# Patient Record
Sex: Female | Born: 1995 | Race: Black or African American | Hispanic: No | Marital: Single | Smoking: Current every day smoker
Health system: Southern US, Community
[De-identification: ages and names within clinical notes are randomized; demographics above are authoritative.]

## PROBLEM LIST (undated history)

## (undated) ENCOUNTER — Inpatient Hospital Stay (HOSPITAL_COMMUNITY): Payer: Self-pay

## (undated) DIAGNOSIS — D573 Sickle-cell trait: Secondary | ICD-10-CM

## (undated) DIAGNOSIS — N83209 Unspecified ovarian cyst, unspecified side: Secondary | ICD-10-CM

## (undated) DIAGNOSIS — D649 Anemia, unspecified: Secondary | ICD-10-CM

## (undated) DIAGNOSIS — F32A Depression, unspecified: Secondary | ICD-10-CM

## (undated) DIAGNOSIS — A549 Gonococcal infection, unspecified: Secondary | ICD-10-CM

## (undated) DIAGNOSIS — A599 Trichomoniasis, unspecified: Secondary | ICD-10-CM

## (undated) DIAGNOSIS — F329 Major depressive disorder, single episode, unspecified: Secondary | ICD-10-CM

## (undated) DIAGNOSIS — A749 Chlamydial infection, unspecified: Secondary | ICD-10-CM

## (undated) DIAGNOSIS — R519 Headache, unspecified: Secondary | ICD-10-CM

## (undated) DIAGNOSIS — N390 Urinary tract infection, site not specified: Secondary | ICD-10-CM

## (undated) HISTORY — DX: Major depressive disorder, single episode, unspecified: F32.9

## (undated) HISTORY — DX: Sickle-cell trait: D57.3

## (undated) HISTORY — DX: Depression, unspecified: F32.A

## (undated) HISTORY — DX: Chlamydial infection, unspecified: A74.9

## (undated) HISTORY — DX: Gonococcal infection, unspecified: A54.9

## (undated) HISTORY — DX: Urinary tract infection, site not specified: N39.0

## (undated) HISTORY — DX: Unspecified ovarian cyst, unspecified side: N83.209

## (undated) HISTORY — PX: NO PAST SURGERIES: SHX2092

## (undated) HISTORY — DX: Trichomoniasis, unspecified: A59.9

---
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
## 2014-03-15 ENCOUNTER — Inpatient Hospital Stay (HOSPITAL_COMMUNITY)
Admission: AD | Admit: 2014-03-15 | Discharge: 2014-03-15 | Disposition: A | Discharge status: Home / Self Care | Payer: Medicaid Other | Source: Ambulatory Visit | Attending: Family Medicine | Admitting: Family Medicine

## 2014-03-15 ENCOUNTER — Encounter (HOSPITAL_COMMUNITY): Payer: Self-pay | Admitting: *Deleted

## 2014-03-15 DIAGNOSIS — N898 Other specified noninflammatory disorders of vagina: Secondary | ICD-10-CM

## 2014-03-15 DIAGNOSIS — F329 Major depressive disorder, single episode, unspecified: Secondary | ICD-10-CM | POA: Insufficient documentation

## 2014-03-15 DIAGNOSIS — O26899 Other specified pregnancy related conditions, unspecified trimester: Secondary | ICD-10-CM

## 2014-03-15 DIAGNOSIS — O99330 Smoking (tobacco) complicating pregnancy, unspecified trimester: Secondary | ICD-10-CM | POA: Insufficient documentation

## 2014-03-15 DIAGNOSIS — O9989 Other specified diseases and conditions complicating pregnancy, childbirth and the puerperium: Principal | ICD-10-CM

## 2014-03-15 DIAGNOSIS — O99891 Other specified diseases and conditions complicating pregnancy: Secondary | ICD-10-CM | POA: Insufficient documentation

## 2014-03-15 DIAGNOSIS — O99019 Anemia complicating pregnancy, unspecified trimester: Secondary | ICD-10-CM | POA: Insufficient documentation

## 2014-03-15 DIAGNOSIS — D573 Sickle-cell trait: Secondary | ICD-10-CM | POA: Insufficient documentation

## 2014-03-15 DIAGNOSIS — F3289 Other specified depressive episodes: Secondary | ICD-10-CM | POA: Insufficient documentation

## 2014-03-15 DIAGNOSIS — O0930 Supervision of pregnancy with insufficient antenatal care, unspecified trimester: Secondary | ICD-10-CM | POA: Insufficient documentation

## 2014-03-15 LAB — URINALYSIS, ROUTINE W REFLEX MICROSCOPIC
Bilirubin Urine: NEGATIVE
Glucose, UA: NEGATIVE mg/dL
Hgb urine dipstick: NEGATIVE
Ketones, ur: NEGATIVE mg/dL
LEUKOCYTES UA: NEGATIVE
NITRITE: NEGATIVE
Protein, ur: NEGATIVE mg/dL
SPECIFIC GRAVITY, URINE: 1.02 (ref 1.005–1.030)
UROBILINOGEN UA: 0.2 mg/dL (ref 0.0–1.0)
pH: 7 (ref 5.0–8.0)

## 2014-03-15 LAB — WET PREP, GENITAL
Clue Cells Wet Prep HPF POC: NONE SEEN
TRICH WET PREP: NONE SEEN
YEAST WET PREP: NONE SEEN

## 2014-03-15 NOTE — MAU Provider Note (Signed)
  History     CSN: [id number]  Arrival date and time: 03/15/14 [id number]   First Provider Initiated Contact with Patient 03/15/14 1033      Chief Complaint  Patient presents with  . Vaginal Discharge   HPI This is a 18yo G2P1001 at 20.2 weeks by LMP.  She was [facility], but discharged due to not following up with appts.  She is seen today for concerns of the baby.  She denies contractions, bleeding, leaking fluid.  She admits to mild fetal movements.  Admits to thick, white, non-odorous vaginal discharge for several days.  Denies pelvic pain, fever, chills, nausea, vomiting.  Did break up with boyfriend for a few weeks and is now back with him.  OB History   Grav Para Term Preterm Abortions TAB SAB Ect Mult Living   2 1 1       1       Past Medical History  Diagnosis Date  . Ovarian cyst 2013  . Depression AGE 22    NO MEDS CURRENTLY  . Urinary tract infection     OCC  . Chlamydia 2010  . Gonorrhea 2010  . Sickle cell trait     Past Surgical History  Procedure Laterality Date  . No past surgeries      Family History  Problem Relation Age of Onset  . Asthma Mother   . Fibromyalgia Mother   . Asthma Brother   . Cancer Maternal Aunt     MOUTH  . Hypertension Maternal Aunt   . Heart disease Maternal Grandmother   . Hyperlipidemia Maternal Grandmother   . Hypertension Maternal Grandmother   . Arthritis Maternal Grandfather   . Diabetes Maternal Grandfather   . Hypertension Maternal Grandfather   . Kidney disease Maternal Grandfather   . Stroke Maternal Grandfather   . Diabetes Paternal Grandmother   . Birth defects Cousin     CLEFT LIP  . Cancer Cousin   . Hypertension Maternal Aunt     History  Substance Use Topics  . Smoking status: Current Every Day Smoker -- 0.25 packs/day    Types: Cigarettes  . Smokeless tobacco: Never Used  . Alcohol Use: Yes     Comment: OCC.  NONE OSINCE PREGNANT    Allergies: No Known Allergies  Prescriptions prior  to admission  Medication Sig Dispense Refill  . acetaminophen (TYLENOL) 325 MG tablet Take 650 mg by mouth every 6 (six) hours as needed.        ROS Physical Exam   Blood pressure 111/63, pulse 73, temperature 97.9 F (36.6 C), temperature source Oral, resp. rate 18, height 5' 0.25" (1.53 m), weight 49.442 kg (109 lb).  Physical Exam  Constitutional: She appears well-developed and well-nourished.  GI: Soft. She exhibits no distension and no mass. There is no tenderness. There is no rebound and no guarding.  Fundus at umbilicus  Genitourinary: Vagina normal.  Cervix normal appearing.  No CMT.  White discharge.  FHT: 143  Wet Prep: WBCs seen.  No clue cells, trichomonas, or Yeast.  MAU Course  Procedures Wet Prep and Gc/Chlamydia obtained.   MDM: I ruled out PID, Trichomonas, yeast vaginitis, BV. Assessment and Plan  1.  Vaginal discharge 2.  Insufficient prenatal care 3.  G2P1001 with IUP at 20.2 weeks  [facility]  Will need US at that appt.  GC/Chlamydia still pending.   Levie HeritageJacob J Stinson, DO 03/15/2014, 10:33 AM

## 2014-03-15 NOTE — Discharge Instructions (Signed)
Preterm Labor Information Preterm labor is when labor starts at less than 37 weeks of pregnancy. The normal length of a pregnancy is 39 to 41 weeks. CAUSES Often, there is no identifiable underlying cause as to why a woman goes into preterm labor. One of the most common known causes of preterm labor is infection. Infections of the uterus, cervix, vagina, amniotic sac, bladder, kidney, or even the lungs (pneumonia) can cause labor to start. Other suspected causes of preterm labor include:   Urogenital infections, such as yeast infections and bacterial vaginosis.   Uterine abnormalities (uterine shape, uterine septum, fibroids, or bleeding from the placenta).   A cervix that has been operated on (it may fail to stay closed).   Malformations in the fetus.   Multiple gestations (twins, triplets, and so on).   Breakage of the amniotic sac.  RISK FACTORS  Having a previous history of preterm labor.   Having premature rupture of membranes (PROM).   Having a placenta that covers the opening of the cervix (placenta previa).   Having a placenta that separates from the uterus (placental abruption).   Having a cervix that is too weak to hold the fetus in the uterus (incompetent cervix).   Having too much fluid in the amniotic sac (polyhydramnios).   Taking illegal drugs or smoking while pregnant.   Not gaining enough weight while pregnant.   Being younger than 18 and older than 19 years old.   Having a low socioeconomic status.   Being African American. SYMPTOMS Signs and symptoms of preterm labor include:   Menstrual-like cramps, abdominal pain, or back pain.  Uterine contractions that are regular, as frequent as six in an hour, regardless of their intensity (may be mild or painful).  Contractions that start on the top of the uterus and spread down to the lower abdomen and back.   A sense of increased pelvic pressure.   A watery or bloody mucus discharge that  comes from the vagina.  TREATMENT Depending on the length of the pregnancy and other circumstances, your health care provider may suggest bed rest. If necessary, there are medicines that can be given to stop contractions and to mature the fetal lungs. If labor happens before 34 weeks of pregnancy, a prolonged hospital stay may be recommended. Treatment depends on the condition of both you and the fetus.  WHAT SHOULD YOU DO IF YOU THINK YOU ARE IN PRETERM LABOR? Call your health care provider right away. You will need to go to the hospital to get checked immediately. HOW CAN YOU PREVENT PRETERM LABOR IN FUTURE PREGNANCIES? You should:   Stop smoking if you smoke.  Maintain healthy weight gain and avoid chemicals and drugs that are not necessary.  Be watchful for any type of infection.  Inform your health care provider if you have a known history of preterm labor. Document Released: 02/14/2004 Document Revised: 07/27/2013 Document Reviewed: 12/27/2012 ExitCare Patient Information 2014 [employer].    

## 2014-03-15 NOTE — MAU Note (Signed)
Vag d/c and irritation- that is why she came in . No bleeding or leaking, no pain.  Has not seen dr in 2 months- only had one visit... "has been really stressed, wasn't planning to keep preg, but was too far along"

## 2014-03-15 NOTE — MAU Note (Signed)
C/o not being able to have any prenatal care for past 2 months; wondering if she is having a boy or girl; has not had an anatomy scan; denies any pain but having some vaginal discharge; requesting a STD check;

## 2014-03-16 ENCOUNTER — Telehealth: Payer: Self-pay

## 2014-03-16 DIAGNOSIS — A749 Chlamydial infection, unspecified: Secondary | ICD-10-CM

## 2014-03-16 LAB — GC/CHLAMYDIA PROBE AMP
CT PROBE, AMP APTIMA: POSITIVE — AB
GC Probe RNA: NEGATIVE

## 2014-03-16 MED ORDER — AZITHROMYCIN 500 MG PO TABS: 1000.0000 mg | ORAL_TABLET | Freq: Every day | ORAL | Status: DC

## 2014-03-16 NOTE — Telephone Encounter (Signed)
Called pt. And informed her of Zithromax sent to her pharmacy. Advised her not to have sex until both she and her partner are treated. Also advised her to take medication on a full stomach to avoid getting sick as pt. Reports getting sick after taking medication. Pt. Verbalized understanding and has no further questions or concerns.

## 2014-03-16 NOTE — Telephone Encounter (Signed)
Message copied by Louanna RawAMPBELL, Madine Sarr M on Thu Mar 16, 2014 11:46 AM ------      Message from: Pennie BanterSMITH, MARNI W      Created: Thu Mar 16, 2014  9:36 AM       Telephone call to patient regarding positive chlamydia culture, patient notified.  Patient has not been treated and will need Rx called in per protocol to [facility] [address]  Instructed patient to notify her partner for treatment.  Report faxed to health department. ------

## 2014-04-04 ENCOUNTER — Encounter: Payer: Self-pay | Admitting: Obstetrics & Gynecology

## 2014-04-04 ENCOUNTER — Ambulatory Visit (INDEPENDENT_AMBULATORY_CARE_PROVIDER_SITE_OTHER): Payer: Medicaid Other | Admitting: Obstetrics & Gynecology

## 2014-04-04 VITALS — BP 108/70 | HR 80 | Temp 97.0°F | Wt 112.7 lb

## 2014-04-04 DIAGNOSIS — N898 Other specified noninflammatory disorders of vagina: Secondary | ICD-10-CM

## 2014-04-04 DIAGNOSIS — F1290 Cannabis use, unspecified, uncomplicated: Secondary | ICD-10-CM

## 2014-04-04 DIAGNOSIS — A5901 Trichomonal vulvovaginitis: Secondary | ICD-10-CM

## 2014-04-04 DIAGNOSIS — A749 Chlamydial infection, unspecified: Secondary | ICD-10-CM | POA: Insufficient documentation

## 2014-04-04 DIAGNOSIS — F1910 Other psychoactive substance abuse, uncomplicated: Secondary | ICD-10-CM

## 2014-04-04 DIAGNOSIS — O26892 Other specified pregnancy related conditions, second trimester: Secondary | ICD-10-CM

## 2014-04-04 DIAGNOSIS — O99320 Drug use complicating pregnancy, unspecified trimester: Secondary | ICD-10-CM

## 2014-04-04 DIAGNOSIS — O99340 Other mental disorders complicating pregnancy, unspecified trimester: Secondary | ICD-10-CM

## 2014-04-04 DIAGNOSIS — O98819 Other maternal infectious and parasitic diseases complicating pregnancy, unspecified trimester: Secondary | ICD-10-CM

## 2014-04-04 DIAGNOSIS — A599 Trichomoniasis, unspecified: Secondary | ICD-10-CM

## 2014-04-04 DIAGNOSIS — O98812 Other maternal infectious and parasitic diseases complicating pregnancy, second trimester: Secondary | ICD-10-CM

## 2014-04-04 DIAGNOSIS — O0930 Supervision of pregnancy with insufficient antenatal care, unspecified trimester: Secondary | ICD-10-CM

## 2014-04-04 DIAGNOSIS — O99330 Smoking (tobacco) complicating pregnancy, unspecified trimester: Secondary | ICD-10-CM

## 2014-04-04 DIAGNOSIS — F1210 Cannabis abuse, uncomplicated: Secondary | ICD-10-CM

## 2014-04-04 DIAGNOSIS — O9989 Other specified diseases and conditions complicating pregnancy, childbirth and the puerperium: Secondary | ICD-10-CM

## 2014-04-04 LAB — POCT URINALYSIS DIP (DEVICE)
Bilirubin Urine: NEGATIVE
Glucose, UA: NEGATIVE mg/dL
Hgb urine dipstick: NEGATIVE
Ketones, ur: NEGATIVE mg/dL
Nitrite: NEGATIVE
Protein, ur: NEGATIVE mg/dL
Specific Gravity, Urine: 1.02 (ref 1.005–1.030)
Urobilinogen, UA: 0.2 mg/dL (ref 0.0–1.0)
pH: 7 (ref 5.0–8.0)

## 2014-04-04 LAB — OB RESULTS CONSOLE GC/CHLAMYDIA
Chlamydia: NEGATIVE
Gonorrhea: NEGATIVE

## 2014-04-04 NOTE — Patient Instructions (Addendum)
Return to clinic for any obstetric concerns or go to MAU for evaluation Tetanus, Diphtheria, Pertussis (Tdap) Vaccine What You Need to Know WHY GET VACCINATED? Tetanus, diphtheria and pertussis can be very serious diseases, even for adolescents and adults. Tdap vaccine can protect us from these diseases. TETANUS (Lockjaw) causes painful muscle tightening and stiffness, usually all over the body.  It can lead to tightening of muscles in the head and neck so you can't open your mouth, swallow, or sometimes even breathe. Tetanus kills about 1 out of 5 people who are infected. DIPHTHERIA can cause a thick coating to form in the back of the throat.  It can lead to breathing problems, paralysis, heart failure, and death. PERTUSSIS (Whooping Cough) causes severe coughing spells, which can cause difficulty breathing, vomiting and disturbed sleep.  It can also lead to weight loss, incontinence, and rib fractures. Up to 2 in 100 adolescents and 5 in 100 adults with pertussis are hospitalized or have complications, which could include pneumonia and death. These diseases are caused by bacteria. Diphtheria and pertussis are spread from person to person through coughing or sneezing. Tetanus enters the body through cuts, scratches, or wounds. Before vaccines, the [place] saw as many as 200,000 cases a year of diphtheria and pertussis, and hundreds of cases of tetanus. Since vaccination began, tetanus and diphtheria have dropped by about 99% and pertussis by about 80%. TDAP VACCINE Tdap vaccine can protect adolescents and adults from tetanus, diphtheria, and pertussis. One dose of Tdap is routinely given at age 11 or 12. People who did not get Tdap at that age should get it as soon as possible. Tdap is especially important for health care professionals and anyone having close contact with a baby younger than 12 months. Pregnant women should get a dose of Tdap during every pregnancy, to protect the newborn  from pertussis. Infants are most at risk for severe, life-threatening complications from pertussis. A similar vaccine, called Td, protects from tetanus and diphtheria, but not pertussis. A Td booster should be given every 10 years. Tdap may be given as one of these boosters if you have not already gotten a dose. Tdap may also be given after a severe cut or burn to prevent tetanus infection. Your doctor can give you more information. Tdap may safely be given at the same time as other vaccines. SOME PEOPLE SHOULD NOT GET THIS VACCINE  If you ever had a life-threatening allergic reaction after a dose of any tetanus, diphtheria, or pertussis containing vaccine, OR if you have a severe allergy to any part of this vaccine, you should not get Tdap. Tell your doctor if you have any severe allergies.  If you had a coma, or long or multiple seizures within 7 days after a childhood dose of DTP or DTaP, you should not get Tdap, unless a cause other than the vaccine was found. You can still get Td.  Talk to your doctor if you:  have epilepsy or another nervous system problem,  had severe pain or swelling after any vaccine containing diphtheria, tetanus or pertussis,  ever had Guillain-Barr Syndrome (GBS),  aren't feeling well on the day the shot is scheduled. RISKS OF A VACCINE REACTION With any medicine, including vaccines, there is a chance of side effects. These are usually mild and go away on their own, but serious reactions are also possible. Brief fainting spells can follow a vaccination, leading to injuries from falling. Sitting or lying down for about 15 minutes can   help prevent these. Tell your doctor if you feel dizzy or light-headed, or have vision changes or ringing in the ears. Mild problems following Tdap (Did not interfere with activities)  Pain where the shot was given (about 3 in 4 adolescents or 2 in 3 adults)  Redness or swelling where the shot was given (about 1 person in 5)  Mild  fever of at least 100.4F (up to about 1 in 25 adolescents or 1 in 100 adults)  Headache (about 3 or 4 people in 10)  Tiredness (about 1 person in 3 or 4)  Nausea, vomiting, diarrhea, stomach ache (up to 1 in 4 adolescents or 1 in 10 adults)  Chills, body aches, sore joints, rash, swollen glands (uncommon) Moderate problems following Tdap (Interfered with activities, but did not require medical attention)  Pain where the shot was given (about 1 in 5 adolescents or 1 in 100 adults)  Redness or swelling where the shot was given (up to about 1 in 16 adolescents or 1 in 25 adults)  Fever over 102F (about 1 in 100 adolescents or 1 in 250 adults)  Headache (about 3 in 20 adolescents or 1 in 10 adults)  Nausea, vomiting, diarrhea, stomach ache (up to 1 or 3 people in 100)  Swelling of the entire arm where the shot was given (up to about 3 in 100). Severe problems following Tdap (Unable to perform usual activities, required medical attention)  Swelling, severe pain, bleeding and redness in the arm where the shot was given (rare). A severe allergic reaction could occur after any vaccine (estimated less than 1 in a million doses). WHAT IF THERE IS A SERIOUS REACTION? What should I look for?  Look for anything that concerns you, such as signs of a severe allergic reaction, very high fever, or behavior changes. Signs of a severe allergic reaction can include hives, swelling of the face and throat, difficulty breathing, a fast heartbeat, dizziness, and weakness. These would start a few minutes to a few hours after the vaccination. What should I do?  If you think it is a severe allergic reaction or other emergency that can't wait, call 9-1-1 or get the person to the nearest hospital. Otherwise, call your doctor.  Afterward, the reaction should be reported to the "Vaccine Adverse Event Reporting System" (VAERS). Your doctor might file this report, or you can do it yourself through the VAERS web  site at www.vaers.hhs.gov, or by calling 1-800-822-7967. VAERS is only for reporting reactions. They do not give medical advice.  THE NATIONAL VACCINE INJURY COMPENSATION PROGRAM The National Vaccine Injury Compensation Program (VICP) is a federal program that was created to compensate people who may have been injured by certain vaccines. Persons who believe they may have been injured by a vaccine can learn about the program and about filing a claim by calling 1-800-338-2382 or visiting the VICP website at www.hrsa.gov/vaccinecompensation. HOW CAN I LEARN MORE?  Ask your doctor.  Call your local or state health department.  Contact the Centers for Disease Control and Prevention (CDC):  Call 1-800-232-4636 or visit CDC's website at www.cdc.gov/vaccines. CDC Tdap Vaccine VIS (04/15/12) Document Released: 05/25/2012 Document Revised: 03/21/2013 Document Reviewed: 03/16/2013 ExitCare Patient Information 2014 [employer]Care, [employer].  

## 2014-04-04 NOTE — Progress Notes (Signed)
Subjective:    Julia Roman is being seen today for her first obstetrical visit.  This is not a planned pregnancy. She is at 2470w1d gestation by LMP. Her obstetrical history is significant for previous term SVD at age 18. Patient does not intend to breast feed. Pregnancy history fully reviewed. Of note, patient had a positive Chlamydia culture on 03/15/14; was treated with Azithromycin. Still reports yellow vaginal discharge, says she did not have sex with her previous partner as he did not get treated.    OB History  Gravida Para Term Preterm AB SAB TAB Ectopic Multiple Living  2 1 1       1     # Outcome Date GA Lbr Len/2nd Weight Sex Delivery Anes PTL Lv  2 CUR           1 TRM 08/11/13 7012w0d 03:23 / 00:26 4 lb 15.7 oz (2.26 kg) M SVD EPI  Y    History of Chlamydia and Trichomonas  The following portions of the patient's history were reviewed and updated as appropriate: allergies, current medications, past family history, past medical history, past social history, past surgical history and problem list.  Review of Systems Pertinent items are noted in HPI.    Objective:   BP 108/70  Pulse 80  Temp(Src) 97 F (36.1 C)  Wt 112 lb 11.2 oz (51.12 kg) GENERAL: Well-developed, well-nourished female in no acute distress.  HEENT: Normocephalic, atraumatic. Sclerae anicteric.  NECK: Supple. Normal thyroid.  LUNGS: Clear to auscultation bilaterally.  HEART: Regular rate and rhythm. BREASTS: Symmetric in size. No masses, skin changes, nipple drainage, or lymphadenopathy. ABDOMEN: Soft, gravid, nontender, nondistended. No organomegaly. PELVIC: Normal external female genitalia. Vagina is pink and rugated.  Small amount of yellow discharge. Normal multiparous cervix contour.Cultures obtained. No CMT. No adnexal mass or tenderness.  EXTREMITIES: No cyanosis, clubbing, or edema, 2+ distal pulses.   Assessment:   Pregnancy at 7270w1d Patient Active Problem List   Diagnosis Date Noted  .  Chlamydia infection complicating pregnancy in second trimester 04/04/2014  . Current smoker 02/03/2013  . Marijuana use 02/03/2013  . History of depression 02/03/2013  . Sickle cell trait 02/03/2013  . Late prenatal care 01/27/2013     Plan:   Initial labs drawn. Will follow up GC/Chlam, wet prep cultures and manage accordingly. Continue prenatal vitamins. Problem list reviewed and updated. Role of ultrasound in pregnancy discussed; fetal survey: ordered. Follow up in 4 weeks; will get third trimester labs, Tdap then Routine obstetric precautions reviewed.  Jaynie CollinsUGONNA  ANYANWU, MD, FACOG Attending Obstetrician & Gynecologist Faculty Practice, [facility]

## 2014-04-04 NOTE — Progress Notes (Signed)
C/o of yellow discharge and itching. Took Zithromax for Chlamydia treatment but states she does not think it helped.  New OB labs today. Declines flu vaccine.  New OB packet given. Appropriate weight gain discussed (25-35). Pt. Verbalized understanding.

## 2014-04-05 ENCOUNTER — Telehealth: Payer: Self-pay

## 2014-04-05 ENCOUNTER — Encounter: Payer: Self-pay | Admitting: Obstetrics & Gynecology

## 2014-04-05 DIAGNOSIS — O358XX0 Maternal care for other (suspected) fetal abnormality and damage, not applicable or unspecified: Secondary | ICD-10-CM

## 2014-04-05 DIAGNOSIS — O99330 Smoking (tobacco) complicating pregnancy, unspecified trimester: Secondary | ICD-10-CM | POA: Insufficient documentation

## 2014-04-05 DIAGNOSIS — O99320 Drug use complicating pregnancy, unspecified trimester: Secondary | ICD-10-CM | POA: Insufficient documentation

## 2014-04-05 DIAGNOSIS — A5901 Trichomonal vulvovaginitis: Secondary | ICD-10-CM | POA: Insufficient documentation

## 2014-04-05 DIAGNOSIS — O23592 Infection of other part of genital tract in pregnancy, second trimester: Secondary | ICD-10-CM

## 2014-04-05 LAB — WET PREP, GENITAL
Clue Cells Wet Prep HPF POC: NONE SEEN
YEAST WET PREP: NONE SEEN

## 2014-04-05 LAB — OBSTETRIC PANEL
ANTIBODY SCREEN: NEGATIVE
BASOS PCT: 0 % (ref 0–1)
Basophils Absolute: 0 10*3/uL (ref 0.0–0.1)
EOS ABS: 0.2 10*3/uL (ref 0.0–0.7)
EOS PCT: 2 % (ref 0–5)
HEMATOCRIT: 30.6 % — AB (ref 36.0–46.0)
HEMOGLOBIN: 10.7 g/dL — AB (ref 12.0–15.0)
Hepatitis B Surface Ag: NEGATIVE
LYMPHS ABS: 2.1 10*3/uL (ref 0.7–4.0)
Lymphocytes Relative: 25 % (ref 12–46)
MCH: 27.2 pg (ref 26.0–34.0)
MCHC: 35 g/dL (ref 30.0–36.0)
MCV: 77.7 fL — AB (ref 78.0–100.0)
MONO ABS: 0.8 10*3/uL (ref 0.1–1.0)
Monocytes Relative: 9 % (ref 3–12)
Neutro Abs: 5.4 10*3/uL (ref 1.7–7.7)
Neutrophils Relative %: 64 % (ref 43–77)
Platelets: 228 10*3/uL (ref 150–400)
RBC: 3.94 MIL/uL (ref 3.87–5.11)
RDW: 14.8 % (ref 11.5–15.5)
RH TYPE: POSITIVE
Rubella: 1.82 Index — ABNORMAL HIGH (ref <0.90)
WBC: 8.4 10*3/uL (ref 4.0–10.5)

## 2014-04-05 LAB — HIV ANTIBODY (ROUTINE TESTING W REFLEX): HIV: NONREACTIVE

## 2014-04-05 LAB — GC/CHLAMYDIA PROBE AMP
CT Probe RNA: NEGATIVE
GC Probe RNA: NEGATIVE

## 2014-04-05 MED ORDER — METRONIDAZOLE 500 MG PO TABS: ORAL_TABLET | ORAL | Status: DC

## 2014-04-05 NOTE — Telephone Encounter (Signed)
Called pt and informed pt of + trich and that flagyl has been sent to her [facility] pharmacy off of [address].  Pt informed me that medication can be harsh on her stomach.  I advised pt to take two tablets in the am and then two tablets at night on the same day and maybe that will help tolerate it better.   I advised pt to have partner(s) treated as well they can go to Urgent Care or [facility] for treatment.  I would also abstain from intercourse until you know that her partner has been treated.  Pt stated understanding.

## 2014-04-05 NOTE — Progress Notes (Signed)
Lab Addendum (from labs on 04/05/14):    Hepatitis B Surface Ag NEGATIVE    RPR NON REAC    HIV 1&2 Ab, 4th Generation NONREACTIVE    CT Probe RNA NEGATIVE    GC Probe RNA NEGATIVE    Yeast Wet Prep HPF POC NONE SEEN    Trich, Wet Prep MANY (*)   Clue Cells Wet Prep HPF POC NONE SEEN    WBC, Wet Prep HPF POC TNTC (*)  Metronidazole prescribed for Trichomonas. Patient to be contacted and will be told of results, will also need to let partner(s) know so the partner(s) can get testing and treatment. No unprotected intercourse will be advised until she and her partner(s) are treated.

## 2014-04-05 NOTE — Addendum Note (Signed)
Addended by: Jaynie CollinsANYANWU, Jessalyn Hinojosa A on: 04/05/2014 10:41 AM   Modules accepted: Orders

## 2014-04-06 LAB — PRESCRIPTION MONITORING PROFILE (19 PANEL)
Amphetamine/Meth: NEGATIVE ng/mL
Barbiturate Screen, Urine: NEGATIVE ng/mL
Benzodiazepine Screen, Urine: NEGATIVE ng/mL
Buprenorphine, Urine: NEGATIVE ng/mL
CARISOPRODOL, URINE: NEGATIVE ng/mL
COCAINE METABOLITES: NEGATIVE ng/mL
Creatinine, Urine: 189.97 mg/dL (ref >20.0)
ECSTASY: NEGATIVE ng/mL
FENTANYL URINE: NEGATIVE ng/mL
MEPERIDINE UR: NEGATIVE ng/mL
METHADONE SCREEN, URINE: NEGATIVE ng/mL
METHAQUALONE SCREEN (URINE): NEGATIVE ng/mL
NITRITES URINE, INITIAL: NEGATIVE ug/mL
Opiate Screen, Urine: NEGATIVE ng/mL
Oxycodone Screen, Ur: NEGATIVE ng/mL
PH URINE, INITIAL: 7.5 pH (ref 4.5–8.9)
PROPOXYPHENE: NEGATIVE ng/mL
Phencyclidine, Ur: NEGATIVE ng/mL
TRAMADOL UR: NEGATIVE ng/mL
Tapentadol, urine: NEGATIVE ng/mL
ZOLPIDEM, URINE: NEGATIVE ng/mL

## 2014-04-06 LAB — CULTURE, OB URINE

## 2014-04-06 LAB — CANNABANOIDS (GC/LC/MS), URINE: THC-COOH (GC/LC/MS), ur confirm: 610 ng/mL — AB

## 2014-04-07 ENCOUNTER — Ambulatory Visit (HOSPITAL_COMMUNITY)
Admission: RE | Admit: 2014-04-07 | Discharge: 2014-04-07 | Disposition: A | Discharge status: Home / Self Care | Payer: Medicaid Other | Source: Ambulatory Visit | Attending: Obstetrics & Gynecology | Admitting: Obstetrics & Gynecology

## 2014-04-07 ENCOUNTER — Ambulatory Visit (HOSPITAL_COMMUNITY): Payer: Medicaid Other

## 2014-04-07 DIAGNOSIS — Z3689 Encounter for other specified antenatal screening: Secondary | ICD-10-CM | POA: Insufficient documentation

## 2014-04-07 DIAGNOSIS — O0930 Supervision of pregnancy with insufficient antenatal care, unspecified trimester: Secondary | ICD-10-CM | POA: Insufficient documentation

## 2014-04-07 LAB — HEMOGLOBINOPATHY EVALUATION
HGB A: 61.7 % — AB (ref 96.8–97.8)
HGB F QUANT: 0 % (ref 0.0–2.0)
Hgb A2 Quant: 3.2 % (ref 2.2–3.2)
Hgb S Quant: 0 %

## 2014-04-11 ENCOUNTER — Telehealth: Payer: Self-pay

## 2014-04-11 ENCOUNTER — Encounter: Payer: Self-pay | Admitting: Obstetrics & Gynecology

## 2014-04-11 DIAGNOSIS — O358XX0 Maternal care for other (suspected) fetal abnormality and damage, not applicable or unspecified: Secondary | ICD-10-CM | POA: Insufficient documentation

## 2014-04-11 NOTE — Telephone Encounter (Signed)
Follow up US scheduled for 05/05/14 Friday at 1:15pm in [facility]. Called pt. And informed her of appointment date, time and location. No further questions or concerns.

## 2014-04-11 NOTE — Addendum Note (Signed)
Addended by: Jaynie CollinsANYANWU, UGONNA A on: 04/11/2014 11:38 AM   Modules accepted: Orders

## 2014-04-11 NOTE — Telephone Encounter (Signed)
Message copied by Louanna RawAMPBELL, Sriram Febles M on Tue Apr 11, 2014 11:42 AM ------      Message from: Jaynie CollinsANYANWU, UGONNA A      Created: Tue Apr 11, 2014 11:36 AM       04/07/14 scan showed B pyelecatsis R 6.523mm L 7mm. Schedule follow scan at [facility] in 3-4 weeks. ------

## 2014-04-11 NOTE — Telephone Encounter (Signed)
Message copied by Louanna RawAMPBELL, Jadda Hunsucker M on Tue Apr 11, 2014 11:42 AM ------      Message from: Jaynie CollinsANYANWU, UGONNA A      Created: Tue Apr 11, 2014 11:38 AM       [facility] follow up pyelectasis ------

## 2014-04-19 ENCOUNTER — Encounter (HOSPITAL_COMMUNITY): Payer: Self-pay

## 2014-04-19 ENCOUNTER — Inpatient Hospital Stay (HOSPITAL_COMMUNITY)
Admission: AD | Admit: 2014-04-19 | Discharge: 2014-04-21 | DRG: 775 | Disposition: A | Discharge status: Home / Self Care | Payer: Medicaid Other | Source: Ambulatory Visit | Attending: Obstetrics & Gynecology | Admitting: Obstetrics & Gynecology

## 2014-04-19 DIAGNOSIS — O09219 Supervision of pregnancy with history of pre-term labor, unspecified trimester: Secondary | ICD-10-CM

## 2014-04-19 DIAGNOSIS — Z8249 Family history of ischemic heart disease and other diseases of the circulatory system: Secondary | ICD-10-CM

## 2014-04-19 DIAGNOSIS — Z833 Family history of diabetes mellitus: Secondary | ICD-10-CM

## 2014-04-19 DIAGNOSIS — Z823 Family history of stroke: Secondary | ICD-10-CM

## 2014-04-19 DIAGNOSIS — O99334 Smoking (tobacco) complicating childbirth: Secondary | ICD-10-CM | POA: Diagnosis present

## 2014-04-19 DIAGNOSIS — O358XX0 Maternal care for other (suspected) fetal abnormality and damage, not applicable or unspecified: Principal | ICD-10-CM | POA: Diagnosis present

## 2014-04-19 DIAGNOSIS — O0930 Supervision of pregnancy with insufficient antenatal care, unspecified trimester: Secondary | ICD-10-CM

## 2014-04-19 DIAGNOSIS — Z8751 Personal history of pre-term labor: Secondary | ICD-10-CM

## 2014-04-19 LAB — CBC
HEMATOCRIT: 32.2 % — AB (ref 36.0–46.0)
HEMOGLOBIN: 11.6 g/dL — AB (ref 12.0–15.0)
MCH: 27.8 pg (ref 26.0–34.0)
MCHC: 36 g/dL (ref 30.0–36.0)
MCV: 77 fL — ABNORMAL LOW (ref 78.0–100.0)
Platelets: 235 10*3/uL (ref 150–400)
RBC: 4.18 MIL/uL (ref 3.87–5.11)
RDW: 13.6 % (ref 11.5–15.5)
WBC: 10.7 10*3/uL — ABNORMAL HIGH (ref 4.0–10.5)

## 2014-04-19 LAB — RPR

## 2014-04-19 LAB — GROUP B STREP BY PCR: GROUP B STREP BY PCR: POSITIVE — AB

## 2014-04-19 MED ORDER — OXYCODONE-ACETAMINOPHEN 5-325 MG PO TABS
1.0000 | ORAL_TABLET | ORAL | Status: DC | PRN
  Administered 2014-04-19: 2 via ORAL
  Filled 2014-04-19: qty 2

## 2014-04-19 MED ORDER — PRENATAL MULTIVITAMIN CH
1.0000 | ORAL_TABLET | Freq: Every day | ORAL | Status: DC
  Administered 2014-04-19 – 2014-04-21 (×3): 1 via ORAL
  Filled 2014-04-19 (×3): qty 1

## 2014-04-19 MED ORDER — SIMETHICONE 80 MG PO CHEW: 80.0000 mg | CHEWABLE_TABLET | ORAL | Status: DC | PRN

## 2014-04-19 MED ORDER — DIBUCAINE 1 % RE OINT: 1.0000 "application " | TOPICAL_OINTMENT | RECTAL | Status: DC | PRN

## 2014-04-19 MED ORDER — BETAMETHASONE SOD PHOS & ACET 6 (3-3) MG/ML IJ SUSP
12.0000 mg | Freq: Once | INTRAMUSCULAR | Status: AC
  Administered 2014-04-19: 12 mg via INTRAMUSCULAR
  Filled 2014-04-19: qty 2

## 2014-04-19 MED ORDER — WITCH HAZEL-GLYCERIN EX PADS: 1.0000 "application " | MEDICATED_PAD | CUTANEOUS | Status: DC | PRN

## 2014-04-19 MED ORDER — PNEUMOCOCCAL VAC POLYVALENT 25 MCG/0.5ML IJ INJ
0.5000 mL | INJECTION | INTRAMUSCULAR | Status: DC
Start: 2014-04-20 — End: 2014-04-21
  Filled 2014-04-19: qty 0.5

## 2014-04-19 MED ORDER — ONDANSETRON HCL 4 MG PO TABS: 4.0000 mg | ORAL_TABLET | ORAL | Status: DC | PRN

## 2014-04-19 MED ORDER — LANOLIN HYDROUS EX OINT: TOPICAL_OINTMENT | CUTANEOUS | Status: DC | PRN

## 2014-04-19 MED ORDER — IBUPROFEN 600 MG PO TABS
600.0000 mg | ORAL_TABLET | Freq: Four times a day (QID) | ORAL | Status: DC
  Administered 2014-04-19 – 2014-04-21 (×8): 600 mg via ORAL
  Filled 2014-04-19 (×8): qty 1

## 2014-04-19 MED ORDER — FENTANYL CITRATE 0.05 MG/ML IJ SOLN: 50.0000 ug | INTRAMUSCULAR | Status: DC | PRN

## 2014-04-19 MED ORDER — ONDANSETRON HCL 4 MG/2ML IJ SOLN
4.0000 mg | INTRAMUSCULAR | Status: DC | PRN
Start: 2014-04-19 — End: 2014-04-21

## 2014-04-19 MED ORDER — ONDANSETRON HCL 4 MG/2ML IJ SOLN: 4.0000 mg | Freq: Four times a day (QID) | INTRAMUSCULAR | Status: DC | PRN

## 2014-04-19 MED ORDER — TETANUS-DIPHTH-ACELL PERTUSSIS 5-2.5-18.5 LF-MCG/0.5 IM SUSP: 0.5000 mL | Freq: Once | INTRAMUSCULAR | Status: DC

## 2014-04-19 MED ORDER — BENZOCAINE-MENTHOL 20-0.5 % EX AERO: 1.0000 "application " | INHALATION_SPRAY | CUTANEOUS | Status: DC | PRN

## 2014-04-19 MED ORDER — OXYTOCIN BOLUS FROM INFUSION
500.0000 mL | INTRAVENOUS | Status: DC
  Administered 2014-04-19: 500 mL via INTRAVENOUS

## 2014-04-19 MED ORDER — OXYCODONE-ACETAMINOPHEN 5-325 MG PO TABS: 1.0000 | ORAL_TABLET | ORAL | Status: DC | PRN

## 2014-04-19 MED ORDER — MAGNESIUM SULFATE 40 G IN LACTATED RINGERS - SIMPLE
2.0000 g/h | INTRAVENOUS | Status: DC
  Filled 2014-04-19: qty 500

## 2014-04-19 MED ORDER — CITRIC ACID-SODIUM CITRATE 334-500 MG/5ML PO SOLN: 30.0000 mL | ORAL | Status: DC | PRN

## 2014-04-19 MED ORDER — LACTATED RINGERS IV SOLN: 500.0000 mL | INTRAVENOUS | Status: DC | PRN

## 2014-04-19 MED ORDER — SODIUM CHLORIDE 0.9 % IV SOLN
2.0000 g | Freq: Once | INTRAVENOUS | Status: AC
  Administered 2014-04-19: 2 g via INTRAVENOUS
  Filled 2014-04-19: qty 2000

## 2014-04-19 MED ORDER — IBUPROFEN 600 MG PO TABS: 600.0000 mg | ORAL_TABLET | Freq: Four times a day (QID) | ORAL | Status: DC | PRN

## 2014-04-19 MED ORDER — OXYTOCIN 40 UNITS IN LACTATED RINGERS INFUSION - SIMPLE MED
62.5000 mL/h | INTRAVENOUS | Status: DC
  Filled 2014-04-19: qty 1000

## 2014-04-19 MED ORDER — METRONIDAZOLE 500 MG PO TABS
2000.0000 mg | ORAL_TABLET | Freq: Once | ORAL | Status: AC
  Administered 2014-04-19: 2000 mg via ORAL
  Filled 2014-04-19: qty 4

## 2014-04-19 MED ORDER — ZOLPIDEM TARTRATE 5 MG PO TABS: 5.0000 mg | ORAL_TABLET | Freq: Every evening | ORAL | Status: DC | PRN

## 2014-04-19 MED ORDER — ACETAMINOPHEN 325 MG PO TABS
650.0000 mg | ORAL_TABLET | ORAL | Status: DC | PRN
Start: 2014-04-19 — End: 2014-04-19

## 2014-04-19 MED ORDER — LIDOCAINE HCL (PF) 1 % IJ SOLN
30.0000 mL | INTRAMUSCULAR | Status: DC | PRN
  Filled 2014-04-19: qty 30

## 2014-04-19 MED ORDER — LACTATED RINGERS IV SOLN: INTRAVENOUS | Status: DC

## 2014-04-19 MED ORDER — DIPHENHYDRAMINE HCL 25 MG PO CAPS: 25.0000 mg | ORAL_CAPSULE | Freq: Four times a day (QID) | ORAL | Status: DC | PRN

## 2014-04-19 MED ORDER — MEASLES, MUMPS & RUBELLA VAC ~~LOC~~ INJ: 0.5000 mL | INJECTION | Freq: Once | SUBCUTANEOUS | Status: DC

## 2014-04-19 MED ORDER — MAGNESIUM SULFATE BOLUS VIA INFUSION
4.0000 g | Freq: Once | INTRAVENOUS | Status: AC
  Administered 2014-04-19: 4 g via INTRAVENOUS
  Filled 2014-04-19: qty 500

## 2014-04-19 MED ORDER — SENNOSIDES-DOCUSATE SODIUM 8.6-50 MG PO TABS
2.0000 | ORAL_TABLET | ORAL | Status: DC
  Administered 2014-04-19 – 2014-04-20 (×2): 2 via ORAL
  Filled 2014-04-19 (×2): qty 2

## 2014-04-19 NOTE — H&P (Signed)
Chief Complaint:  Contractions   First Provider Initiated Contact with Patient 04/19/2014 at 0420.  HPI: Julia Roman is a 18 y.o. G2P1001 at 8528w2d who presents to maternity admissions reporting contractions since 3 AM and rectal pressure. Denies leakage of fluid or vaginal bleeding. Good fetal movement.   [facility] No other prenatal care.  Patient Active Problem List   Diagnosis Date Noted  . Preterm labor 04/19/2014  . Fetal bilateral pyelectasis; antepartum 04/11/2014  . Marijuana use complicating pregnancy 04/05/2014  . Smoking complicating pregnancy 04/05/2014  . Trichomonal vaginitis in pregnancy in second trimester   . Chlamydia infection complicating pregnancy in second trimester 04/04/2014  . Current smoker 02/03/2013  . Marijuana use 02/03/2013  . History of depression 02/03/2013  . 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 Late prenatal care 01/27/2013   Limited prenatal care Close pregnancy spacing   Past Medical History: Past Medical History  Diagnosis Date  . Ovarian cyst 2013  . Depression AGE 38    NO MEDS CURRENTLY  . Urinary tract infection     OCC  . Chlamydia 2010  . Gonorrhea 2010  . Sickle cell trait   . Trichomonas vaginalis infection 4/282015    Past obstetric history: OB History  Gravida Para Term Preterm AB SAB TAB Ectopic Multiple Living  2 1 1       1     # Outcome Date GA Lbr Len/2nd Weight Sex Delivery Anes PTL Lv  2 CUR           1 TRM 08/11/13 261w0d 03:23 / 00:26 2.26 kg (4 lb 15.7 oz) M SVD EPI  Y      Past Surgical History: Past Surgical History  Procedure Laterality Date  . No past surgeries       Family History: Family History  Problem Relation Age of Onset  . Asthma Mother   . Fibromyalgia Mother   . Asthma Brother   . Cancer Maternal Aunt     MOUTH  . Hypertension Maternal Aunt   . Heart disease Maternal Grandmother   . Hyperlipidemia Maternal Grandmother   .  Hypertension Maternal Grandmother   . Arthritis Maternal Grandfather   . Diabetes Maternal Grandfather   . Hypertension Maternal Grandfather   . Kidney disease Maternal Grandfather   . Stroke Maternal Grandfather   . Diabetes Paternal Grandmother   . Birth defects Cousin     CLEFT LIP  . Cancer Cousin   . Hypertension Maternal Aunt     Social History: History  Substance Use Topics  . Smoking status: Current Every Day Smoker -- 0.25 packs/day    Types: Cigarettes  . Smokeless tobacco: Never Used  . Alcohol Use: Yes     Comment: OCC.  NONE OSINCE PREGNANT    Allergies: No Known Allergies  Meds:  Prescriptions prior to admission  Medication Sig Dispense Refill  . acetaminophen (TYLENOL) 325 MG tablet Take 650 mg by mouth every 6 (six) hours as needed.      . metroNIDAZOLE (FLAGYL) 500 MG tablet Take two tablets by mouth twice a day, for one day.  Or you can take all four tablets at once if you can tolerate it.  4 tablet  0    ROS: Pertinent positives findings in history of present illness. Negative for headache, vision changes, epigastric pain, vaginal bleeding, vaginal discharge, leaking fluid.  Physical Exam  Blood pressure 107/51, pulse 58, temperature 97.9 F (36.6  C), temperature source Oral, resp. rate 20, height 5' 0.24" (1.53 m), weight 50.803 kg (112 lb). GENERAL: Well-developed, well-nourished female in severe distress.  HEENT: normocephalic HEART: normal rate, and rhythm no murmurs rubs or gallops. RESP: normal effort, clear to auscultation bilaterally. ABDOMEN: Soft, non-tender, gravid appropriate for gestational age EXTREMITIES: Nontender, no edema NEURO: alert and oriented SPECULUM EXAM: Deferred Dilation: 106 Effacement (%): 90 Station: -2 Presentation: Vertex (per bedside ultrasound) Exam by:: Ivonne AndrewV Smith CNM  FHT:  Baseline 130 , moderate variability, no accelerations present, no decelerations. Reassuring for gestational age. Contractions: q 3 mins,  strong   Labs: Results for orders placed during the hospital encounter of 04/19/14 (from the past 24 hour(s))  CBC     Status: Abnormal   Collection Time    04/19/14  4:26 AM      Result Value Ref Range   WBC 10.7 (*) 4.0 - 10.5 K/uL   RBC 4.18  3.87 - 5.11 MIL/uL   Hemoglobin 11.6 (*) 12.0 - 15.0 g/dL   HCT 98.132.2 (*) 19.136.0 - 47.846.0 %   MCV 77.0 (*) 78.0 - 100.0 fL   MCH 27.8  26.0 - 34.0 pg   MCHC 36.0  30.0 - 36.0 g/dL   RDW 29.513.6  62.111.5 - 30.815.5 %   Platelets 235  150 - 400 K/uL    Imaging:   MAU Course: Betamethasone given.   Assessment: 1. Preterm labor    Plan: Admit to labor and delivery per consult with Dr. Macon LargeAnyanwu. NICU team notified of preterm admission. Next sulfate Ampicillin for unknown GBS and advanced labor. GBS PCR. UDS.  GliddenVirginia Smith, CNM 04/19/2014 5:42 AM   Attestation of Attending Supervision of Advanced Practitioner (PA/CNM/NP): Evaluation and management procedures were performed by the Advanced Practitioner under my supervision and collaboration.  I have reviewed the Advanced Practitioner's note and chart, and I agree with the management and plan.  Jaynie CollinsUGONNA  Yarelie Hams, MD, FACOG Attending Obstetrician & Gynecologist Faculty Practice, [facility]

## 2014-04-19 NOTE — Progress Notes (Signed)
Julia Roman is feeling overwhelmed and shocked that things have happened so quickly and that her baby is here already.  She was tearful at times and is trying to stay strong for everyone in her family.  She has not yet named her daughter.  She is already feeling emotional about having to leave on Friday and I let her know that we can help her think through the logistics for that day and how we can help meet her emotional needs on that day.  We will continue to follow up as we are able, but please also page as needs arise.  Centex CorporationChaplain Katy Johndaniel Catlin Pager, [phone] 3:55 PM   04/19/14 1500  Clinical Encounter Type  Visited With Patient  Visit Type Spiritual support  Referral From Nurse  Spiritual Encounters  Spiritual Needs Emotional  Stress Factors  Patient Stress Factors Loss of control

## 2014-04-19 NOTE — Progress Notes (Signed)
UR completed 

## 2014-04-19 NOTE — MAU Provider Note (Addendum)
Chief Complaint:  Contractions   First Provider Initiated Contact with Patient 04/19/2014 at 0420.  HPI: Julia Roman is a 18 y.o. G2P1001 at [redacted]w[redacted]d who presents to maternity admissions reporting contractions since 3 AM and rectal pressure. Denies leakage of fluid or vaginal bleeding. Good fetal movement.   [facility] No other prenatal care.  Patient Active Problem List   Diagnosis Date Noted  . Preterm labor 04/19/2014  . Fetal bilateral pyelectasis; antepartum 04/11/2014  . Marijuana use complicating pregnancy 04/05/2014  . Smoking complicating pregnancy 04/05/2014  . Trichomonal vaginitis in pregnancy in second trimester   . Chlamydia infection complicating pregnancy in second trimester 04/04/2014  . Current smoker 02/03/2013  . Marijuana use 02/03/2013  . History of depression 02/03/2013  . 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 Late prenatal care 01/27/2013   Limited prenatal care Close pregnancy spacing   Past Medical History: Past Medical History  Diagnosis Date  . Ovarian cyst 2013  . Depression AGE 12    NO MEDS CURRENTLY  . Urinary tract infection     OCC  . Chlamydia 2010  . Gonorrhea 2010  . Sickle cell trait   . Trichomonas vaginalis infection 4/282015    Past obstetric history: OB History  Gravida Para Term Preterm AB SAB TAB Ectopic Multiple Living  2 1 1       1    # Outcome Date GA Lbr Len/2nd Weight Sex Delivery Anes PTL Lv  2 CUR           1 TRM 08/11/13 [redacted]w[redacted]d 03:23 / 00:26 2.26 kg (4 lb 15.7 oz) M SVD EPI  Y      Past Surgical History: Past Surgical History  Procedure Laterality Date  . No past surgeries       Family History: Family History  Problem Relation Age of Onset  . Asthma Mother   . Fibromyalgia Mother   . Asthma Brother   . Cancer Maternal Aunt     MOUTH  . Hypertension Maternal Aunt   . Heart disease Maternal Grandmother   . Hyperlipidemia Maternal Grandmother   .  Hypertension Maternal Grandmother   . Arthritis Maternal Grandfather   . Diabetes Maternal Grandfather   . Hypertension Maternal Grandfather   . Kidney disease Maternal Grandfather   . Stroke Maternal Grandfather   . Diabetes Paternal Grandmother   . Birth defects Cousin     CLEFT LIP  . Cancer Cousin   . Hypertension Maternal Aunt     Social History: History  Substance Use Topics  . Smoking status: Current Every Day Smoker -- 0.25 packs/day    Types: Cigarettes  . Smokeless tobacco: Never Used  . Alcohol Use: Yes     Comment: OCC.  NONE OSINCE PREGNANT    Allergies: No Known Allergies  Meds:  Prescriptions prior to admission  Medication Sig Dispense Refill  . acetaminophen (TYLENOL) 325 MG tablet Take 650 mg by mouth every 6 (six) hours as needed.      . metroNIDAZOLE (FLAGYL) 500 MG tablet Take two tablets by mouth twice a day, for one day.  Or you can take all four tablets at once if you can tolerate it.  4 tablet  0    ROS: Pertinent positives findings in history of present illness. Negative for headache, vision changes, epigastric pain, vaginal bleeding, vaginal discharge, leaking fluid.  Physical Exam  Blood pressure 107/51, pulse 58, temperature 97.9 F (36.6   C), temperature source Oral, resp. rate 20, height 5' 0.24" (1.53 m), weight 50.803 kg (112 lb). GENERAL: Well-developed, well-nourished female in severe distress.  HEENT: normocephalic HEART: normal rate, and rhythm no murmurs rubs or gallops. RESP: normal effort, clear to auscultation bilaterally. ABDOMEN: Soft, non-tender, gravid appropriate for gestational age EXTREMITIES: Nontender, no edema NEURO: alert and oriented SPECULUM EXAM: Deferred Dilation: 106 Effacement (%): 90 Station: -2 Presentation: Vertex (per bedside ultrasound) Exam by:: V Smith CNM  FHT:  Baseline 130 , moderate variability, no accelerations present, no decelerations. Reassuring for gestational age. Contractions: q 3 mins,  strong   Labs: Results for orders placed during the hospital encounter of 04/19/14 (from the past 24 hour(s))  CBC     Status: Abnormal   Collection Time    04/19/14  4:26 AM      Result Value Ref Range   WBC 10.7 (*) 4.0 - 10.5 K/uL   RBC 4.18  3.87 - 5.11 MIL/uL   Hemoglobin 11.6 (*) 12.0 - 15.0 g/dL   HCT 32.2 (*) 36.0 - 46.0 %   MCV 77.0 (*) 78.0 - 100.0 fL   MCH 27.8  26.0 - 34.0 pg   MCHC 36.0  30.0 - 36.0 g/dL   RDW 13.6  11.5 - 15.5 %   Platelets 235  150 - 400 K/uL    Imaging:   MAU Course: Betamethasone given.   Assessment: 1. Preterm labor    Plan: Admit to labor and delivery per consult with Dr. Anyanwu. NICU team notified of preterm admission. Next sulfate Ampicillin for unknown GBS and advanced labor. GBS PCR. UDS.  Virginia Smith, CNM 04/19/2014 5:42 AM   Attestation of Attending Supervision of Advanced Practitioner (PA/CNM/NP): Evaluation and management procedures were performed by the Advanced Practitioner under my supervision and collaboration.  I have reviewed the Advanced Practitioner's note and chart, and I agree with the management and plan.  UGONNA  ANYANWU, MD, FACOG Attending Obstetrician & Gynecologist Faculty Practice, [facility]   

## 2014-04-19 NOTE — MAU Note (Signed)
Pt c/o uc that started around 3am. Denies LOF or vag bleeding. No hx of PTL. +FM

## 2014-04-19 NOTE — Lactation Note (Signed)
This note was copied from the chart of Girl Victoriano LainNautica Solan. Lactation Consultation Note       Initial consult with this mom of a NICU baby, now 7 hours old, and 25 2/7 weeks corrected gestation.  Mom has an 48 month old at home, but did not breast feed. She is eager to provide EBm for this baby. She pumped and hand expressed 15 mls in first pumping. Mom very receptive to pumping, Skin to skin encouraged, as soon as possible for baby. [facility] but missed it due to early delivery. Mom to call [facility] to set up an appointment, for application and DEP.   Patient Name: Girl Victoriano Lainautica Desir Today's Date: 04/19/2014 Reason for consult: Initial assessment;NICU baby   Maternal Data Formula Feeding for Exclusion: Yes (NICU baby) Infant to breast within first hour of birth: No Breastfeeding delayed due to:: Infant status Has patient been taught Hand Expression?: Yes Does the patient have breastfeeding experience prior to this delivery?: Yes  Feeding    LATCH Score/Interventions                      Lactation Tools Discussed/Used Tools: Pump Breast pump type: Double-Electric Breast Pump WIC Program: Yes (mom to call to see if she is active, or needs to make appointment to apply, since she missed her appointment when baby cam early) Pump Review: Setup, frequency, and cleaning;Milk Storage;Other (comment) (premie setting, hand expression nd review of NICU booklet ) Initiated by:: bedside rn Date initiated:: 04/19/14   Consult Status Consult Status: Follow-up Date: 04/20/14 Follow-up type: In-patient    Alfred LevinsChristine Anne Vondra Aldredge 04/19/2014, 1:34 PM

## 2014-04-20 MED ORDER — TETANUS-DIPHTH-ACELL PERTUSSIS 5-2.5-18.5 LF-MCG/0.5 IM SUSP: 0.5000 mL | Freq: Once | INTRAMUSCULAR | Status: DC

## 2014-04-20 MED ORDER — TETANUS-DIPHTH-ACELL PERTUSSIS 5-2.5-18.5 LF-MCG/0.5 IM SUSP
0.5000 mL | Freq: Once | INTRAMUSCULAR | Status: AC
  Administered 2014-04-20: 0.5 mL via INTRAMUSCULAR
  Filled 2014-04-20: qty 0.5

## 2014-04-20 NOTE — Lactation Note (Signed)
This note was copied from the chart of Julia Victoriano LainNautica Soliday. Lactation Consultation Note Mom states she is pumping "some". Enc mom to pump 8 times a day, once at night, with hand expression.  Mom states she is no longer active with WIC. Inst mom to call WIC to get enrolled for her pump. Mom states will call. Mom has no other questions or concerns at this time.  Patient Name: Julia Roman VHQIO'NToday's Date: 04/20/2014     Maternal Data    Feeding    LATCH Score/Interventions                      Lactation Tools Discussed/Used     Consult Status      Julia Roman 04/20/2014, 11:54 AM

## 2014-04-20 NOTE — Progress Notes (Signed)
Post Partum Day 1 Subjective: no complaints, up ad lib, voiding and tolerating PO  Objective: Blood pressure 112/68, pulse 71, temperature 97.6 F (36.4 C), temperature source Oral, resp. rate 18, height 5' 0.24" (1.53 m), weight 112 lb (50.803 kg), SpO2 99.00%, unknown if currently breastfeeding.  Physical Exam:  General: alert, cooperative and no distress Lochia: appropriate Uterine Fundus: firm Incision:  DVT Evaluation: No evidence of DVT seen on physical exam.   Recent Labs  04/19/14 0426  HGB 11.6*  HCT 32.2*    Assessment/Plan: Breastfeeding Home tomorrow Undecided contrac eption   LOS: 1 day   Lazaro ArmsLuther H Eure 04/20/2014, 7:55 AM

## 2014-04-21 ENCOUNTER — Ambulatory Visit: Payer: Self-pay

## 2014-04-21 DIAGNOSIS — Z8751 Personal history of pre-term labor: Secondary | ICD-10-CM

## 2014-04-21 DIAGNOSIS — O09219 Supervision of pregnancy with history of pre-term labor, unspecified trimester: Secondary | ICD-10-CM

## 2014-04-21 MED ORDER — DOCUSATE SODIUM 100 MG PO CAPS: 100.0000 mg | ORAL_CAPSULE | Freq: Two times a day (BID) | ORAL | Status: DC

## 2014-04-21 MED ORDER — IBUPROFEN 600 MG PO TABS: 600.0000 mg | ORAL_TABLET | Freq: Four times a day (QID) | ORAL | Status: DC

## 2014-04-21 NOTE — Progress Notes (Signed)
CSW met with parents to complete assessment due to admission of baby at 58 weeks.  Full assessment to follow.

## 2014-04-21 NOTE — Progress Notes (Signed)
Pt discharged to home with significant other and another young man.  Condition stable.  Pt verbalizes understanding [facility] for desired Depo Provera injection for birth control.  [facility]  No equipment for home ordered at discharge.

## 2014-04-21 NOTE — Lactation Note (Signed)
This note was copied from the chart of Julia Victoriano LainNautica Heberle. Lactation Consultation Note    Follow up consult with mom of a NICU baby, now 6760 hours old, and 25 4/7 week corrected gestation. Mom is discharged to home today, and was not able to get a WIC DEP, and I loaned her a [employer] DEP. Mom has a good milk supply, expressing up to 20=-25 mls at this time. i will follow this family in the nICU.  Patient Name: Julia Victoriano Lainautica Baugher GEXBM'WToday's Date: 04/21/2014 Reason for consult: Follow-up assessment;NICU baby   Maternal Data    Feeding    LATCH Score/Interventions                      Lactation Tools Discussed/Used WIC Program: Yes   Consult Status Consult Status: PRN Follow-up type: In-patient    Julia Roman 04/21/2014, 5:44 PM

## 2014-04-21 NOTE — Discharge Instructions (Signed)
Preterm Birth Preterm birth is a birth that happens before 37 weeks of pregnancy. Most pregnancies last about 39 41 weeks. Every week in the womb is important and is beneficial to the health of the infant. Infants born before 37 weeks of pregnancy are at a higher risk for complications. Depending on when the infant was born, he or she may be:  Late preterm. Born between 32 weeks and 37 weeks of pregnancy.  Very preterm. Born at less than 32 weeks of pregnancy.  Extremely preterm. Born at less than 25 weeks of pregnancy. The earlier a baby is born, the more likely the child will have issues related to prematurity. Complications and problems that can be seen in infants born too early include:  Problems breathing (respiratory distress syndrome).  Low birth weight.  Problems feeding.  Sleeping problems.  Yellowing of the skin (jaundice).  Infections such as pneumonia. Babies born very preterm or extremely preterm are at risk for more serious medical issues. These include:  More severe breathing issues.  Eyesight issues.  Brain development issues (intraventricular hemorrhage).  Behavioral and emotional development issues.  Growth and developmental delays.  Cerebral palsy.  Serious feeding or bowel complications (necrotizing enterocolitis). CAUSES  There are two broad categories of preterm birth.  Spontaneous preterm birth. This is a birth resulting from preterm labor (not medically induced) or preterm premature rupture of membranes (PPROM).  Indicated preterm birth. This is a birth resulting from labor being medically induced due to health, personal, or social reasons. RISK FACTORS Preterm birth may be related to certain medical conditions, lifestyle factors, or demographic factors encountered by the mother or fetus.  Medical conditions include:  Multiple gestations (twins, triplets, and so on).  Infection.  Diabetes.  Heart disease.  Kidney disease.  Cervical or  uterine abnormalities.  Being underweight.  High blood pressure or preeclampsia.  Premature rupture of membranes (PROM).  Birth defects in the fetus.  Lifestyle factors include:  Poor prenatal care.  Poor nutrition or anemia.  Cigarette smoking.  Consuming alcohol.  High levels of stress and lack of social or emotional support.  Exposure to chemical or environmental toxins.  Substance abuse.  Demographic factors include:  African-American ethnicity.  Age (younger than 3718 or older than 19 years of age).  Low socioeconomic status. Women with a history of preterm labor or who become pregnant within 4618 months of giving birth are also at increased risk for preterm birth. DIAGNOSIS  Your health care provider may request additional tests to diagnose underlying complications resulting from preterm birth. Tests on the infant may include:  Physical exam.  Blood tests.  Chest X-rays.  Heart-lung monitoring. TREATMENT  After birth, special care will be taken to assess any problems or complications for the infant. Supportive care will be provided for the infant. Treatment depends on what problems are present and any complications that develop. Some preterm infants are cared for in a neonatal intensive care unit. In general, care may include:  Maintaining temperature and oxygen in a clear heated box (baby isolette).  Monitoring the infant's heart rate, breathing, and level of oxygen in the blood.  Monitoring for signs of infection and, if needed, giving IV antibiotic medicine.  Inserting a feeding tube (nose, mouth) or giving IV nutrition if unable to feed.  Inserting a breathing tube (ventilation).  Respiration support (continuous positive airway pressure [CPAP] or oxygen). Treatment will change as the infant builds up strength and is able to breathe and eat on his or  her own. For some infants, no special treatment is necessary. Parents may be educated on the potential  health risks of prematurity to the infant. HOME CARE INSTRUCTIONS  Understand your infant's special conditions and needs. It may be reassuring to learn about infant CPR.  Monitor your infant in the car seat until he or she grows and matures. Infant car seats can cause breathing difficulties for preterm infants.  Keep your infant warm. Dress your infant in layers and keep him or her away from drafts, especially in cold months of the year.  Wash your hands thoroughly after going to the bathroom or changing a diaper. Late preterm infants may be more prone to infection.  Follow all your health care provider's instructions for providing support and care to your preterm infant.  Get support from organizations and groups that understand your challenges.  Follow up with your infant's health care provider as directed. Prevention There are some things you can do to help lower your risk of having a preterm infant in the future. These include:  Good prenatal care throughout the entire pregnancy. See a health care provider regularly for advice and tests.  Management of underlying medical conditions.  Proper self-care and lifestyle changes.  Proper diet and weight control.  Watching for signs of various infections. SEEK MEDICAL CARE IF:  Your infant has feeding difficulties.  Your infant has sleeping difficulties.  Your infant has breathing difficulties.  Your infant's skin starts to look yellow.  Your infant shows signs of infection, such as a stuffy nose, fever, crying, or bluish color of the skin. FOR MORE INFORMATION March of Dimes: www.marchofdimes.com Prematurity.org: www.prematurity.org Document Released: 02/14/2004 Document Revised: 09/14/2013 Document Reviewed: 06/23/2013 [facility] Patient Information 2014 [place], [place].   Postpartum Care After Vaginal Delivery After you deliver your newborn (postpartum period), the usual stay in the hospital is 24 72 hours. If there were  problems with your labor or delivery, or if you have other medical problems, you might be in the hospital longer.  While you are in the hospital, you will receive help and instructions on how to care for yourself and your newborn during the postpartum period.  While you are in the hospital:  Be sure to tell your nurses if you have pain or discomfort, as well as where you feel the pain and what makes the pain worse.  If you had an incision made near your vagina (episiotomy) or if you had some tearing during delivery, the nurses may put ice packs on your episiotomy or tear. The ice packs may help to reduce the pain and swelling.  If you are breastfeeding, you may feel uncomfortable contractions of your uterus for a couple of weeks. This is normal. The contractions help your uterus get back to normal size.  It is normal to have some bleeding after delivery.  For the first 1 3 days after delivery, the flow is red and the amount may be similar to a period.  It is common for the flow to start and stop.  In the first few days, you may pass some small clots. Let your nurses know if you begin to pass large clots or your flow increases.  Do not  flush blood clots down the toilet before having the nurse look at them.  During the next 3 10 days after delivery, your flow should become more watery and pink or brown-tinged in color.  Ten to fourteen days after delivery, your flow should be a small amount of  yellowish-white discharge.  The amount of your flow will decrease over the first few weeks after delivery. Your flow may stop in 6 8 weeks. Most women have had their flow stop by 12 weeks after delivery.  You should change your sanitary pads frequently.  Wash your hands thoroughly with soap and water for at least 20 seconds after changing pads, using the toilet, or before holding or feeding your newborn.  You should feel like you need to empty your bladder within the first 6 8 hours after  delivery.  In case you become weak, lightheaded, or faint, call your nurse before you get out of bed for the first time and before you take a shower for the first time.  Within the first few days after delivery, your breasts may begin to feel tender and full. This is called engorgement. Breast tenderness usually goes away within 48 72 hours after engorgement occurs. You may also notice milk leaking from your breasts. If you are not breastfeeding, do not stimulate your breasts. Breast stimulation can make your breasts produce more milk.  Spending as much time as possible with your newborn is very important. During this time, you and your newborn can feel close and get to know each other. Having your newborn stay in your room (rooming in) will help to strengthen the bond with your newborn. It will give you time to get to know your newborn and become comfortable caring for your newborn.  Your hormones change after delivery. Sometimes the hormone changes can temporarily cause you to feel sad or tearful. These feelings should not last more than a few days. If these feelings last longer than that, you should talk to your caregiver.  If desired, talk to your caregiver about methods of family planning or contraception.  Talk to your caregiver about immunizations. Your caregiver may want you to have the following immunizations before leaving the hospital:  Tetanus, diphtheria, and pertussis (Tdap) or tetanus and diphtheria (Td) immunization. It is very important that you and your family (including grandparents) or others caring for your newborn are up-to-date with the Tdap or Td immunizations. The Tdap or Td immunization can help protect your newborn from getting ill.  Rubella immunization.  Varicella (chickenpox) immunization.  Influenza immunization. You should receive this annual immunization if you did not receive the immunization during your pregnancy. Document Released: 09/21/2007 Document  Revised: 08/18/2012 Document Reviewed: 07/21/2012 [facility] Patient Information 2014 [place], [place].

## 2014-04-21 NOTE — Discharge Summary (Signed)
Obstetric Discharge Summary Reason for Admission: onset of labor, pre-term at 25.2 weeks Prenatal Procedures: ultrasound, limited PNC x 1 initial visit Intrapartum Procedures: spontaneous vaginal delivery, pre-term Postpartum Procedures: none Complications-Operative and Postpartum: none Hemoglobin  Date Value Ref Range Status  04/19/2014 11.6* 12.0 - 15.0 g/dL Final     HCT  Date Value Ref Range Status  04/19/2014 32.2* 36.0 - 46.0 % Final    Discharge Diagnoses: Pre-term (25.2 weeks) delivered via SVD d/t precipitous pre-term labor  Hospital Course:  Julia Fontanaautica U Glover is a [age] y.o. [phone] (with limited PNC only x 1 initial visit), who presented to [facility] initially for pre-term contractions with advanced labor with rapid cervical dilation, admitted promptly to L&D with anticipated delivery, NICU notified for delivery. Initially GBS unknown (PCR collected, later found GBS-positive), started on Ampicillin prophylaxis (did not receive complete dose d/t delivery). She progressed quickly to have an uncomplicated SVD within 1 hour of admission, delivered viable baby girl, admitted to NICU d/t pre-term @ 25.2 weeks.  Postpartum course uncomplicated. Patient received emotional support and chaplain services due to significantly unexpected delivery at 25 weeks, frequently visited NICU for updates. On day of discharge, she was able to ambulate, tolerate PO, pain controlled on PO Motrin, +flatus (without BM), reduced vaginal bleeding, void normally. Plans to continue breast pumping to supply baby in NICU with breast milk bottles. For contraception, plans for Depo-provera (undecided regarding LARCs). She was discharged home with instructions for postpartum care.    Delivery Note  Patient arrived to L&D and rapidly progressed to full cervical dilatation. She started to push. Neonatology team was called and were present.  At 5:26 AM a viable female was delivered via Vaginal, Spontaneous Delivery (Presentation:  Left Occiput Anterior). APGAR: 7, 7; weight 2 lb 0.8 oz (930 g).  Placenta status: intact, 3VC. Cord: 3 vessels with the following complications: None.  Anesthesia: None  Lacerations: None  Est. Blood Loss (mL): 300 ml  Mom to postpartum. Baby to NICU.   Tereso NewcomerUgonna A Anyanwu, MD  04/19/2014, 6:00 AM  Physical Exam:  General: alert and cooperative, well-appearing, good spirits, NAD Lochia: appropriate Uterine Fundus: firm DVT Evaluation: No evidence of DVT seen on physical exam. Negative Homan's sign. No cords or calf tenderness. No significant calf/ankle edema.  Discharge Information: Date: 04/21/2014 Activity: pelvic rest Diet: routine Medications: PNV, Ibuprofen and Colace Baby feeding: plans to breastfeed (currently pumping breastmilk, bottle feeding) - NICU Contraception: Depo-Provera Condition: stable Instructions: refer to practice specific booklet Discharge to: home   Newborn Data: Live born female  Birth Weight: 2 lb 0.8 oz (930 g) APGAR: 7, 7  Currently admitted to NICU for significant pre-term GA (25.2 weeks), anticipate to remain in NICU x 2 to 3 months prior to discharge to home with parents.  Saralyn PilarAlexander Karamalegos, DO [facility], PGY-1  04/21/2014, 10:00 AM  I have seen and examined this patient and agree with above documentation in the resident's note.   Rulon AbideKeli Imaad Reuss, M.D. [facility] 04/21/2014 11:21 AM

## 2014-04-24 NOTE — Progress Notes (Signed)
Clinical Social Work Department PSYCHOSOCIAL ASSESSMENT - MATERNAL/CHILD 04/21/2014-Late Entry  Patient:  Julia Roman, Julia Roman  Account Number:  [id number]  Admit Date:  04/19/2014  Ardine Eng Name:   Dessa Phi    Clinical Social Worker:  Terri Piedra, LCSW   Date/Time:  04/21/2014 11:30 PM  Date Referred:        Other referral source:   No referral-NICU admission-25 weeks    I:  FAMILY / HOME ENVIRONMENT Child's legal guardian:  PARENT  Guardian - Name Guardian - Age Guardian - Address  Julia Roman [address] [place], [place] [address]  [name] [address] [place], [place]   Other household support members/support persons Name Relationship DOB  Birder Robson [facility] 08/2013   MOTHER    SISTER 16   BROTHER 12   Other support:   Parents state they have a good support system.    II  PSYCHOSOCIAL DATA Information Source:  Family Interview  Museum/gallery curator and [employer] Employment:   FOB works at [employer] states she is looking for a job   Museum/gallery curator resources:  [employer] [place] Other  [place] / Grade:   Maternity Care Coordinator / Child Services Coordination / Early Interventions:   Baby will qualify for [place], [place] and Early Intervention Services.  Cultural issues impacting care:   None stated    III  STRENGTHS Strengths  Adequate Resources  Compliance with medical plan  Other - See comment  Supportive family/friends  Understanding of illness   Strength comment:  Pediatric follow up will be with [facility]   IV  RISK FACTORS AND CURRENT PROBLEMS Current Problem:  None   Risk Factor & Current Problem Patient Issue Family Issue Risk Factor / Current Problem Comment   N N     V  SOCIAL WORK ASSESSMENT  CSW met with parents in MOB's third floor room/315 to introduce myself, offer support and complete assessment due to admission of 25 week baby to NICU.  Parents were pleasant and welcoming of  CSW's visit.  FOB was packing up in preparation for MOB's discharge and MOB was pumping.  They stated this was a good time to talk with them.  CSW explained support services offered by NICU CSW while their daughter in is the NICU.  CSW asked parents to tell their birth story and talk about how they feel they are coping at this time.  MOB shared that her pregnancy had been going well and that in the middle of the night she felt pain that she thought was [employer] contractions.  She states when they did not go away, they decided to go to the hospital.  She states they were glad they did because she was dilated to a 6 when she arrived.  She states she started questioning if she had done something wrong or if there was anything she could have done differently to avoid delivering so early.  CSW explained that sometimes babies just come for no apparent reason and encouraged her not to feel guilty or blame herself.  CSW talked about common emotions related to the NICU admission and MOB said that she is trying not to be emotional.  CSW cautioned her not to ignore her emotions.  CSW helped her acknowledge that this is an emotional/unplanned situation and encouraged her and FOB to allow themselves to feel and process their emotions.  CSW explained that emotions will come  out sooner or later and pressing them down/not addressing them is often not advisable.  CSW explained that CSW is here to speak to if they need assistance processing their feelings/need someone to talk to.  CSW also talked about PPD signs and symptoms to watch for and encouraged MOB to talk with CSW if symptoms arise.  They seemed very appreciative and agreed with CSW.  CSW discussed, in general terms, the milestones a premature baby must meet in order to be ready for discharge and explained that this can take months to accomplish.  CSW encouraged parents to focus on spending time with their daughter and not have any expectations on when she will be ready  for discharge.  They both understand that her progress cannot be rushed and that her hospitalization is necessary for her survival.  Parents report that they have a good support system.  MOB states her 6 month old does not use his crib, therefore, they have one for baby when she comes home.  CSW stressed the importance of baby sleeping in the crib and not in bed with adults due to SIDS risks.  MOB was understanding.  CSW asked about transportation to visit baby once MOB is discharged.  She states her mother and FOB's mother will bring them, but they both work.  CSW offered bus passes if parents would use them.  MOB was very appreciative and accepted a 31 day pass.  CSW provided one for FOB as well.  CSW asked if MOB and FOB lived together and she said no.  She states they are not currently in a relationship.  She informed CSW that she wants to be together, but FOB is not sure.  They were living together, but she has moved back in with her mother now.  FOB appeared very supportive of MOB during the assessment.  He was not present for the entire conversation, as he wanted to visit with baby and take MOB's milk to NICU.  CSW provided MOB with contact information and asked her to call anytime.  CSW also informed MOB that she may call CSW if parents would like to schedule a family conference at any time.  CSW did not discuss baby's eligibility for SSI at this time, but will let them know of her eligibility and how to apply.   VI SOCIAL WORK PLAN Social Work Plan  Psychosocial Support/Ongoing Assessment of Needs  Patient/Family Education   Type of pt/family education:   Ongoing support services offered by NICU CSW  PPD signs and symptoms   If child protective services report - county:   If child protective services report - date:   Information/referral to community resources comment:   No referral needs noted at this time.   Other social work plan:

## 2014-05-02 ENCOUNTER — Encounter: Payer: Medicaid Other | Admitting: Obstetrics & Gynecology

## 2014-05-05 ENCOUNTER — Ambulatory Visit (HOSPITAL_COMMUNITY): Payer: Medicaid Other

## 2014-05-26 ENCOUNTER — Ambulatory Visit: Payer: Medicaid Other | Admitting: Family

## 2014-09-21 IMAGING — US US OB COMP +14 WK
1 series · 16 of 28 positions shown · non-contrast
Comparison: none

[Series 1: us ob comp +14 wk · 16 of 103 slices shown]
[im 1/103]
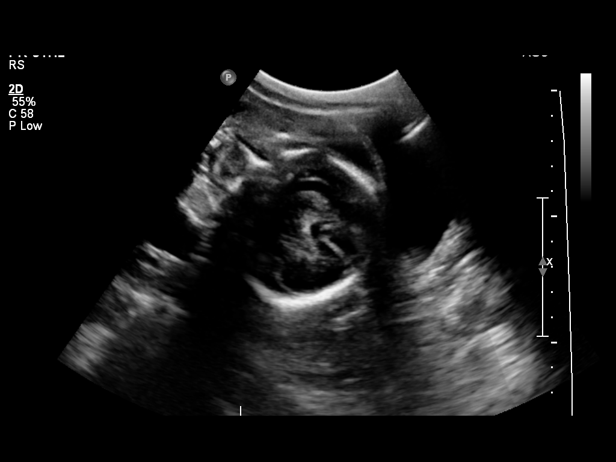
[im 8/103]
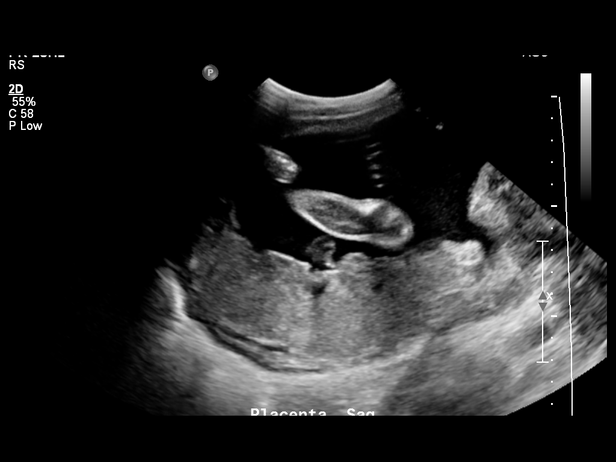
[im 16/103]
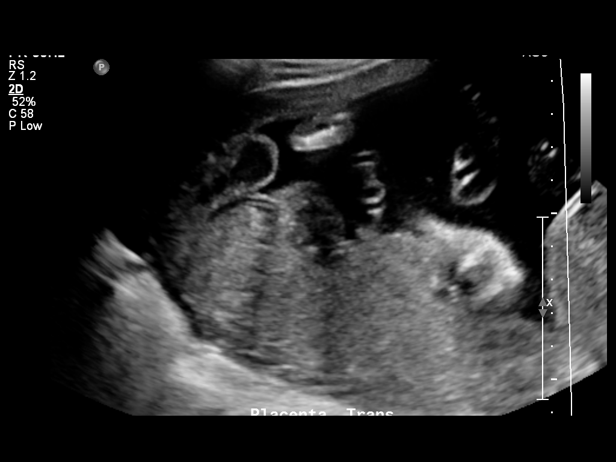
[im 23/103]
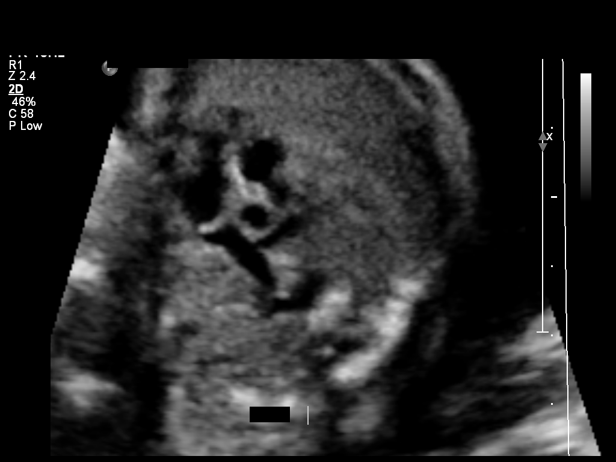
[im 27/103]
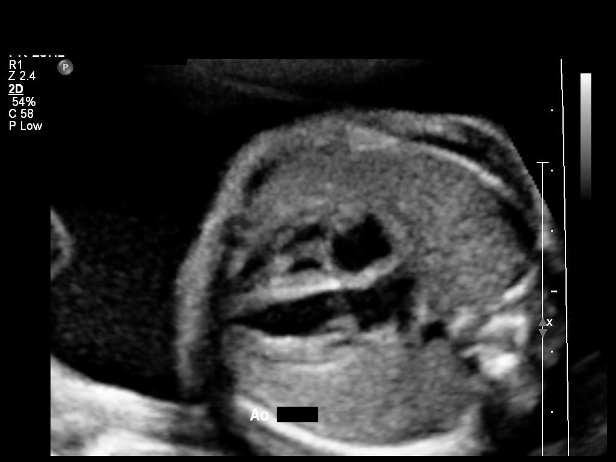
[im 35/103]
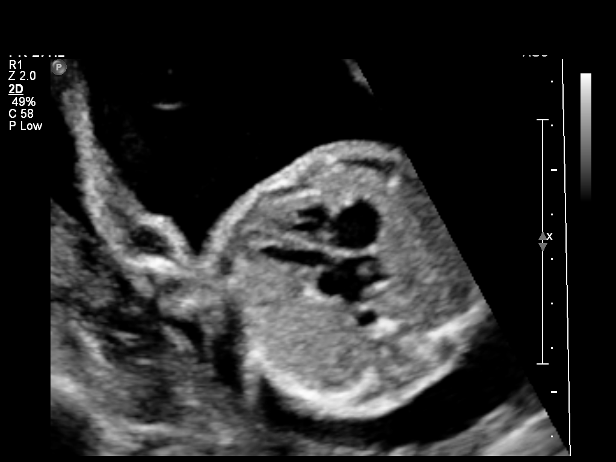
[im 42/103]
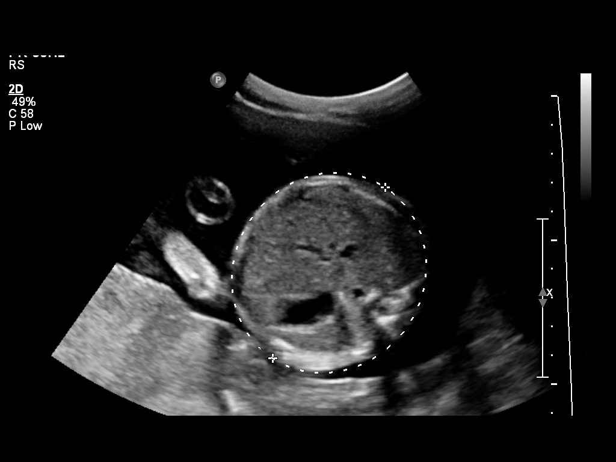
[im 50/103]
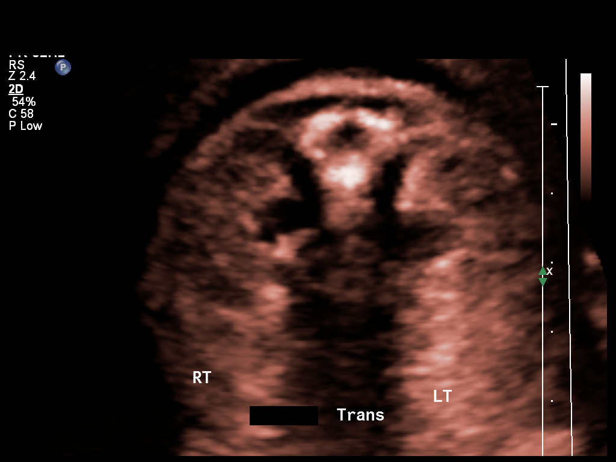
[im 53/103]
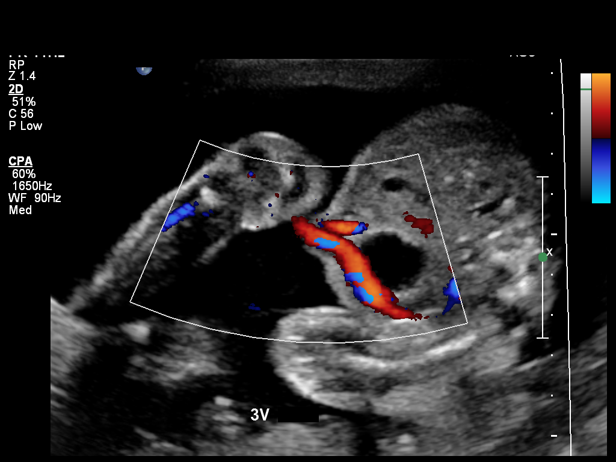
[im 61/103]
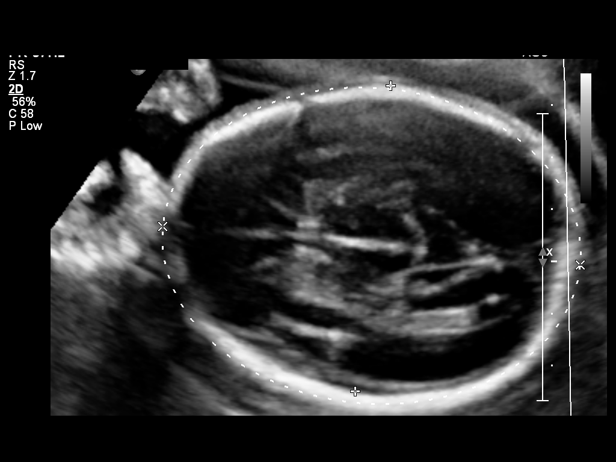
[im 69/103]
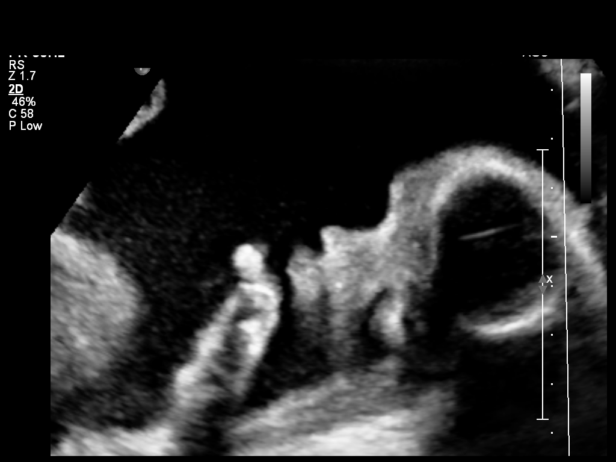
[im 76/103]
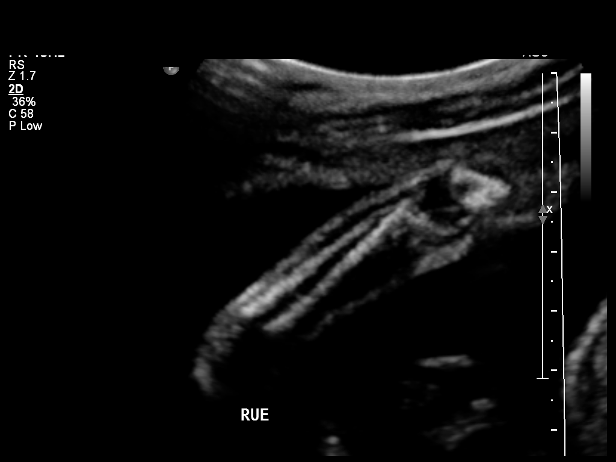
[im 80/103]
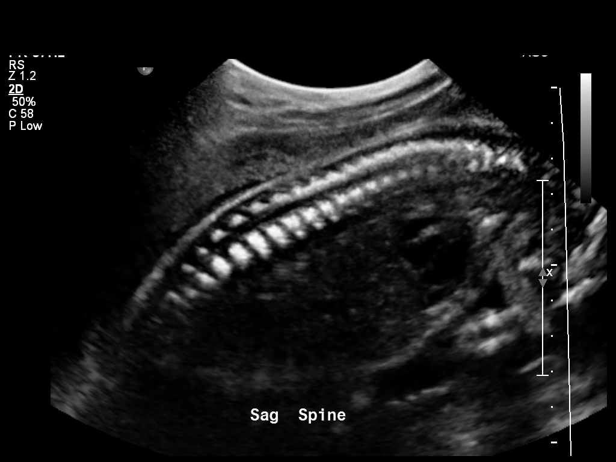
[im 87/103]
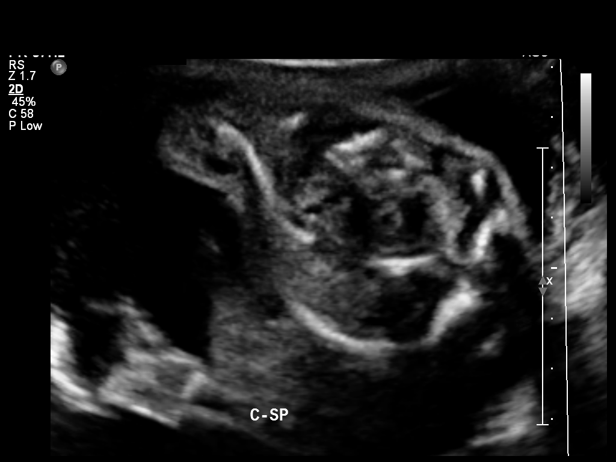
[im 95/103]
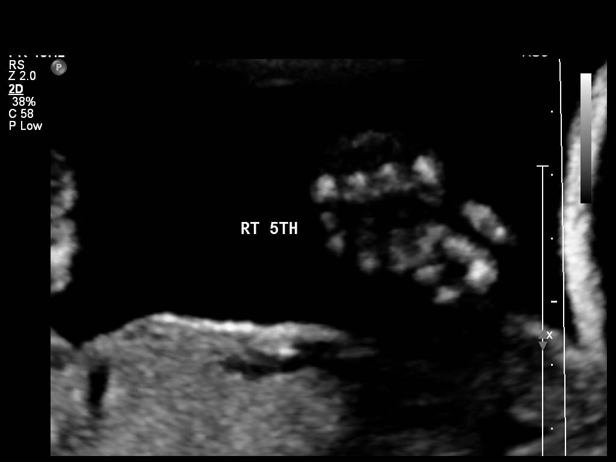
[im 103/103]
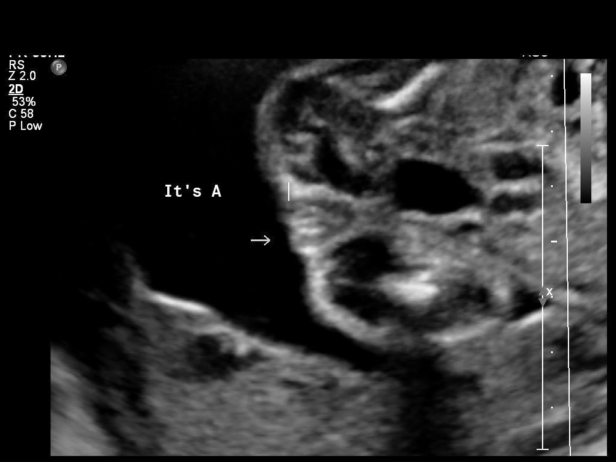

[16 of 28 positions shown; findings below may reference images not displayed]

OBSTETRICS REPORT
                      (Signed Final 04/07/2014 [DATE])

Service(s) Provided

 US OB COMP + 14 WK                                    76805.1
Indications

 Basic anatomic survey
 No or Little Prenatal Care
Fetal Evaluation

 Num Of Fetuses:    1
 Fetal Heart Rate:  136                          bpm
 Cardiac Activity:  Observed
 Presentation:      Cephalic
 Placenta:          Posterior, above cervical
                    os
 P. Cord            Visualized, central
 Insertion:

 Amniotic Fluid
 AFI FV:      Subjectively within normal limits
                                             Larg Pckt:     6.6  cm
Biometry

 BPD:       59  mm     G. Age:  24w 1d                CI:           71   70 - 86
                                                      FL/HC:      19.6   18.7 -

 HC:     223.1  mm     G. Age:  24w 2d       63  %    HC/AC:      1.05   1.05 -

 AC:     213.4  mm     G. Age:  25w 6d       95  %    FL/BPD:     74.2   71 - 87
 FL:      43.8  mm     G. Age:  24w 3d       65  %    FL/AC:      20.5   20 - 24
 HUM:     39.9  mm     G. Age:  24w 2d       59  %
 CER:     26.6  mm     G. Age:  24w 2d       63  %

 Est. FW:     768  gm    1 lb 11 oz      76  %
Gestational Age

 Clinical EDD:  23w 4d                                        EDD:   07/31/14
 U/S Today:     24w 5d                                        EDD:   07/23/14
 Best:          23w 4d     Det. By:  Clinical EDD             EDD:   07/31/14
Anatomy
 Cranium:          Appears normal         Aortic Arch:      Appears normal
 Fetal Cavum:      Appears normal         Ductal Arch:      Appears normal
 Ventricles:       Appears normal         Diaphragm:        Appears normal
 Choroid Plexus:   Appears normal         Stomach:          Appears normal, left
                                                            sided
 Cerebellum:       Appears normal         Abdomen:          Appears normal
 Posterior Fossa:  Appears normal         Abdominal Wall:   Appears nml (cord
                                                            insert, abd wall)
 Nuchal Fold:      Not applicable (>20    Cord Vessels:     Appears normal (3
                   wks GA)                                  vessel cord)
 Face:             Orbits appear          Kidneys:          Bilat
                   normal
                                                            pyelectasis,Rt
                                                            6.3mm,BtYmm
 Lips:             Appears normal         Bladder:          Appears normal
 Heart:            Appears normal         Spine:            Appears normal
                   (4CH, axis, and
                   situs)
 RVOT:             Appears normal         Lower             Appears normal
                                          Extremities:
 LVOT:             Appears normal         Upper             Appears normal
                                          Extremities:

 Other:  Fetus appears to be a female. Heels and 5th digit visualized.
Targeted Anatomy

 Fetal Central Nervous System
 Lat. Ventricles:  3.5                    Cisterna Magna:
Cervix Uterus Adnexa

 Cervical Length:    3        cm

 Cervix:       Normal appearance by transabdominal scan.
 Uterus:       No abnormality visualized.
 Left Ovary:    Size(cm) L: 2.4 x W: 2.02 x H: 1.23  Volume(cc):
 Right Ovary:   Not visualized.
 Adnexa:     No abnormality visualized. No adnexal mass visualized.
Impression

 SIUP at 45w4d (remote read)
 EFW 76th%
 Bilateral pyelectasis (apparently isolated)
 No previa
 Bilateral pyelectasis was identified with renal pelvis AP
 diameter measurement of 6.3mm for the right and 7mm for
 the left. Targeted examination of the remaining fetal anatomy
 was performed and no other dysmorphic features, or
 morphologic "soft markers" associated with aneuplidy, were
 detected. Isolated pyelectasis, or mild hydronephrosis, is
 observed in about 1 - 2 % of pregnancies at around 20 weeks
 of gestation. Most instances are of no pathological
 significance. When other abnormalities are present there is a
 significantly increased risk of attendant chromosomal defects,
 usually trisomy 21. However, in cases of isolated pyelectasis
 there remains some controversy about the true risk
 adjustment for trisomy 21, with investigators reporting 0 to
 1.5-fold increase over the background risk.
Recommendations

 Given that this is a remote read (precluding bedside
 discussion), I recommend follow up evaluation be scheduled
 at [facility] with MFM along with formal genetic counseling to
 discuss implications and follow up; this evaluation should be
 arranged in the next 3-4 weeks (please schedule).

 questions or concerns.

## 2014-10-09 ENCOUNTER — Encounter (HOSPITAL_COMMUNITY): Payer: Self-pay

## 2014-12-08 ENCOUNTER — Emergency Department (HOSPITAL_COMMUNITY)
Admission: EM | Admit: 2014-12-08 | Discharge: 2014-12-08 | Disposition: A | Discharge status: Home / Self Care | Payer: Medicaid Other | Attending: Emergency Medicine | Admitting: Emergency Medicine

## 2014-12-08 ENCOUNTER — Encounter (HOSPITAL_COMMUNITY): Payer: Self-pay | Admitting: Physical Medicine and Rehabilitation

## 2014-12-08 DIAGNOSIS — Y998 Other external cause status: Secondary | ICD-10-CM | POA: Insufficient documentation

## 2014-12-08 DIAGNOSIS — Y9289 Other specified places as the place of occurrence of the external cause: Secondary | ICD-10-CM | POA: Insufficient documentation

## 2014-12-08 DIAGNOSIS — M549 Dorsalgia, unspecified: Secondary | ICD-10-CM | POA: Diagnosis not present

## 2014-12-08 DIAGNOSIS — Z792 Long term (current) use of antibiotics: Secondary | ICD-10-CM | POA: Insufficient documentation

## 2014-12-08 DIAGNOSIS — Z720 Tobacco use: Secondary | ICD-10-CM | POA: Insufficient documentation

## 2014-12-08 DIAGNOSIS — R51 Headache: Secondary | ICD-10-CM | POA: Insufficient documentation

## 2014-12-08 DIAGNOSIS — Z8619 Personal history of other infectious and parasitic diseases: Secondary | ICD-10-CM | POA: Insufficient documentation

## 2014-12-08 DIAGNOSIS — Y9389 Activity, other specified: Secondary | ICD-10-CM | POA: Insufficient documentation

## 2014-12-08 DIAGNOSIS — X58XXXA Exposure to other specified factors, initial encounter: Secondary | ICD-10-CM | POA: Diagnosis not present

## 2014-12-08 DIAGNOSIS — R5383 Other fatigue: Secondary | ICD-10-CM | POA: Diagnosis not present

## 2014-12-08 DIAGNOSIS — Z8742 Personal history of other diseases of the female genital tract: Secondary | ICD-10-CM | POA: Diagnosis not present

## 2014-12-08 DIAGNOSIS — Z8659 Personal history of other mental and behavioral disorders: Secondary | ICD-10-CM | POA: Insufficient documentation

## 2014-12-08 DIAGNOSIS — Z79899 Other long term (current) drug therapy: Secondary | ICD-10-CM | POA: Insufficient documentation

## 2014-12-08 DIAGNOSIS — J029 Acute pharyngitis, unspecified: Secondary | ICD-10-CM | POA: Insufficient documentation

## 2014-12-08 DIAGNOSIS — R21 Rash and other nonspecific skin eruption: Secondary | ICD-10-CM | POA: Diagnosis present

## 2014-12-08 DIAGNOSIS — T7840XA Allergy, unspecified, initial encounter: Secondary | ICD-10-CM

## 2014-12-08 MED ORDER — DIPHENHYDRAMINE HCL 25 MG PO CAPS
25.0000 mg | ORAL_CAPSULE | Freq: Once | ORAL | Status: AC
  Administered 2014-12-08: 25 mg via ORAL
  Filled 2014-12-08: qty 1

## 2014-12-08 MED ORDER — DIPHENHYDRAMINE HCL 25 MG PO TABS: 25.0000 mg | ORAL_TABLET | Freq: Four times a day (QID) | ORAL | Status: DC | PRN

## 2014-12-08 NOTE — ED Notes (Signed)
Declined W/C at D/C and was escorted to lobby by RN. 

## 2014-12-08 NOTE — ED Notes (Signed)
Pt presents to department for evaluation of possible allergic reaction. Pt states she took Tramadol for sore throat last night, now states itching and rash all over body. NAD. Respirations unlabored.

## 2014-12-08 NOTE — ED Provider Notes (Signed)
CSN: [id number]     Arrival date & time 12/08/14  1205 History  This chart was scribed for non-physician practitioner, Emilia Beck, PA-C, working with Rolland Porter, MD by Charline Bills, ED Scribe. This patient was seen in room TR09C/TR09C and the patient's care was started at 12:22 PM.   Chief Complaint  Patient presents with  . Allergic Reaction   The history is provided by the patient. No language interpreter was used.   HPI Comments: Julia Roman is a 19 y.o. female ,with a h/o sickle cell trait, depression, who presents to the Emergency Department complaining of generalized, itchy rash onset 2 nights ago. Pt states that she developed sore throat, congestion, HA, fatigue, mild back pain onset 3-4 days ago and took previously prescribed Tramadol for the first time 2 days ago. She suspects that she is having an allergic reaction to the medication. No other symptoms or complaints at this time.   Past Medical History  Diagnosis Date  . Ovarian cyst 2013  . Depression AGE 57    NO MEDS CURRENTLY  . Urinary tract infection     OCC  . Chlamydia 2010  . Gonorrhea 2010  . Sickle cell trait   . Trichomonas vaginalis infection 4/282015   Past Surgical History  Procedure Laterality Date  . No past surgeries     Family History  Problem Relation Age of Onset  . Asthma Mother   . Fibromyalgia Mother   . Asthma Brother   . Cancer Maternal Aunt     MOUTH  . Hypertension Maternal Aunt   . Heart disease Maternal Grandmother   . Hyperlipidemia Maternal Grandmother   . Hypertension Maternal Grandmother   . Arthritis Maternal Grandfather   . Diabetes Maternal Grandfather   . Hypertension Maternal Grandfather   . Kidney disease Maternal Grandfather   . Stroke Maternal Grandfather   . Diabetes Paternal Grandmother   . Birth defects Cousin     CLEFT LIP  . Cancer Cousin   . Hypertension Maternal Aunt    History  Substance Use Topics  . Smoking status: Current Every Day Smoker --  0.25 packs/day    Types: Cigarettes  . Smokeless tobacco: Never Used  . Alcohol Use: Yes   OB History    Gravida Para Term Preterm AB TAB SAB Ectopic Multiple Living   Review of Systems  Constitutional: Positive for fatigue.  HENT: Positive for congestion and sore throat.   Musculoskeletal: Positive for back pain.  Skin: Positive for rash.  Neurological: Positive for headaches.  All other systems reviewed and are negative.  Allergies  Review of patient's allergies indicates no known allergies.  Home Medications   Prior to Admission medications   Medication Sig Start Date End Date Taking? Authorizing Provider  docusate sodium (COLACE) 100 MG capsule Take 1 capsule (100 mg total) by mouth 2 (two) times daily. 04/21/14   Saralyn Pilar, DO  ibuprofen (ADVIL,MOTRIN) 600 MG tablet Take 1 tablet (600 mg total) by mouth every 6 (six) hours. 04/21/14   Saralyn Pilar, DO  metroNIDAZOLE (FLAGYL) 500 MG tablet Take two tablets by mouth twice a day, for one day.  Or you can take all four tablets at once if you can tolerate it. 04/05/14   Tereso Newcomer, MD   Triage Vitals: BP 118/90 mmHg  Pulse 75  Temp(Src) 98.8 F (37.1 C) (Oral)  Resp 18  Ht 5'  1" (1.549 m)  Wt 105 lb (47.628 kg)  BMI 19.85 kg/m2  SpO2 99% Physical Exam  Constitutional: She is oriented to person, place, and time. She appears well-developed and well-nourished. No distress.  HENT:  Head: Normocephalic and atraumatic.  Mouth/Throat: Oropharynx is clear and moist.  Eyes: Conjunctivae and EOM are normal.  Neck: Neck supple.  Cardiovascular: Normal rate.   Pulmonary/Chest: Effort normal.  Musculoskeletal: Normal range of motion.  Neurological: She is alert and oriented to person, place, and time.  Skin: Skin is warm and dry.  No visible rash.   Psychiatric: She has a normal mood and affect. Her behavior is normal.  Nursing note and vitals reviewed.  ED Course  Procedures  (including critical care time) DIAGNOSTIC STUDIES: Oxygen Saturation is 99% on RA, normal by my interpretation.    COORDINATION OF CARE: 12:24 PM-Discussed treatment plan which includes Benadryl with pt at bedside and pt agreed to plan.   Labs Review Labs Reviewed - No data to display  Imaging Review No results found.   EKG Interpretation None      MDM   Final diagnoses:  Allergic reaction, initial encounter    12:27 PM I do not see a rash on the patient. Patient reports generalized itching. I will give her benadryl here and prescribed benadryl. Vitals stable and patient afebrile.   I personally performed the services described in this documentation, which was scribed in my presence. The recorded information has been reviewed and is accurate.    Emilia Beck, PA-C 12/08/14 1229  Rolland Porter, MD 12/15/14 2350

## 2014-12-08 NOTE — Discharge Instructions (Signed)
Take benadryl as needed for itching. Refer to attached documents for more information.

## 2015-01-29 ENCOUNTER — Encounter (HOSPITAL_COMMUNITY): Payer: Self-pay | Admitting: Emergency Medicine

## 2015-01-29 DIAGNOSIS — M549 Dorsalgia, unspecified: Secondary | ICD-10-CM | POA: Diagnosis present

## 2015-01-29 DIAGNOSIS — R1030 Lower abdominal pain, unspecified: Secondary | ICD-10-CM | POA: Insufficient documentation

## 2015-01-29 DIAGNOSIS — Z720 Tobacco use: Secondary | ICD-10-CM | POA: Diagnosis not present

## 2015-01-29 LAB — URINALYSIS, ROUTINE W REFLEX MICROSCOPIC
GLUCOSE, UA: NEGATIVE mg/dL
Hgb urine dipstick: NEGATIVE
KETONES UR: 40 mg/dL — AB
Leukocytes, UA: NEGATIVE
Nitrite: NEGATIVE
Protein, ur: NEGATIVE mg/dL
Specific Gravity, Urine: 1.033 — ABNORMAL HIGH (ref 1.005–1.030)
Urobilinogen, UA: 1 mg/dL (ref 0.0–1.0)
pH: 6 (ref 5.0–8.0)

## 2015-01-29 LAB — PREGNANCY, URINE: PREG TEST UR: NEGATIVE

## 2015-01-29 NOTE — ED Notes (Signed)
Pt presents with back pain for the past week worse when lifting something, denies injury.  Pt reports that today she began having lower abdominal pain this morning, denies N/V/D, denies urinary symptoms, denies vaginal discharge or bleeding.

## 2015-01-30 ENCOUNTER — Emergency Department (HOSPITAL_COMMUNITY)
Admission: EM | Admit: 2015-01-30 | Discharge: 2015-01-30 | Discharge status: Against Medical Advice | Payer: Medicaid Other | Attending: Emergency Medicine | Admitting: Emergency Medicine

## 2015-01-30 NOTE — ED Notes (Signed)
Called at triage 2x. No answer 

## 2015-02-15 ENCOUNTER — Encounter (HOSPITAL_COMMUNITY): Payer: Self-pay | Admitting: Emergency Medicine

## 2015-02-15 ENCOUNTER — Emergency Department (INDEPENDENT_AMBULATORY_CARE_PROVIDER_SITE_OTHER)
Admission: EM | Admit: 2015-02-15 | Discharge: 2015-02-15 | Disposition: A | Discharge status: Home / Self Care | Payer: Medicaid Other | Source: Home / Self Care | Attending: Family Medicine | Admitting: Family Medicine

## 2015-02-15 DIAGNOSIS — N390 Urinary tract infection, site not specified: Secondary | ICD-10-CM | POA: Diagnosis not present

## 2015-02-15 LAB — POCT PREGNANCY, URINE: PREG TEST UR: NEGATIVE

## 2015-02-15 LAB — POCT URINALYSIS DIP (DEVICE)
GLUCOSE, UA: 100 mg/dL — AB
KETONES UR: 15 mg/dL — AB
Nitrite: POSITIVE — AB
PH: 6.5 (ref 5.0–8.0)
Specific Gravity, Urine: 1.02 (ref 1.005–1.030)
Urobilinogen, UA: 2 mg/dL — ABNORMAL HIGH (ref 0.0–1.0)

## 2015-02-15 MED ORDER — NITROFURANTOIN MONOHYD MACRO 100 MG PO CAPS: 100.0000 mg | ORAL_CAPSULE | Freq: Two times a day (BID) | ORAL | Status: DC

## 2015-02-15 NOTE — Discharge Instructions (Signed)

## 2015-02-15 NOTE — ED Notes (Signed)
Reports having back pain 2 weeks prior to abdominal pain but has subsided.   Pt is c/o  Lower abdominal pain.  States the pain is a cramping/pulling sensation.  Reports over the past couple of months cycles have been coming early.  Mild nausea.  Denies fever, vomiting, and diarrhea.     Mild relief with tylenol.

## 2015-02-15 NOTE — ED Provider Notes (Signed)
CSN: [id number]     Arrival date & time 02/15/15  1033 History   First MD Initiated Contact with Patient 02/15/15 1129     Chief Complaint  Patient presents with  . Abdominal Pain   (Consider location/radiation/quality/duration/timing/severity/associated sxs/prior Treatment) HPI Comments: Currently menstruating  Patient is a 19 y.o. female presenting with abdominal pain. The history is provided by the patient.  Abdominal Pain Pain location:  Suprapubic Pain quality: aching   Pain severity:  Moderate Onset quality:  Gradual Duration:  2 weeks Timing:  Constant Progression:  Unchanged Chronicity:  New Relieved by:  Acetaminophen Associated symptoms: nausea and vaginal bleeding   Associated symptoms: no constipation, no diarrhea, no dysuria, no hematuria, no vaginal discharge and no vomiting   Associated symptoms comment:  +urinary frequency   Past Medical History  Diagnosis Date  . Ovarian cyst 2013  . Depression AGE 2    NO MEDS CURRENTLY  . Urinary tract infection     OCC  . Chlamydia 2010  . Gonorrhea 2010  . Sickle cell trait   . Trichomonas vaginalis infection 4/282015   Past Surgical History  Procedure Laterality Date  . No past surgeries     Family History  Problem Relation Age of Onset  . Asthma Mother   . Fibromyalgia Mother   . Asthma Brother   . Cancer Maternal Aunt     MOUTH  . Hypertension Maternal Aunt   . Heart disease Maternal Grandmother   . Hyperlipidemia Maternal Grandmother   . Hypertension Maternal Grandmother   . Arthritis Maternal Grandfather   . Diabetes Maternal Grandfather   . Hypertension Maternal Grandfather   . Kidney disease Maternal Grandfather   . Stroke Maternal Grandfather   . Diabetes Paternal Grandmother   . Birth defects Cousin     CLEFT LIP  . Cancer Cousin   . Hypertension Maternal Aunt    History  Substance Use Topics  . Smoking status: Current Every Day Smoker -- 0.25 packs/day    Types: Cigarettes  .  Smokeless tobacco: Never Used  . Alcohol Use: Yes   OB History    Gravida Para Term Preterm AB TAB SAB Ectopic Multiple Living   2 2 1 1      2      Review of Systems  Constitutional: Negative.   Respiratory: Negative.   Cardiovascular: Negative.   Gastrointestinal: Positive for nausea and abdominal pain. Negative for vomiting, diarrhea and constipation.  Genitourinary: Positive for frequency and vaginal bleeding. Negative for dysuria, hematuria, flank pain, vaginal discharge, vaginal pain and menstrual problem.  Musculoskeletal: Positive for back pain.       Reports two weeks of back pain that resolved two weeks ago.     Allergies  Tramadol  Home Medications   Prior to Admission medications   Medication Sig Start Date End Date Taking? Authorizing Provider  diphenhydrAMINE (BENADRYL) 25 MG tablet Take 1 tablet (25 mg total) by mouth every 6 (six) hours as needed for itching (Rash). 12/08/14   Kaitlyn Szekalski, PA-C  docusate sodium (COLACE) 100 MG capsule Take 1 capsule (100 mg total) by mouth 2 (two) times daily. 04/21/14   Smitty CordsAlexander J Karamalegos, DO  ibuprofen (ADVIL,MOTRIN) 600 MG tablet Take 1 tablet (600 mg total) by mouth every 6 (six) hours. 04/21/14   Smitty CordsAlexander J Karamalegos, DO  metroNIDAZOLE (FLAGYL) 500 MG tablet Take two tablets by mouth twice a day, for one day.  Or you can take all four tablets at once if  you can tolerate it. Patient not taking: Reported on 01/29/2015 04/05/14   Tereso Newcomer, MD  nitrofurantoin, macrocrystal-monohydrate, (MACROBID) 100 MG capsule Take 1 capsule (100 mg total) by mouth 2 (two) times daily. 02/15/15   Mathis Fare Presson, PA   BP 134/83 mmHg  Pulse 66  Temp(Src) 98.6 F (37 C) (Oral)  Resp 14  SpO2 97%  LMP 02/15/2015  Breastfeeding? No Physical Exam  Constitutional: She is oriented to person, place, and time. She appears well-developed and well-nourished. No distress.  HENT:  Head: Normocephalic and atraumatic.  Eyes:  Conjunctivae are normal.  Cardiovascular: Normal rate, regular rhythm and normal heart sounds.   Pulmonary/Chest: Effort normal and breath sounds normal.  Abdominal: Soft. Normal appearance and bowel sounds are normal. There is tenderness in the suprapubic area. There is no rigidity, no rebound, no guarding and no CVA tenderness.  +mild suprapubic discomfort  Musculoskeletal: Normal range of motion.  Neurological: She is alert and oriented to person, place, and time.  Skin: Skin is warm and dry. No rash noted. No erythema.  Psychiatric: She has a normal mood and affect. Her behavior is normal.  Nursing note and vitals reviewed.   ED Course  Procedures (including critical care time) Labs Review Labs Reviewed  POCT URINALYSIS DIP (DEVICE) - Abnormal; Notable for the following:    Glucose, UA 100 (*)    Bilirubin Urine MODERATE (*)    Ketones, ur 15 (*)    Hgb urine dipstick LARGE (*)    Protein, ur >=300 (*)    Urobilinogen, UA 2.0 (*)    Nitrite POSITIVE (*)    Leukocytes, UA LARGE (*)    All other components within normal limits  URINE CULTURE  POCT PREGNANCY, URINE    Imaging Review No results found.   MDM   1. UTI (lower urinary tract infection)     UPT negative UA consistent with UTI. Will send specimen for C&S and begin treatment with Macrobid and will advise if C&S results indicate need for change in therapy. Vital signs normal and exam benign.     Ria Clock, [place] 02/15/15 [phone]

## 2015-02-16 LAB — URINE CULTURE: Special Requests: NORMAL

## 2015-04-14 ENCOUNTER — Encounter (HOSPITAL_COMMUNITY): Payer: Self-pay | Admitting: *Deleted

## 2015-04-14 ENCOUNTER — Emergency Department (HOSPITAL_COMMUNITY)
Admission: EM | Admit: 2015-04-14 | Discharge: 2015-04-14 | Disposition: A | Discharge status: Home / Self Care | Payer: Medicaid Other | Attending: Emergency Medicine | Admitting: Emergency Medicine

## 2015-04-14 DIAGNOSIS — K529 Noninfective gastroenteritis and colitis, unspecified: Secondary | ICD-10-CM

## 2015-04-14 DIAGNOSIS — Z8659 Personal history of other mental and behavioral disorders: Secondary | ICD-10-CM | POA: Diagnosis not present

## 2015-04-14 DIAGNOSIS — Z87440 Personal history of urinary (tract) infections: Secondary | ICD-10-CM | POA: Insufficient documentation

## 2015-04-14 DIAGNOSIS — Z862 Personal history of diseases of the blood and blood-forming organs and certain disorders involving the immune mechanism: Secondary | ICD-10-CM | POA: Diagnosis not present

## 2015-04-14 DIAGNOSIS — Z720 Tobacco use: Secondary | ICD-10-CM | POA: Insufficient documentation

## 2015-04-14 DIAGNOSIS — Z8619 Personal history of other infectious and parasitic diseases: Secondary | ICD-10-CM | POA: Insufficient documentation

## 2015-04-14 DIAGNOSIS — R112 Nausea with vomiting, unspecified: Secondary | ICD-10-CM | POA: Diagnosis present

## 2015-04-14 DIAGNOSIS — Z3202 Encounter for pregnancy test, result negative: Secondary | ICD-10-CM | POA: Diagnosis not present

## 2015-04-14 DIAGNOSIS — Z8742 Personal history of other diseases of the female genital tract: Secondary | ICD-10-CM | POA: Insufficient documentation

## 2015-04-14 LAB — CBC WITH DIFFERENTIAL/PLATELET
BASOS ABS: 0 10*3/uL (ref 0.0–0.1)
Basophils Relative: 0 % (ref 0–1)
Eosinophils Absolute: 0.1 10*3/uL (ref 0.0–0.7)
Eosinophils Relative: 1 % (ref 0–5)
HEMATOCRIT: 31.6 % — AB (ref 36.0–46.0)
Hemoglobin: 10.7 g/dL — ABNORMAL LOW (ref 12.0–15.0)
Lymphocytes Relative: 11 % — ABNORMAL LOW (ref 12–46)
Lymphs Abs: 0.8 10*3/uL (ref 0.7–4.0)
MCH: 22.3 pg — AB (ref 26.0–34.0)
MCHC: 33.9 g/dL (ref 30.0–36.0)
MCV: 65.8 fL — ABNORMAL LOW (ref 78.0–100.0)
MONO ABS: 0.5 10*3/uL (ref 0.1–1.0)
Monocytes Relative: 6 % (ref 3–12)
NEUTROS PCT: 82 % — AB (ref 43–77)
Neutro Abs: 5.9 10*3/uL (ref 1.7–7.7)
Platelets: 354 10*3/uL (ref 150–400)
RBC: 4.8 MIL/uL (ref 3.87–5.11)
RDW: 16.3 % — ABNORMAL HIGH (ref 11.5–15.5)
WBC: 7.2 10*3/uL (ref 4.0–10.5)

## 2015-04-14 LAB — URINALYSIS, ROUTINE W REFLEX MICROSCOPIC
BILIRUBIN URINE: NEGATIVE
Glucose, UA: NEGATIVE mg/dL
HGB URINE DIPSTICK: NEGATIVE
Leukocytes, UA: NEGATIVE
NITRITE: NEGATIVE
Protein, ur: NEGATIVE mg/dL
Specific Gravity, Urine: 1.025 (ref 1.005–1.030)
Urobilinogen, UA: 1 mg/dL (ref 0.0–1.0)
pH: 6 (ref 5.0–8.0)

## 2015-04-14 LAB — COMPREHENSIVE METABOLIC PANEL
ALT: 13 U/L — ABNORMAL LOW (ref 14–54)
AST: 21 U/L (ref 15–41)
Albumin: 4.2 g/dL (ref 3.5–5.0)
Alkaline Phosphatase: 53 U/L (ref 38–126)
Anion gap: 8 (ref 5–15)
BUN: 7 mg/dL (ref 6–20)
CALCIUM: 9.2 mg/dL (ref 8.9–10.3)
CO2: 23 mmol/L (ref 22–32)
Chloride: 105 mmol/L (ref 101–111)
Creatinine, Ser: 0.71 mg/dL (ref 0.44–1.00)
GFR calc Af Amer: >60 mL/min (ref >60)
GLUCOSE: 90 mg/dL (ref 70–99)
Potassium: 3.5 mmol/L (ref 3.5–5.1)
Sodium: 136 mmol/L (ref 135–145)
TOTAL PROTEIN: 7.1 g/dL (ref 6.5–8.1)
Total Bilirubin: 0.8 mg/dL (ref 0.3–1.2)

## 2015-04-14 LAB — POC URINE PREG, ED: Preg Test, Ur: NEGATIVE

## 2015-04-14 MED ORDER — ONDANSETRON HCL 4 MG/2ML IJ SOLN
4.0000 mg | Freq: Once | INTRAMUSCULAR | Status: AC
  Administered 2015-04-14: 4 mg via INTRAVENOUS
  Filled 2015-04-14: qty 2

## 2015-04-14 MED ORDER — SODIUM CHLORIDE 0.9 % IV BOLUS (SEPSIS)
1000.0000 mL | Freq: Once | INTRAVENOUS | Status: AC
  Administered 2015-04-14: 1000 mL via INTRAVENOUS

## 2015-04-14 MED ORDER — PROMETHAZINE HCL 25 MG PO TABS: 25.0000 mg | ORAL_TABLET | Freq: Three times a day (TID) | ORAL | Status: DC | PRN

## 2015-04-14 NOTE — Discharge Instructions (Signed)
Return here as needed.  Slowly increase your fluid intake, rest as much as possible

## 2015-04-14 NOTE — ED Notes (Signed)
Pt reports waking up this am with n/v. Denies diarrhea.

## 2015-04-14 NOTE — ED Notes (Signed)
PA at bedside.

## 2015-04-14 NOTE — ED Provider Notes (Signed)
CSN: [id number]     Arrival date & time 04/14/15  1429 History   First MD Initiated Contact with Patient 04/14/15 1601     Chief Complaint  Patient presents with  . Emesis     (Consider location/radiation/quality/duration/timing/severity/associated sxs/prior Treatment) HPI Patient presents to the emergency department with nausea and vomiting that started this morning.  The patient states she woke up with nausea and vomiting has been persistent throughout the day.  The patient states that she has not had any diarrhea, chest pain, shortness breath, weakness, dizziness, headache, blurred vision, back pain, dysuria, hematuria, fever, cough, or syncope.  The patient states that she had not take any medications prior to route for her symptoms.  She states nothing seems make her condition better or worse.  She states she was unable to hold down any fluids Past Medical History  Diagnosis Date  . Ovarian cyst 2013  . Depression AGE 19    NO MEDS CURRENTLY  . Urinary tract infection     OCC  . Chlamydia 2010  . Gonorrhea 2010  . Sickle cell trait   . Trichomonas vaginalis infection 4/282015   Past Surgical History  Procedure Laterality Date  . No past surgeries     Family History  Problem Relation Age of Onset  . Asthma Mother   . Fibromyalgia Mother   . Asthma Brother   . Cancer Maternal Aunt     MOUTH  . Hypertension Maternal Aunt   . Heart disease Maternal Grandmother   . Hyperlipidemia Maternal Grandmother   . Hypertension Maternal Grandmother   . Arthritis Maternal Grandfather   . Diabetes Maternal Grandfather   . Hypertension Maternal Grandfather   . Kidney disease Maternal Grandfather   . Stroke Maternal Grandfather   . Diabetes Paternal Grandmother   . Birth defects Cousin     CLEFT LIP  . Cancer Cousin   . Hypertension Maternal Aunt    History  Substance Use Topics  . Smoking status: Current Every Day Smoker -- 0.25 packs/day    Types: Cigarettes  . Smokeless  tobacco: Never Used  . Alcohol Use: Yes   OB History    Gravida Para Term Preterm AB TAB SAB Ectopic Multiple Living   2 2 1 1      2      Review of Systems   All other systems negative except as documented in the HPI. All pertinent positives and negatives as reviewed in the HPI.= Allergies  Tramadol  Home Medications   Prior to Admission medications   Medication Sig Start Date End Date Taking? Authorizing Provider  diphenhydrAMINE (BENADRYL) 25 MG tablet Take 1 tablet (25 mg total) by mouth every 6 (six) hours as needed for itching (Rash). Patient not taking: Reported on 04/14/2015 12/08/14   Emilia BeckKaitlyn Szekalski, PA-C  docusate sodium (COLACE) 100 MG capsule Take 1 capsule (100 mg total) by mouth 2 (two) times daily. Patient not taking: Reported on 04/14/2015 04/21/14   Smitty CordsAlexander J Karamalegos, DO  ibuprofen (ADVIL,MOTRIN) 600 MG tablet Take 1 tablet (600 mg total) by mouth every 6 (six) hours. Patient not taking: Reported on 04/14/2015 04/21/14   Smitty CordsAlexander J Karamalegos, DO  metroNIDAZOLE (FLAGYL) 500 MG tablet Take two tablets by mouth twice a day, for one day.  Or you can take all four tablets at once if you can tolerate it. Patient not taking: Reported on 01/29/2015 04/05/14   Tereso NewcomerUgonna A Anyanwu, MD  nitrofurantoin, macrocrystal-monohydrate, (MACROBID) 100 MG capsule Take 1 capsule (  100 mg total) by mouth 2 (two) times daily. Patient not taking: Reported on 04/14/2015 02/15/15   Mathis FareJennifer Lee H Presson, PA   BP 115/62 mmHg  Pulse 86  Temp(Src) 98.1 F (36.7 C) (Oral)  Resp 16  SpO2 100%  LMP 04/08/2015 Physical Exam  Constitutional: She is oriented to person, place, and time. She appears well-developed and well-nourished.  HENT:  Head: Normocephalic and atraumatic.  Mouth/Throat: Oropharynx is clear and moist. No oropharyngeal exudate.  Eyes: Pupils are equal, round, and reactive to light.  Neck: Normal range of motion. Neck supple.  Cardiovascular: Normal rate, regular rhythm and normal  heart sounds.  Exam reveals no gallop and no friction rub.   No murmur heard. Pulmonary/Chest: Effort normal and breath sounds normal. No respiratory distress.  Abdominal: Soft. Bowel sounds are normal. She exhibits no distension. There is no tenderness. There is no guarding.  Musculoskeletal: She exhibits no edema.  Neurological: She is alert and oriented to person, place, and time. She exhibits normal muscle tone. Coordination normal.  Skin: Skin is warm and dry. No rash noted. No erythema.  Nursing note and vitals reviewed.   ED Course  Procedures (including critical care time) Labs Review Labs Reviewed  CBC WITH DIFFERENTIAL/PLATELET - Abnormal; Notable for the following:    Hemoglobin 10.7 (*)    HCT 31.6 (*)    MCV 65.8 (*)    MCH 22.3 (*)    RDW 16.3 (*)    Neutrophils Relative % 82 (*)    Lymphocytes Relative 11 (*)    All other components within normal limits  COMPREHENSIVE METABOLIC PANEL - Abnormal; Notable for the following:    ALT 13 (*)    All other components within normal limits  URINALYSIS, ROUTINE W REFLEX MICROSCOPIC - Abnormal; Notable for the following:    Ketones, ur >80 (*)    All other components within normal limits  POC URINE PREG, ED    Patient be treated for gastritis.  She is given IV fluids.  She states she is feeling better at this time.  She would like to go home.  She has tolerated oral fluids without difficulty.  I told her to return here for any worsening in her condition   Charlestine NightChristopher Elliott Quade, PA-C 04/14/15 2001  Gwyneth SproutWhitney Plunkett, MD 04/14/15 2248

## 2015-05-22 ENCOUNTER — Encounter (HOSPITAL_COMMUNITY): Payer: Self-pay

## 2015-05-22 ENCOUNTER — Emergency Department (HOSPITAL_COMMUNITY)
Admission: EM | Admit: 2015-05-22 | Discharge: 2015-05-22 | Discharge status: Against Medical Advice | Payer: Medicaid Other | Attending: Emergency Medicine | Admitting: Emergency Medicine

## 2015-05-22 DIAGNOSIS — Y9289 Other specified places as the place of occurrence of the external cause: Secondary | ICD-10-CM | POA: Diagnosis not present

## 2015-05-22 DIAGNOSIS — S0990XA Unspecified injury of head, initial encounter: Secondary | ICD-10-CM | POA: Diagnosis not present

## 2015-05-22 DIAGNOSIS — Y9389 Activity, other specified: Secondary | ICD-10-CM | POA: Diagnosis not present

## 2015-05-22 DIAGNOSIS — S0992XA Unspecified injury of nose, initial encounter: Secondary | ICD-10-CM | POA: Insufficient documentation

## 2015-05-22 DIAGNOSIS — Z720 Tobacco use: Secondary | ICD-10-CM | POA: Insufficient documentation

## 2015-05-22 DIAGNOSIS — Y998 Other external cause status: Secondary | ICD-10-CM | POA: Insufficient documentation

## 2015-05-22 NOTE — ED Provider Notes (Signed)
Pt eloped prior to having been seen or assessed by myself.    Signed,  Ladona Mow, PA-C 5:41 PM   Ladona Mow, PA-C 05/22/15 1741  Richardean Canal, MD 05/24/15 [phone]
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
## 2015-05-22 NOTE — ED Notes (Signed)
Pt becoming very upset, cursing at this time stating she is going to leave if the doctor does not come in soon.
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
## 2016-03-21 ENCOUNTER — Emergency Department (HOSPITAL_COMMUNITY)
Admission: EM | Admit: 2016-03-21 | Discharge: 2016-03-21 | Disposition: A | Discharge status: Home / Self Care | Payer: Medicaid Other | Attending: Emergency Medicine | Admitting: Emergency Medicine

## 2016-03-21 ENCOUNTER — Encounter (HOSPITAL_COMMUNITY): Payer: Self-pay | Admitting: Emergency Medicine

## 2016-03-21 DIAGNOSIS — Z862 Personal history of diseases of the blood and blood-forming organs and certain disorders involving the immune mechanism: Secondary | ICD-10-CM | POA: Diagnosis not present

## 2016-03-21 DIAGNOSIS — Z3202 Encounter for pregnancy test, result negative: Secondary | ICD-10-CM | POA: Insufficient documentation

## 2016-03-21 DIAGNOSIS — N390 Urinary tract infection, site not specified: Secondary | ICD-10-CM | POA: Diagnosis not present

## 2016-03-21 DIAGNOSIS — Z8619 Personal history of other infectious and parasitic diseases: Secondary | ICD-10-CM | POA: Diagnosis not present

## 2016-03-21 DIAGNOSIS — N898 Other specified noninflammatory disorders of vagina: Secondary | ICD-10-CM | POA: Diagnosis not present

## 2016-03-21 DIAGNOSIS — Z8659 Personal history of other mental and behavioral disorders: Secondary | ICD-10-CM | POA: Insufficient documentation

## 2016-03-21 DIAGNOSIS — F17210 Nicotine dependence, cigarettes, uncomplicated: Secondary | ICD-10-CM | POA: Diagnosis not present

## 2016-03-21 DIAGNOSIS — R300 Dysuria: Secondary | ICD-10-CM

## 2016-03-21 LAB — URINALYSIS, ROUTINE W REFLEX MICROSCOPIC
Bilirubin Urine: NEGATIVE
Glucose, UA: NEGATIVE mg/dL
Ketones, ur: NEGATIVE mg/dL
Nitrite: NEGATIVE
Protein, ur: 30 mg/dL — AB
Specific Gravity, Urine: 1.013 (ref 1.005–1.030)
pH: 7 (ref 5.0–8.0)

## 2016-03-21 LAB — WET PREP, GENITAL
Clue Cells Wet Prep HPF POC: NONE SEEN
Sperm: NONE SEEN
Trich, Wet Prep: NONE SEEN
Yeast Wet Prep HPF POC: NONE SEEN

## 2016-03-21 LAB — POC URINE PREG, ED: Preg Test, Ur: NEGATIVE

## 2016-03-21 LAB — URINE MICROSCOPIC-ADD ON

## 2016-03-21 LAB — GC/CHLAMYDIA PROBE AMP (~~LOC~~) NOT AT ARMC
Chlamydia: POSITIVE — AB
Neisseria Gonorrhea: POSITIVE — AB

## 2016-03-21 LAB — HIV ANTIBODY (ROUTINE TESTING W REFLEX): HIV Screen 4th Generation wRfx: NONREACTIVE

## 2016-03-21 LAB — RPR: RPR Ser Ql: NONREACTIVE

## 2016-03-21 MED ORDER — CEFTRIAXONE SODIUM 250 MG IJ SOLR
250.0000 mg | Freq: Once | INTRAMUSCULAR | Status: AC
  Administered 2016-03-21: 250 mg via INTRAMUSCULAR
  Filled 2016-03-21: qty 250

## 2016-03-21 MED ORDER — AZITHROMYCIN 250 MG PO TABS
1000.0000 mg | ORAL_TABLET | Freq: Once | ORAL | Status: AC
  Administered 2016-03-21: 1000 mg via ORAL
  Filled 2016-03-21: qty 4

## 2016-03-21 MED ORDER — LIDOCAINE HCL (PF) 1 % IJ SOLN
INTRAMUSCULAR | Status: AC
  Administered 2016-03-21: 09:00:00
  Filled 2016-03-21: qty 5

## 2016-03-21 MED ORDER — CEPHALEXIN 500 MG PO CAPS: 500.0000 mg | ORAL_CAPSULE | Freq: Two times a day (BID) | ORAL | Status: AC

## 2016-03-21 NOTE — ED Provider Notes (Signed)
CSN: [id number]     Arrival date & time 03/21/16  0701 History   First MD Initiated Contact with Patient 03/21/16 0725     Chief Complaint  Patient presents with  . SEXUALLY TRANSMITTED DISEASE     (Consider location/radiation/quality/duration/timing/severity/associated sxs/prior Treatment) HPI Comments: Patient is 20 year old female who presents for STD check. Patient reports she was exposed last week. Her sexual partner was seen in the ED 2 days ago and she was told as well. She began having a white vaginal discharge and burning on urination about 3 days ago. Patient began her period today. Her cycle is usually regular. Patient is not on any birth control, and does not use condoms. Patient denies any chest pain, shortness of breath, abdominal pain, nausea, vomiting.  The history is provided by the patient.    Past Medical History  Diagnosis Date  . Ovarian cyst 2013  . Depression AGE 20    NO MEDS CURRENTLY  . Urinary tract infection     OCC  . Chlamydia 2010  . Gonorrhea 2010  . Sickle cell trait (HCC)   . Trichomonas vaginalis infection 4/282015   Past Surgical History  Procedure Laterality Date  . No past surgeries     Family History  Problem Relation Age of Onset  . Asthma Mother   . Fibromyalgia Mother   . Asthma Brother   . Cancer Maternal Aunt     MOUTH  . Hypertension Maternal Aunt   . Heart disease Maternal Grandmother   . Hyperlipidemia Maternal Grandmother   . Hypertension Maternal Grandmother   . Arthritis Maternal Grandfather   . Diabetes Maternal Grandfather   . Hypertension Maternal Grandfather   . Kidney disease Maternal Grandfather   . Stroke Maternal Grandfather   . Diabetes Paternal Grandmother   . Birth defects Cousin     CLEFT LIP  . Cancer Cousin   . Hypertension Maternal Aunt    Social History  Substance Use Topics  . Smoking status: Current Every Day Smoker -- 0.25 packs/day    Types: Cigarettes  . Smokeless tobacco: Never Used  .  Alcohol Use: No   OB History    Gravida Para Term Preterm AB TAB SAB Ectopic Multiple Living   2 2 1 1      2      Review of Systems  Constitutional: Negative for fever and chills.  HENT: Negative for facial swelling.   Respiratory: Negative for shortness of breath.   Cardiovascular: Negative for chest pain.  Gastrointestinal: Negative for nausea, vomiting and abdominal pain.  Genitourinary: Positive for dysuria and vaginal discharge.  Musculoskeletal: Negative for back pain.  Skin: Negative for rash and wound.  Neurological: Negative for headaches.  Psychiatric/Behavioral: The patient is not nervous/anxious.       Allergies  Tramadol  Home Medications   Prior to Admission medications   Medication Sig Start Date End Date Taking? Authorizing Provider  cephALEXin (KEFLEX) 500 MG capsule Take 1 capsule (500 mg total) by mouth 2 (two) times daily. 03/21/16 03/28/16  Tejasvi Brissett M Ixel Boehning, PA-C   BP 129/77 mmHg  Pulse 74  Temp(Src) 98.3 F (36.8 C) (Oral)  Resp 16  Ht 5\' 2"  (1.575 m)  Wt 48.081 kg  BMI 19.38 kg/m2  SpO2 100% Physical Exam  Constitutional: She appears well-developed and well-nourished. No distress.  HENT:  Head: Normocephalic and atraumatic.  Mouth/Throat: Oropharynx is clear and moist. No oropharyngeal exudate.  Eyes: Conjunctivae are normal. Pupils are equal, round, and reactive  to light. Right eye exhibits no discharge. Left eye exhibits no discharge. No scleral icterus.  Neck: Normal range of motion. Neck supple. No thyromegaly present.  Cardiovascular: Normal rate, regular rhythm, normal heart sounds and intact distal pulses.  Exam reveals no gallop and no friction rub.   No murmur heard. Pulmonary/Chest: Effort normal and breath sounds normal. No stridor. No respiratory distress. She has no wheezes. She has no rales.  Abdominal: Soft. Bowel sounds are normal. She exhibits no distension. There is no tenderness. There is no rebound and no guarding.   Genitourinary: Pelvic exam was performed with patient supine. No labial fusion. There is no rash, tenderness, lesion or injury on the right labia. There is no rash, tenderness, lesion or injury on the left labia. Uterus is not enlarged and not tender. Cervix exhibits no motion tenderness, no discharge and no friability. Right adnexum displays no mass, no tenderness and no fullness. Left adnexum displays no mass, no tenderness and no fullness. No erythema or tenderness in the vagina. No foreign body around the vagina. No signs of injury around the vagina. No vaginal discharge found.  No white discharge noted, however patient began menstrual period today  Musculoskeletal: She exhibits no edema.  Lymphadenopathy:    She has no cervical adenopathy.  Neurological: She is alert. Coordination normal.  Skin: Skin is warm and dry. No rash noted. She is not diaphoretic. No pallor.  Psychiatric: She has a normal mood and affect.  Nursing note and vitals reviewed.   ED Course  Procedures (including critical care time) Labs Review Labs Reviewed  WET PREP, GENITAL - Abnormal; Notable for the following:    WBC, Wet Prep HPF POC MANY (*)    All other components within normal limits  URINALYSIS, ROUTINE W REFLEX MICROSCOPIC (NOT AT [facility]) - Abnormal; Notable for the following:    APPearance TURBID (*)    Hgb urine dipstick LARGE (*)    Protein, ur 30 (*)    Leukocytes, UA LARGE (*)    All other components within normal limits  URINE MICROSCOPIC-ADD ON - Abnormal; Notable for the following:    Squamous Epithelial / LPF 6-30 (*)    Bacteria, UA MANY (*)    All other components within normal limits  URINE CULTURE  RPR  HIV ANTIBODY (ROUTINE TESTING)  POC URINE PREG, ED  GC/CHLAMYDIA PROBE AMP () NOT AT [facility]    Imaging Review No results found. I have personally reviewed and evaluated these images and lab results as part of my medical decision-making.   EKG Interpretation None       MDM   UA shows large leukocytes and many bacteria. Hematuria most likely due to menstrual period. Urine culture ordered. Wet prep shows many white blood cells. Patient treated in the ED for STI with ceftriaxone and azithromycin. Patient also treated for UTI and discharged with Keflex. Patient advised to inform and treat all sexual partners.  Pt advised on safe sex practices and understands that they have GC/Chlamydia cultures pending and will result in 2-3 days. HIV and RPR sent. Pt encouraged to follow up at local health department for future STI checks. No concern for PID. Discussed return precautions. Pt appears safe for discharge.    Final diagnoses:  UTI (lower urinary tract infection)  Vaginal discharge  Burning with urination        Emi Holes, PA-C 03/21/16 1610  Loren Racer, MD 03/25/16 1742

## 2016-03-21 NOTE — ED Notes (Signed)
Pt verbalizes understanding of instructions. 

## 2016-03-21 NOTE — ED Notes (Signed)
States had a sexual contact tell her she needed to be tx partner was unsure what it was,  Has vag d/c and hurts to void

## 2016-03-21 NOTE — Discharge Instructions (Signed)
Medications: Keflex  Treatment: Take Keflex for your UTI as prescribed for 1 week. Use birth control and condoms to prevent sexually transmitted diseases in the future.  Follow-up: Please follow-up with your primary care provider if your symptoms worsen or do not improve. In the future, you can go to the department of health for sexual transmitted disease checks. Please return to the emergency department if he develop any new or concerning symptoms.   Urinary Tract Infection Urinary tract infections (UTIs) can develop anywhere along your urinary tract. Your urinary tract is your body's drainage system for removing wastes and extra water. Your urinary tract includes two kidneys, two ureters, a bladder, and a urethra. Your kidneys are a pair of bean-shaped organs. Each kidney is about the size of your fist. They are located below your ribs, one on each side of your spine. CAUSES Infections are caused by microbes, which are microscopic organisms, including fungi, viruses, and bacteria. These organisms are so small that they can only be seen through a microscope. Bacteria are the microbes that most commonly cause UTIs. SYMPTOMS  Symptoms of UTIs may vary by age and gender of the patient and by the location of the infection. Symptoms in young women typically include a frequent and intense urge to urinate and a painful, burning feeling in the bladder or urethra during urination. Older women and men are more likely to be tired, shaky, and weak and have muscle aches and abdominal pain. A fever may mean the infection is in your kidneys. Other symptoms of a kidney infection include pain in your back or sides below the ribs, nausea, and vomiting. DIAGNOSIS To diagnose a UTI, your caregiver will ask you about your symptoms. Your caregiver will also ask you to provide a urine sample. The urine sample will be tested for bacteria and white blood cells. White blood cells are made by your body to help fight  infection. TREATMENT  Typically, UTIs can be treated with medication. Because most UTIs are caused by a bacterial infection, they usually can be treated with the use of antibiotics. The choice of antibiotic and length of treatment depend on your symptoms and the type of bacteria causing your infection. HOME CARE INSTRUCTIONS  If you were prescribed antibiotics, take them exactly as your caregiver instructs you. Finish the medication even if you feel better after you have only taken some of the medication.  Drink enough water and fluids to keep your urine clear or pale yellow.  Avoid caffeine, tea, and carbonated beverages. They tend to irritate your bladder.  Empty your bladder often. Avoid holding urine for long periods of time.  Empty your bladder before and after sexual intercourse.  After a bowel movement, women should cleanse from front to back. Use each tissue only once. SEEK MEDICAL CARE IF:   You have back pain.  You develop a fever.  Your symptoms do not begin to resolve within 3 days. SEEK IMMEDIATE MEDICAL CARE IF:   You have severe back pain or lower abdominal pain.  You develop chills.  You have nausea or vomiting.  You have continued burning or discomfort with urination. MAKE SURE YOU:   Understand these instructions.  Will watch your condition.  Will get help right away if you are not doing well or get worse.   This information is not intended to replace advice given to you by your health care provider. Make sure you discuss any questions you have with your health care provider.   Document  Released: 09/03/2005 Document Revised: 08/15/2015 Document Reviewed: 01/02/2012 Elsevier Interactive Patient Education [employer]2016 Elsevier Inc.

## 2016-03-23 LAB — URINE CULTURE: Culture: >=100000 — AB

## 2016-03-24 ENCOUNTER — Telehealth: Payer: Self-pay | Admitting: Emergency Medicine

## 2016-03-24 ENCOUNTER — Telehealth (HOSPITAL_BASED_OUTPATIENT_CLINIC_OR_DEPARTMENT_OTHER): Payer: Self-pay | Admitting: Emergency Medicine

## 2016-03-24 NOTE — Progress Notes (Signed)
ED Antimicrobial Stewardship Positive Culture Follow Up   Julia Fontanaautica U Dubie is an 20 y.o. female who [facility] with a chief complaint of  Chief Complaint  Patient presents with  . SEXUALLY TRANSMITTED DISEASE    Recent Results (from the past 720 hour(s))  Wet prep, genital     Status: Abnormal   Collection Time: 03/21/16  8:18 AM  Result Value Ref Range Status   Yeast Wet Prep HPF POC NONE SEEN NONE SEEN Final   Trich, Wet Prep NONE SEEN NONE SEEN Final   Clue Cells Wet Prep HPF POC NONE SEEN NONE SEEN Final   WBC, Wet Prep HPF POC MANY (A) NONE SEEN Final   Sperm NONE SEEN  Final  Urine culture     Status: Abnormal   Collection Time: 03/21/16  8:51 AM  Result Value Ref Range Status   Specimen Description URINE, RANDOM  Final   Special Requests NONE  Final   Culture (A)  Final    >=100,000 COLONIES/mL STAPHYLOCOCCUS SPECIES (COAGULASE NEGATIVE)   Report Status 03/23/2016 FINAL  Final   Organism ID, Bacteria STAPHYLOCOCCUS SPECIES (COAGULASE NEGATIVE) (A)  Final      Susceptibility   Staphylococcus species (coagulase negative) - MIC*    CIPROFLOXACIN <=0.5 SENSITIVE Sensitive     GENTAMICIN <=0.5 SENSITIVE Sensitive     NITROFURANTOIN <=16 SENSITIVE Sensitive     OXACILLIN >=4 RESISTANT Resistant     TETRACYCLINE <=1 SENSITIVE Sensitive     VANCOMYCIN <=0.5 SENSITIVE Sensitive     TRIMETH/SULFA <=10 SENSITIVE Sensitive     CLINDAMYCIN <=0.25 RESISTANT Resistant     RIFAMPIN <=0.5 SENSITIVE Sensitive     Inducible Clindamycin POSITIVE Resistant     * >=100,000 COLONIES/mL STAPHYLOCOCCUS SPECIES (COAGULASE NEGATIVE)    [x]  Treated with cephalexin, organism resistant to prescribed antimicrobial []  Patient discharged originally without antimicrobial agent and treatment is now indicated  New antibiotic prescription: Bactrim DS tablet BID x 3 days  ED Provider: Noelle PennerSerena Sam, PA-C   Julia Roman 03/24/2016, 8:40 AM PharmD Candidate

## 2016-03-24 NOTE — Telephone Encounter (Signed)
Post ED Visit - Positive Culture Follow-up: Successful Patient Follow-Up  Culture assessed and recommendations reviewed by: []  Enzo BiNathan Batchelder, Pharm.D. []  Celedonio MiyamotoJeremy Frens, Pharm.D., BCPS []  Garvin FilaMike Maccia, Pharm.D. []  Georgina PillionElizabeth Martin, Pharm.D., BCPS []  Aaliah Jorgenson CenterMinh Pham, [place].D., BCPS, AAHIVP []  Estella HuskMichelle Turner, Pharm.D., BCPS, AAHIVP []  Tennis Mustassie Stewart, Pharm.D. []  Sherle Poeob Vincent, [place].Oliver Pila. Meagan Decker PharmD  Positive urine culture  []  Patient discharged without antimicrobial prescription and treatment is now indicated [x]  Organism is resistant to prescribed ED discharge antimicrobial []  Patient with positive blood cultures  Changes discussed with ED provider: Noelle PennerSerena Sam PA New antibiotic prescription Bactrim DS tablet po bid x 3 days  Attempting to contact patient    Berle MullMiller, Keaston Pile 03/24/2016, 4:17 PM

## 2016-06-21 ENCOUNTER — Telehealth: Payer: Self-pay | Admitting: *Deleted

## 2016-06-21 NOTE — Telephone Encounter (Signed)
(+)  urine culture 03/21/2016 with no response to phone call or letter sent to home.  No treatment received.

## 2016-09-15 ENCOUNTER — Encounter (HOSPITAL_COMMUNITY): Payer: Self-pay | Admitting: Emergency Medicine

## 2016-09-15 ENCOUNTER — Emergency Department (HOSPITAL_COMMUNITY)
Admission: EM | Admit: 2016-09-15 | Discharge: 2016-09-15 | Disposition: A | Discharge status: Home / Self Care | Payer: Medicaid Other | Attending: Emergency Medicine | Admitting: Emergency Medicine

## 2016-09-15 DIAGNOSIS — H6001 Abscess of right external ear: Secondary | ICD-10-CM | POA: Diagnosis not present

## 2016-09-15 DIAGNOSIS — H9201 Otalgia, right ear: Secondary | ICD-10-CM | POA: Diagnosis present

## 2016-09-15 DIAGNOSIS — F17210 Nicotine dependence, cigarettes, uncomplicated: Secondary | ICD-10-CM | POA: Insufficient documentation

## 2016-09-15 MED ORDER — LIDOCAINE HCL 2 % IJ SOLN
5.0000 mL | Freq: Once | INTRAMUSCULAR | Status: AC
  Administered 2016-09-15: 100 mg via INTRADERMAL
  Filled 2016-09-15: qty 20

## 2016-09-15 MED ORDER — HYDROCODONE-ACETAMINOPHEN 5-325 MG PO TABS: 1.0000 | ORAL_TABLET | Freq: Four times a day (QID) | ORAL | 0 refills | Status: DC | PRN

## 2016-09-15 MED ORDER — IBUPROFEN 600 MG PO TABS: 600.0000 mg | ORAL_TABLET | Freq: Four times a day (QID) | ORAL | 0 refills | Status: DC | PRN

## 2016-09-15 NOTE — Discharge Instructions (Signed)
Warm compresses at home. Follow up as needed. Return if worsening. Ibuprofen for pain. Norco for severe pain only

## 2016-09-15 NOTE — ED Provider Notes (Signed)
[facility]-EMERGENCY DEPT Provider Note   CSN: [id number] Arrival date & time: 09/15/16  1040     History   Chief Complaint Chief Complaint  Patient presents with  . Otalgia    HPI MAHKAYLA PREECE is a 20 y.o. female.  HPI RAYLI WIEDERHOLD is a 20 y.o. female Presents to emergency department complaining of right ear pain. Patient states her ear pain started approximately 3 days ago. Patient states that the pain is to the outer ear, painful when she touches it. She denies any hearing loss. Denies any drainage from the ear. Denies any similar pain in the past. She states her ear feels swollen. Denies any injuries. No fever or chills. No nasal congestion or sore throat. Denies any other associated symptoms.  Past Medical History:  Diagnosis Date  . Chlamydia 2010  . Depression AGE 5   NO MEDS CURRENTLY  . Gonorrhea 2010  . Ovarian cyst 2013  . Sickle cell trait (HCC)   . Trichomonas vaginalis infection 4/282015  . Urinary tract infection    OCC    Patient Active Problem List   Diagnosis Date Noted  . Preterm labor with preterm delivery 04/21/2014  . Fetal bilateral pyelectasis; antepartum 04/11/2014  . Marijuana use complicating pregnancy 04/05/2014  . Smoking complicating pregnancy 04/05/2014  . Trichomonal vaginitis in pregnancy in second trimester   . Chlamydia infection complicating pregnancy in second trimester 04/04/2014  . Current smoker 02/03/2013  . Marijuana use 02/03/2013  . History of depression 02/03/2013  . 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 Late prenatal care 01/27/2013    Past Surgical History:  Procedure Laterality Date  . NO PAST SURGERIES      OB History    Gravida Para Term Preterm AB Living   2 2 1 1   2    SAB TAB Ectopic Multiple Live Births           2       Home Medications    Prior to Admission medications   Not on File    Family History Family History  Problem Relation Age of Onset  . Asthma Mother   . Fibromyalgia Mother     . Asthma Brother   . Arthritis Maternal Grandfather   . Diabetes Maternal Grandfather   . Hypertension Maternal Grandfather   . Kidney disease Maternal Grandfather   . Stroke Maternal Grandfather   . Birth defects Cousin     CLEFT LIP  . Cancer Cousin   . Cancer Maternal Aunt     MOUTH  . Hypertension Maternal Aunt   . Heart disease Maternal Grandmother   . Hyperlipidemia Maternal Grandmother   . Hypertension Maternal Grandmother   . Diabetes Paternal Grandmother   . Hypertension Maternal Aunt     Social History Social History  Substance Use Topics  . Smoking status: Current Every Day Smoker    Packs/day: 0.25    Types: Cigarettes  . Smokeless tobacco: Never Used  . Alcohol use No     Allergies   Tramadol   Review of Systems Review of Systems  Constitutional: Negative for chills and fever.  HENT: Positive for ear pain. Negative for congestion, ear discharge, hearing loss and sore throat.   Respiratory: Negative for cough.   Neurological: Negative for headaches.     Physical Exam Updated Vital Signs BP 114/83 (BP Location: Left Arm)   Pulse 72   Temp 98.6 F (37 C) (Oral)   Resp 18   Ht  5\' 1"  (1.549 m)   Wt 47.2 kg   LMP 08/10/2016   SpO2 96%   BMI 19.65 kg/m   Physical Exam  Constitutional: She appears well-developed and well-nourished. No distress.  HENT:  Head: Normocephalic.  Small abscess to the inferior floor of distal right ear canal. Otherwise normal external ears bilaterally. Normal ear canals. Normal TMs bilaterally.  Eyes: Conjunctivae are normal.  Neck: Neck supple.  Cardiovascular: Normal rate, regular rhythm and normal heart sounds.   Pulmonary/Chest: Effort normal and breath sounds normal. No respiratory distress. She has no wheezes. She has no rales.  Musculoskeletal: She exhibits no edema.  Neurological: She is alert.  Skin: Skin is warm and dry.  Psychiatric: She has a normal mood and affect. Her behavior is normal.  Nursing  note and vitals reviewed.    ED Treatments / Results  Labs (all labs ordered are listed, but only abnormal results are displayed) Labs Reviewed - No data to display  EKG  EKG Interpretation None       Radiology No results found.  Procedures Procedures (including critical care time) INCISION AND DRAINAGE Performed by: Jaynie CrumbleKIRICHENKO, Brenner Visconti A Consent: Verbal consent obtained. Risks and benefits: risks, benefits and alternatives were discussed Type: abscess  Body area: right ear  Anesthesia: local infiltration  Incision was made with a scalpel.  Local anesthetic: lidocaine 2% wo epinephrine  Anesthetic total: 1 ml  Complexity: complex Blunt dissection to break up loculations  Drainage: purulent  Drainage amount: moderate  Packing material: no packing Patient tolerance: Patient tolerated the procedure well with no immediate complications.    Medications Ordered in ED Medications  lidocaine (XYLOCAINE) 2 % (with pres) injection 100 mg (100 mg Intradermal Given 09/15/16 1235)     Initial Impression / Assessment and Plan / ED Course  I have reviewed the triage vital signs and the nursing notes.  Pertinent labs & imaging results that were available during my care of the patient were reviewed by me and considered in my medical decision making (see chart for details).  Clinical Course   Patient with a small abscess of the right ear canal. Incised and drained in emergency department. Home with pain medications, warm compresses, follow up as needed.  Final Clinical Impressions(s) / ED Diagnoses   Final diagnoses:  Abscess of right ear canal    New Prescriptions New Prescriptions   HYDROCODONE-ACETAMINOPHEN (NORCO) 5-325 MG TABLET    Take 1 tablet by mouth every 6 (six) hours as needed for moderate pain.   IBUPROFEN (ADVIL,MOTRIN) 600 MG TABLET    Take 1 tablet (600 mg total) by mouth every 6 (six) hours as needed.     Jaynie Crumbleatyana Rochelle Larue, PA-C 09/15/16  1309    Gerhard Munchobert Lockwood, MD 09/15/16 1755

## 2016-09-15 NOTE — ED Triage Notes (Signed)
Patient c/o right ear pain x couple days.  Patient states pain is worse with touching.

## 2017-03-10 ENCOUNTER — Ambulatory Visit (HOSPITAL_COMMUNITY)
Admission: EM | Admit: 2017-03-10 | Discharge: 2017-03-10 | Disposition: A | Discharge status: Home / Self Care | Payer: Medicaid Other | Attending: Family Medicine | Admitting: Family Medicine

## 2017-03-10 ENCOUNTER — Encounter (HOSPITAL_COMMUNITY): Payer: Self-pay | Admitting: Emergency Medicine

## 2017-03-10 DIAGNOSIS — L42 Pityriasis rosea: Secondary | ICD-10-CM

## 2017-03-10 DIAGNOSIS — L210 Seborrhea capitis: Secondary | ICD-10-CM

## 2017-03-10 MED ORDER — HYDROXYZINE HCL 25 MG PO TABS: 25.0000 mg | ORAL_TABLET | ORAL | 0 refills | Status: DC | PRN

## 2017-03-10 NOTE — Discharge Instructions (Signed)
For itching you may take either Zyrtec, Claritin or Allegra as nondrowsy antihistamines. If you need something a little stronger take the prescription called Atarax as directed. This may cause some drowsiness. Read the instructions regarding your diagnoses. This may last several weeks.

## 2017-03-10 NOTE — ED Provider Notes (Signed)
CSN: [id number]     Arrival date & time 03/10/17  1317 History   First MD Initiated Contact with Patient 03/10/17 1358     Chief Complaint  Patient presents with  . Rash   (Consider location/radiation/quality/duration/timing/severity/associated sxs/prior Treatment) 21 year old African-American female complaining of a rash started about a week ago. She states the first lesion was a large dark spot on her lower left abdomen. This was quickly followed by several ovoid and papular lesions to the torso front and back, all 4 extremities. The chief complaint is itching.      Past Medical History:  Diagnosis Date  . Chlamydia 2010  . Depression AGE 29   NO MEDS CURRENTLY  . Gonorrhea 2010  . Ovarian cyst 2013  . Sickle cell trait (HCC)   . Trichomonas vaginalis infection 4/282015  . Urinary tract infection    OCC   Past Surgical History:  Procedure Laterality Date  . NO PAST SURGERIES     Family History  Problem Relation Age of Onset  . Asthma Mother   . Fibromyalgia Mother   . Asthma Brother   . Arthritis Maternal Grandfather   . Diabetes Maternal Grandfather   . Hypertension Maternal Grandfather   . Kidney disease Maternal Grandfather   . Stroke Maternal Grandfather   . Birth defects Cousin     CLEFT LIP  . Cancer Cousin   . Cancer Maternal Aunt     MOUTH  . Hypertension Maternal Aunt   . Heart disease Maternal Grandmother   . Hyperlipidemia Maternal Grandmother   . Hypertension Maternal Grandmother   . Diabetes Paternal Grandmother   . Hypertension Maternal Aunt    Social History  Substance Use Topics  . Smoking status: Current Every Day Smoker    Packs/day: 0.25    Types: Cigarettes  . Smokeless tobacco: Never Used  . Alcohol use No   OB History    Gravida Para Term Preterm AB Living   SAB TAB Ectopic Multiple Live Births           2     Review of Systems  Constitutional: Negative.   HENT: Negative.   Respiratory: Negative.    Gastrointestinal: Negative.   Genitourinary: Negative.   Skin: Positive for rash.       Pruritus  Neurological: Negative.   Psychiatric/Behavioral: Positive for sleep disturbance. The patient is nervous/anxious.   All other systems reviewed and are negative.   Allergies  Tramadol  Home Medications   Prior to Admission medications   Medication Sig Start Date End Date Taking? Authorizing Provider  hydrOXYzine (ATARAX/VISTARIL) 25 MG tablet Take 1 tablet (25 mg total) by mouth every 4 (four) hours as needed. For itching. May cause drowsiness. 03/10/17   Hayden Rasmussen, NP   Meds Ordered and Administered this Visit  Medications - No data to display  BP 127/64 (BP Location: Left Arm)   Pulse 70   Temp 99 F (37.2 C) (Oral)   Resp 12   SpO2 100%  No data found.   Physical Exam  Constitutional: She is oriented to person, place, and time. She appears well-developed and well-nourished. No distress.  Eyes: EOM are normal.  Neck: Neck supple.  Cardiovascular: Normal rate.   Pulmonary/Chest: Effort normal.  Musculoskeletal: Normal range of motion. She exhibits no edema.  Neurological: She is alert and oriented to person, place, and time.  Skin: Skin is warm and dry. Rash noted.  There  is a brown larger spot likely the herald spot on the left lower abdomen. There are ovoid slightly raised and rough lesions along the skin lines of the bilateral chest and abdomen that track anteriorly to the abdomen and posteriorly to the lower back.. The upper back is primarily dark papular lesions. There are several ovoid and darker lesions to the extremities.  Psychiatric: She has a normal mood and affect.  Nursing note and vitals reviewed.   Urgent Care Course     Procedures (including critical care time)  Labs Review Labs Reviewed - No data to display  Imaging Review No results found.   Visual Acuity Review  Right Eye Distance:   Left Eye Distance:   Bilateral Distance:    Right Eye  Near:   Left Eye Near:    Bilateral Near:         MDM   1. Pityriasis    For itching you may take either Zyrtec, Claritin or Allegra as nondrowsy antihistamines. If you need something a little stronger take the prescription called Atarax as directed. This may cause some drowsiness. Read the instructions regarding your diagnoses. This may last several weeks. Meds ordered this encounter  Medications  . hydrOXYzine (ATARAX/VISTARIL) 25 MG tablet    Sig: Take 1 tablet (25 mg total) by mouth every 4 (four) hours as needed. For itching. May cause drowsiness.    Dispense:  20 tablet    Refill:  0    Order Specific Question:   Supervising Provider    Answer:   Elvina Sidle [[id number]]       Hayden Rasmussen, NP 03/10/17 1415

## 2017-03-10 NOTE — ED Triage Notes (Signed)
The patient presented to the [facility] with a complaint of a rash all over her body that started 1 week ago.

## 2017-04-15 ENCOUNTER — Emergency Department (HOSPITAL_COMMUNITY): Payer: Medicaid Other

## 2017-04-15 ENCOUNTER — Emergency Department (HOSPITAL_COMMUNITY)
Admission: EM | Admit: 2017-04-15 | Discharge: 2017-04-15 | Disposition: A | Discharge status: Home / Self Care | Payer: Medicaid Other | Attending: Physician Assistant | Admitting: Physician Assistant

## 2017-04-15 ENCOUNTER — Encounter (HOSPITAL_COMMUNITY): Payer: Self-pay

## 2017-04-15 DIAGNOSIS — Y939 Activity, unspecified: Secondary | ICD-10-CM | POA: Diagnosis not present

## 2017-04-15 DIAGNOSIS — Z23 Encounter for immunization: Secondary | ICD-10-CM | POA: Insufficient documentation

## 2017-04-15 DIAGNOSIS — F17210 Nicotine dependence, cigarettes, uncomplicated: Secondary | ICD-10-CM | POA: Diagnosis not present

## 2017-04-15 DIAGNOSIS — R51 Headache: Secondary | ICD-10-CM | POA: Insufficient documentation

## 2017-04-15 DIAGNOSIS — S4992XA Unspecified injury of left shoulder and upper arm, initial encounter: Secondary | ICD-10-CM | POA: Diagnosis present

## 2017-04-15 DIAGNOSIS — Y999 Unspecified external cause status: Secondary | ICD-10-CM | POA: Diagnosis not present

## 2017-04-15 DIAGNOSIS — M25512 Pain in left shoulder: Secondary | ICD-10-CM | POA: Diagnosis not present

## 2017-04-15 DIAGNOSIS — Y92410 Unspecified street and highway as the place of occurrence of the external cause: Secondary | ICD-10-CM | POA: Diagnosis not present

## 2017-04-15 LAB — POC URINE PREG, ED: PREG TEST UR: NEGATIVE

## 2017-04-15 MED ORDER — IBUPROFEN 800 MG PO TABS: 800.0000 mg | ORAL_TABLET | Freq: Three times a day (TID) | ORAL | 0 refills | Status: DC

## 2017-04-15 MED ORDER — TETANUS-DIPHTH-ACELL PERTUSSIS 5-2.5-18.5 LF-MCG/0.5 IM SUSP
0.5000 mL | Freq: Once | INTRAMUSCULAR | Status: AC
  Administered 2017-04-15: 0.5 mL via INTRAMUSCULAR
  Filled 2017-04-15: qty 0.5

## 2017-04-15 MED ORDER — ACETAMINOPHEN 325 MG PO TABS
650.0000 mg | ORAL_TABLET | Freq: Once | ORAL | Status: AC
  Administered 2017-04-15: 650 mg via ORAL
  Filled 2017-04-15: qty 2

## 2017-04-15 MED ORDER — CYCLOBENZAPRINE HCL 10 MG PO TABS: 10.0000 mg | ORAL_TABLET | Freq: Two times a day (BID) | ORAL | 0 refills | Status: DC | PRN

## 2017-04-15 NOTE — ED Notes (Signed)
Pt sling placed.  Concern was mentioned per pt for inability to move arm.  Pt was noted to be moving arm up and down prior to discharge.  Pt also gripping ietms and carrying things with same arm.  Instructions given to pain relief and sling use.  Pt discharged home.
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
## 2017-04-15 NOTE — ED Notes (Signed)
Pt ambulated to bathroom for urine sample.

## 2017-12-26 ENCOUNTER — Ambulatory Visit (HOSPITAL_COMMUNITY)
Admission: EM | Admit: 2017-12-26 | Discharge: 2017-12-26 | Disposition: A | Discharge status: Home / Self Care | Payer: Self-pay | Attending: Family Medicine | Admitting: Family Medicine

## 2017-12-26 ENCOUNTER — Encounter (HOSPITAL_COMMUNITY): Payer: Self-pay | Admitting: *Deleted

## 2017-12-26 DIAGNOSIS — M545 Low back pain, unspecified: Secondary | ICD-10-CM

## 2017-12-26 DIAGNOSIS — R0789 Other chest pain: Secondary | ICD-10-CM

## 2017-12-26 MED ORDER — DICLOFENAC SODIUM 75 MG PO TBEC: 75.0000 mg | DELAYED_RELEASE_TABLET | Freq: Two times a day (BID) | ORAL | 0 refills | Status: DC

## 2017-12-26 NOTE — ED Triage Notes (Signed)
Per she fell down some steps, per pt she fell on her back, per pt she is unable to take in a really good deep breath. Per pt her lft wrist hurts as well.

## 2017-12-26 NOTE — Discharge Instructions (Signed)

## 2017-12-28 NOTE — ED Provider Notes (Signed)
[facility]   [id number] 12/26/17 Arrival Time: 1741  ASSESSMENT & PLAN:  1. Acute left-sided low back pain without sciatica   2. Chest wall pain     Meds ordered this encounter  Medications  . diclofenac (VOLTAREN) 75 MG EC tablet    Sig: Take 1 tablet (75 mg total) by mouth 2 (two) times daily.    Dispense:  14 tablet    Refill:  0   Discussed muscular back pain and typical duration. Will do her best to ensure adequate ROM. May f/u here if not showing improvement over the next week.  Reviewed expectations re: course of current medical issues. Questions answered. Outlined signs and symptoms indicating need for more acute intervention. Patient verbalized understanding. After Visit Summary given.   SUBJECTIVE: History from: patient.  Julia Roman is a 22 y.o. female who presents with complaint of intermittent bilateral lower back discomfort. Onset abrupt beginning yesterday. Injury/trama: yes, reports slipping and falling on her buttock/back while walking down a set of stairs. History of back problems: no. Previous back surgery: no. Discomfort described as aching without radiation. Certain movements exacerbate the described discomfort. Better with rest. Extremity sensation changes or weakness: none. Ambulatory without difficulty since fall. Normal bowel/bladder habits. No associated abdominal pain/n/v. Self treatment: has not tried OTCs for relief of pain.  ROS: As per HPI.   OBJECTIVE:  Vitals:   12/26/17 1917  BP: (!) 129/112  Pulse: 75  Temp: 97.9 F (36.6 C)  SpO2: 99%    General appearance: alert; no distress Neck: supple with FROM; without midline tenderness Lungs: unlabored respirations; symmetrical air entry Abdomen: soft, non-tender; bowel sounds normal; no masses or organomegaly; no guarding or rebound tenderness Back: bilateral lower tenderness present over bilateral paralumbar musculature; FROM at hips with mild discomfort reported; bruising:  none; without midline tenderness Extremities: no cyanosis or edema; symmetrical with no gross deformities Skin: warm and dry Neurologic: normal gait; normal symmetric reflexes; normal LE strength and sensation Psychological: alert and cooperative; normal mood and affect   Allergies  Allergen Reactions  . Tramadol Hives and Rash    Entire body rash    Past Medical History:  Diagnosis Date  . Chlamydia 2010  . Depression AGE 41   NO MEDS CURRENTLY  . Gonorrhea 2010  . Ovarian cyst 2013  . Sickle cell trait (HCC)   . Trichomonas vaginalis infection 4/282015  . Urinary tract infection    OCC   Social History   Socioeconomic History  . Marital status: Single    Spouse name: Not on file  . Number of children: Not on file  . Years of education: 11+  . Highest education level: Not on file  Social Needs  . Financial resource strain: Not on file  . Food insecurity - worry: Not on file  . Food insecurity - inability: Not on file  . Transportation needs - medical: Not on file  . Transportation needs - non-medical: Not on file  Occupational History  . Occupation: STUDENT    Employer: NOT EMPLOYED  Tobacco Use  . Smoking status: Current Every Day Smoker    Packs/day: 0.25    Types: Cigarettes  . Smokeless tobacco: Never Used  Substance and Sexual Activity  . Alcohol use: Yes    Comment: 1 times a week  . Drug use: Yes    Frequency: 7.0 times per week    Types: Marijuana  . Sexual activity: Yes    Partners: Male  Birth control/protection: None  Other Topics Concern  . Not on file  Social History Narrative  . Not on file   Family History  Problem Relation Age of Onset  . Asthma Mother   . Fibromyalgia Mother   . Asthma Brother   . Arthritis Maternal Grandfather   . Diabetes Maternal Grandfather   . Hypertension Maternal Grandfather   . Kidney disease Maternal Grandfather   . Stroke Maternal Grandfather   . Birth defects Cousin        CLEFT LIP  . Cancer  Cousin   . Cancer Maternal Aunt        MOUTH  . Hypertension Maternal Aunt   . Heart disease Maternal Grandmother   . Hyperlipidemia Maternal Grandmother   . Hypertension Maternal Grandmother   . Diabetes Paternal Grandmother   . Hypertension Maternal Aunt    Past Surgical History:  Procedure Laterality Date  . NO PAST SURGERIES       Mardella LaymanHagler, Daviyon Widmayer, MD 12/28/17 1151

## 2018-02-12 ENCOUNTER — Encounter (HOSPITAL_COMMUNITY): Payer: Self-pay | Admitting: Emergency Medicine

## 2018-02-12 ENCOUNTER — Other Ambulatory Visit: Payer: Self-pay

## 2018-02-12 ENCOUNTER — Ambulatory Visit (HOSPITAL_COMMUNITY)
Admission: EM | Admit: 2018-02-12 | Discharge: 2018-02-12 | Disposition: A | Discharge status: Home / Self Care | Payer: Medicaid Other | Attending: Internal Medicine | Admitting: Internal Medicine

## 2018-02-12 DIAGNOSIS — Z8619 Personal history of other infectious and parasitic diseases: Secondary | ICD-10-CM | POA: Insufficient documentation

## 2018-02-12 DIAGNOSIS — Z823 Family history of stroke: Secondary | ICD-10-CM | POA: Insufficient documentation

## 2018-02-12 DIAGNOSIS — Z888 Allergy status to other drugs, medicaments and biological substances status: Secondary | ICD-10-CM | POA: Insufficient documentation

## 2018-02-12 DIAGNOSIS — Z825 Family history of asthma and other chronic lower respiratory diseases: Secondary | ICD-10-CM | POA: Diagnosis not present

## 2018-02-12 DIAGNOSIS — Z8249 Family history of ischemic heart disease and other diseases of the circulatory system: Secondary | ICD-10-CM | POA: Insufficient documentation

## 2018-02-12 DIAGNOSIS — Z3202 Encounter for pregnancy test, result negative: Secondary | ICD-10-CM | POA: Diagnosis not present

## 2018-02-12 DIAGNOSIS — Z833 Family history of diabetes mellitus: Secondary | ICD-10-CM | POA: Diagnosis not present

## 2018-02-12 DIAGNOSIS — D573 Sickle-cell trait: Secondary | ICD-10-CM | POA: Diagnosis not present

## 2018-02-12 DIAGNOSIS — F17210 Nicotine dependence, cigarettes, uncomplicated: Secondary | ICD-10-CM | POA: Diagnosis not present

## 2018-02-12 DIAGNOSIS — Z808 Family history of malignant neoplasm of other organs or systems: Secondary | ICD-10-CM | POA: Diagnosis not present

## 2018-02-12 DIAGNOSIS — Z202 Contact with and (suspected) exposure to infections with a predominantly sexual mode of transmission: Secondary | ICD-10-CM

## 2018-02-12 DIAGNOSIS — Z113 Encounter for screening for infections with a predominantly sexual mode of transmission: Secondary | ICD-10-CM

## 2018-02-12 LAB — POCT PREGNANCY, URINE: PREG TEST UR: NEGATIVE

## 2018-02-12 MED ORDER — AZITHROMYCIN 250 MG PO TABS
ORAL_TABLET | ORAL | Status: AC
  Filled 2018-02-12: qty 4

## 2018-02-12 MED ORDER — LIDOCAINE HCL (PF) 1 % IJ SOLN
INTRAMUSCULAR | Status: AC
  Filled 2018-02-12: qty 2

## 2018-02-12 MED ORDER — CEFTRIAXONE SODIUM 250 MG IJ SOLR
INTRAMUSCULAR | Status: AC
  Filled 2018-02-12: qty 250

## 2018-02-12 MED ORDER — CEFTRIAXONE SODIUM 250 MG IJ SOLR
250.0000 mg | Freq: Once | INTRAMUSCULAR | Status: AC
  Administered 2018-02-12: 250 mg via INTRAMUSCULAR

## 2018-02-12 MED ORDER — AZITHROMYCIN 250 MG PO TABS
1000.0000 mg | ORAL_TABLET | Freq: Once | ORAL | Status: AC
  Administered 2018-02-12: 1000 mg via ORAL

## 2018-02-12 NOTE — Discharge Instructions (Signed)
Urine pregnancy negative.  You were treated empirically for gonorrhea and chlamydia.  Azithromycin 1g by mouth and Rocephin 250mg  injection given in office today. Cytology sent, you will be contacted with any positive results that requires further treatment. Refrain from sexual activity for the next 7 days. Monitor for any worsening of symptoms, fever, abdominal pain, nausea, vomiting, to follow up for reevaluation.

## 2018-02-12 NOTE — ED Notes (Signed)
Dirty urine collected. 

## 2018-02-12 NOTE — ED Provider Notes (Signed)
[facility]    CSN: [id number] Arrival date & time: 02/12/18  1341     History   Chief Complaint Chief Complaint  Patient presents with  . SEXUALLY TRANSMITTED DISEASE    HPI AROUSH CHASSE is a 22 y.o. female.   22 year old female comes in for possible STD exposure.  States that one of her partners complained of penile discharge and dysuria.  She is asymptomatic.  Denies fever, chills, night sweats.  Denies abdominal pain, nausea, vomiting.  Denies urinary symptoms such as frequency, dysuria, hematuria.  Denies vaginal discharge, itching/pain, spotting.  Sexually active with 2 female partners, no condom use, no other birth control use.  LMP 01/29/2018.      Past Medical History:  Diagnosis Date  . Chlamydia 2010  . Depression AGE 56   NO MEDS CURRENTLY  . Gonorrhea 2010  . Ovarian cyst 2013  . Sickle cell trait (HCC)   . Trichomonas vaginalis infection 4/282015  . Urinary tract infection    OCC    Patient Active Problem List   Diagnosis Date Noted  . Preterm labor with preterm delivery 04/21/2014  . Fetal bilateral pyelectasis; antepartum 04/11/2014  . Marijuana use complicating pregnancy 04/05/2014  . Smoking complicating pregnancy 04/05/2014  . Trichomonal vaginitis in pregnancy in second trimester   . Chlamydia infection complicating pregnancy in second trimester 04/04/2014  . Current smoker 02/03/2013  . Marijuana use 02/03/2013  . History of depression 02/03/2013  . 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 Late prenatal care 01/27/2013    Past Surgical History:  Procedure Laterality Date  . NO PAST SURGERIES      OB History    Gravida Para Term Preterm AB Living   2 2 1 1   2    SAB TAB Ectopic Multiple Live Births           2       Home Medications    Prior to Admission medications   Not on File    Family History Family History  Problem Relation Age of Onset  . Asthma Mother   . Fibromyalgia Mother   . Asthma Brother   .  Arthritis Maternal Grandfather   . Diabetes Maternal Grandfather   . Hypertension Maternal Grandfather   . Kidney disease Maternal Grandfather   . Stroke Maternal Grandfather   . Birth defects Cousin        CLEFT LIP  . Cancer Cousin   . Cancer Maternal Aunt        MOUTH  . Hypertension Maternal Aunt   . Heart disease Maternal Grandmother   . Hyperlipidemia Maternal Grandmother   . Hypertension Maternal Grandmother   . Diabetes Paternal Grandmother   . Hypertension Maternal Aunt     Social History Social History   Tobacco Use  . Smoking status: Current Every Day Smoker    Packs/day: 0.25    Types: Cigarettes  . Smokeless tobacco: Never Used  Substance Use Topics  . Alcohol use: Yes    Comment: 1 times a week  . Drug use: Yes    Frequency: 7.0 times per week    Types: Marijuana     Allergies   Tramadol   Review of Systems Review of Systems  Reason unable to perform ROS: See HPI as above.     Physical Exam Triage Vital Signs ED Triage Vitals  Enc Vitals Group     BP 02/12/18 1418 128/80     Pulse Rate 02/12/18 1418  68     Resp 02/12/18 1418 18     Temp 02/12/18 1418 98.4 F (36.9 C)     Temp Source 02/12/18 1418 Oral     SpO2 02/12/18 1418 100 %     Weight --      Height --      Head Circumference --      Peak Flow --      Pain Score 02/12/18 1416 0     Pain Loc --      Pain Edu? --      Excl. in GC? --    No data found.  Updated Vital Signs BP 128/80 (BP Location: Left Arm)   Pulse 68   Temp 98.4 F (36.9 C) (Oral)   Resp 18   LMP 01/29/2018 (Exact Date)   SpO2 100%   Physical Exam  Constitutional: She is oriented to person, place, and time. She appears well-developed and well-nourished. No distress.  HENT:  Head: Normocephalic and atraumatic.  Eyes: Conjunctivae are normal. Pupils are equal, round, and reactive to light.  Neurological: She is alert and oriented to person, place, and time.     UC Treatments / Results  Labs (all  labs ordered are listed, but only abnormal results are displayed) Labs Reviewed  POCT PREGNANCY, URINE  URINE CYTOLOGY ANCILLARY ONLY    EKG  EKG Interpretation None       Radiology No results found.  Procedures Procedures (including critical care time)  Medications Ordered in UC Medications  azithromycin (ZITHROMAX) tablet 1,000 mg (1,000 mg Oral Given 02/12/18 1544)  cefTRIAXone (ROCEPHIN) injection 250 mg (250 mg Intramuscular Given 02/12/18 1544)     Initial Impression / Assessment and Plan / UC Course  I have reviewed the triage vital signs and the nursing notes.  Pertinent labs & imaging results that were available during my care of the patient were reviewed by me and considered in my medical decision making (see chart for details).    Patient was treated empirically for GC. Azithromycin and Rocephin given in office today. Cytology sent, patient will be contacted with any positive results that require additional treatment. Patient to refrain from sexual activity for the next 7 days. Return precautions given.    Final Clinical Impressions(s) / UC Diagnoses   Final diagnoses:  Possible exposure to STD    ED Discharge Orders    None        Belinda FisherYu, Briante Loveall V, PA-C 02/12/18 1554

## 2018-02-12 NOTE — ED Triage Notes (Signed)
[facility] a [facility] to [facility]. The patient denied any symptoms.

## 2018-02-13 LAB — URINE CYTOLOGY ANCILLARY ONLY
CHLAMYDIA, DNA PROBE: NEGATIVE
NEISSERIA GONORRHEA: POSITIVE — AB
Trichomonas: POSITIVE — AB

## 2018-02-17 LAB — URINE CYTOLOGY ANCILLARY ONLY
Bacterial vaginitis: POSITIVE — AB
CANDIDA VAGINITIS: NEGATIVE

## 2018-05-05 ENCOUNTER — Inpatient Hospital Stay (HOSPITAL_COMMUNITY)
Admission: AD | Admit: 2018-05-05 | Discharge: 2018-05-06 | Disposition: A | Discharge status: Home / Self Care | Payer: Medicaid Other | Source: Ambulatory Visit | Attending: Obstetrics & Gynecology | Admitting: Obstetrics & Gynecology

## 2018-05-05 ENCOUNTER — Encounter (HOSPITAL_COMMUNITY): Payer: Self-pay

## 2018-05-05 DIAGNOSIS — M545 Low back pain, unspecified: Secondary | ICD-10-CM

## 2018-05-05 DIAGNOSIS — D573 Sickle-cell trait: Secondary | ICD-10-CM | POA: Diagnosis not present

## 2018-05-05 DIAGNOSIS — R1032 Left lower quadrant pain: Secondary | ICD-10-CM | POA: Diagnosis not present

## 2018-05-05 DIAGNOSIS — Z8619 Personal history of other infectious and parasitic diseases: Secondary | ICD-10-CM | POA: Insufficient documentation

## 2018-05-05 DIAGNOSIS — Z888 Allergy status to other drugs, medicaments and biological substances status: Secondary | ICD-10-CM | POA: Insufficient documentation

## 2018-05-05 DIAGNOSIS — M62830 Muscle spasm of back: Secondary | ICD-10-CM

## 2018-05-05 DIAGNOSIS — R1031 Right lower quadrant pain: Secondary | ICD-10-CM

## 2018-05-05 DIAGNOSIS — Z87440 Personal history of urinary (tract) infections: Secondary | ICD-10-CM | POA: Insufficient documentation

## 2018-05-05 DIAGNOSIS — F17210 Nicotine dependence, cigarettes, uncomplicated: Secondary | ICD-10-CM | POA: Insufficient documentation

## 2018-05-05 DIAGNOSIS — Z87898 Personal history of other specified conditions: Secondary | ICD-10-CM | POA: Insufficient documentation

## 2018-05-05 DIAGNOSIS — R102 Pelvic and perineal pain: Secondary | ICD-10-CM

## 2018-05-05 LAB — WET PREP, GENITAL
Clue Cells Wet Prep HPF POC: NONE SEEN
Sperm: NONE SEEN
Trich, Wet Prep: NONE SEEN
Yeast Wet Prep HPF POC: NONE SEEN

## 2018-05-05 LAB — URINALYSIS, ROUTINE W REFLEX MICROSCOPIC
Bilirubin Urine: NEGATIVE
Glucose, UA: NEGATIVE mg/dL
Hgb urine dipstick: NEGATIVE
Ketones, ur: NEGATIVE mg/dL
Leukocytes, UA: NEGATIVE
Nitrite: NEGATIVE
Protein, ur: NEGATIVE mg/dL
Specific Gravity, Urine: 1.013 (ref 1.005–1.030)
pH: 7 (ref 5.0–8.0)

## 2018-05-05 LAB — POCT PREGNANCY, URINE: PREG TEST UR: NEGATIVE

## 2018-05-05 MED ORDER — CYCLOBENZAPRINE HCL 10 MG PO TABS
10.0000 mg | ORAL_TABLET | Freq: Once | ORAL | Status: AC
  Administered 2018-05-05: 10 mg via ORAL
  Filled 2018-05-05: qty 1

## 2018-05-05 NOTE — MAU Note (Signed)
Pt reports rectal pain and pressure and back pain that started suddenly tonight. States it started after she had intercourse, did not have anal intercourse. Pt denies rectal bleeding. States she tried to have a BM but was not able to have one because the pain was so bad. Denies any recent constipation or hemorrhoids.

## 2018-05-05 NOTE — MAU Provider Note (Signed)
Chief Complaint: Abdominal Pain and Pelvic Pain   First Provider Initiated Contact with Patient 05/05/18 2314      SUBJECTIVE HPI: Julia Roman is a 22 y.o. G2P1102 not currently pregnant who presents to maternity admissions reporting abdominal pain and back pain after intercourse. She reports having intercourse one hour prior to arrival and after intercourse started having a sharp stabbing pain in her back that radiated around to her abdomen. She denies rough intercourse or changes in positions. She rates pain 7/10- has not taken any medication for pain. She is concerned about the pain, states "pain feels like I am having a miscarriage but I know I am not pregnant". She denies intercourse being with a new partner and does not want to be tested for STDs. She denies vaginal bleeding, vaginal discharge, vaginal itching/burning, urinary symptoms, h/a, dizziness, n/v, fever/chills or any other associated symptoms.     Past Medical History:  Diagnosis Date  . Chlamydia 2010  . Depression AGE 27   NO MEDS CURRENTLY  . Gonorrhea 2010  . Ovarian cyst 2013  . Sickle cell trait (HCC)   . Trichomonas vaginalis infection 4/282015  . Urinary tract infection    OCC   Past Surgical History:  Procedure Laterality Date  . NO PAST SURGERIES     Social History   Socioeconomic History  . Marital status: Single    Spouse name: Not on file  . Number of children: Not on file  . Years of education: 11+  . Highest education level: Not on file  Occupational History  . 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 Financial resource strain: Not on file  . Food insecurity:    Worry: Not on file    Inability: Not on file  . Transportation needs:    Medical: Not on file    Non-medical: Not on file  Tobacco Use  . Smoking status: Current Every Day Smoker    Packs/day: 0.25    Types: Cigarettes  . Smokeless tobacco: Never Used  Substance and Sexual Activity  . Alcohol use: Yes     Comment: 1 times a week  . Drug use: Yes    Frequency: 7.0 times per week    Types: Marijuana  . Sexual activity: Yes    Partners: Male    Birth control/protection: None  Lifestyle  . Physical activity:    Days per week: Not on file    Minutes per session: Not on file  . Stress: Not on file  Relationships  . Social connections:    Talks on phone: Not on file    Gets together: Not on file    Attends religious service: Not on file    Active member of club or organization: Not on file    Attends meetings of clubs or organizations: Not on file    Relationship status: Not on file  . Intimate partner violence:    Fear of current or ex partner: Not on file    Emotionally abused: Not on file    Physically abused: Not on file    Forced sexual activity: Not on file  Other Topics Concern  . Not on file  Social History Narrative  . Not on file   No current facility-administered medications on file prior to encounter.    No current outpatient medications on file prior to encounter.   Allergies  Allergen Reactions  . Tramadol Hives and Rash    Entire body  rash    ROS:  Review of Systems  Respiratory: Negative.   Cardiovascular: Negative.   Gastrointestinal: Positive for abdominal pain. Negative for constipation, diarrhea, nausea and vomiting.  Genitourinary: Negative.   Musculoskeletal: Positive for back pain. Negative for joint swelling and neck pain.   I have reviewed patient's Past Medical Hx, Surgical Hx, Family Hx, Social Hx, medications and allergies.   Physical Exam   Vitals:   05/05/18 2208 05/06/18 0030  BP: (!) 138/99 125/75  Pulse: 80 (!) 59  Resp: 20 18  Temp: 98.6 F (37 C) 98.6 F (37 C)  TempSrc: Oral Oral  SpO2: 97%   Weight: 105 lb (47.6 kg)   Height:  (1.6 m)     Constitutional: Well-developed, well-nourished female in no acute distress.  Cardiovascular: normal rate Respiratory: normal effort GI: Abd soft, non-tender. Pos BS x  4 Neurologic: Alert and oriented x 4.  GU: Neg CVAT.  PELVIC EXAM: Cervix pink, visually closed, without lesion, scant white creamy discharge, vaginal walls and external genitalia normal Bimanual exam: Cervix 0/long/high, firm, anterior, neg CMT, uterus nontender, nonenlarged, adnexa without tenderness, enlargement, or mass  LAB RESULTS Results for orders placed or performed during the hospital encounter of 05/05/18 (from the past 24 hour(s))  Pregnancy, urine POC     Status: None   Collection Time: 05/05/18 11:15 PM  Result Value Ref Range   Preg Test, Ur NEGATIVE NEGATIVE   Yeast Wet Prep HPF POC NONE SEEN NONE SEEN   Trich, Wet Prep NONE SEEN NONE SEEN   Clue Cells Wet Prep HPF POC NONE SEEN NONE SEEN   WBC, Wet Prep HPF POC NONE SEEN FEWAbnormal    Comment: FEW BACTERIA SEEN   Color, Urine YELLOW YELLOW   APPearance CLEAR CLEAR   Specific Gravity, Urine 1.005 - 1.030 1.013   pH 5.0 - 8.0 7.0   Glucose, UA NEGATIVE mg/dL NEGATIVE   Hgb urine dipstick NEGATIVE NEGATIVE   Bilirubin Urine NEGATIVE NEGATIVE   Ketones, ur NEGATIVE mg/dL NEGATIVE   Protein, ur NEGATIVE mg/dL NEGATIVE   Nitrite NEGATIVE NEGATIVE   Leukocytes, UA NEGATIVE NEGATIVE    Chlamydia Negative   Comment: Normal Reference Range - Negative  Neisseria gonorrhea Negative   Comment: Normal Reference Range - Negative    MAU Management/MDM: Orders Placed This Encounter  Procedures  . Wet prep, genital  . Urinalysis, Routine w reflex microscopic  . Pregnancy, urine POC   Wet prep- negative  GC/C- negative  UPT- negative  UA- negative   Meds ordered this encounter  Medications  . cyclobenzaprine (FLEXERIL) tablet 10 mg  . cyclobenzaprine (FLEXERIL) 10 MG tablet    Sig: Take 1 tablet (10 mg total) by mouth 2 (two) times daily as needed for muscle spasms.    Dispense:  10 tablet    Refill:  0    Order Specific Question:   Supervising Provider    Answer:   Levie Heritage [[id number]]    Treatments in  MAU included  Flexeril for back pain- patient reports decreased back pain and abdominal pain with medication, rates pain 2/10 after treatment. Pt discharged. Pt stable prior to discharge. Rx for Flexeril sent to pharmacy of choice. Educated on short term use of Flexeril and back exercises to stretch strained muscle, discussed not having IC for the next couple of days. Patient verbalizes understanding.   ASSESSMENT 1. Acute bilateral low back pain without sciatica   2. Back muscle spasm   3.  Bilateral lower abdominal cramping   4. Pelvic pain     PLAN Discharge home Return to urgent care or PCP for worsening symptoms  Rx for short term dosing of Flexeril  Heat and ice packs for back  Discussed Back exercises   Allergies as of 05/06/2018      Reactions   Tramadol Hives, Rash   Entire body rash      Medication List    TAKE these medications   cyclobenzaprine 10 MG tablet Commonly known as:  FLEXERIL Take 1 tablet (10 mg total) by mouth 2 (two) times daily as needed for muscle spasms.       Steward Drone  Certified Nurse-Midwife 05/07/2018  3:27 AM

## 2018-05-06 DIAGNOSIS — M545 Low back pain: Secondary | ICD-10-CM

## 2018-05-06 DIAGNOSIS — M62830 Muscle spasm of back: Secondary | ICD-10-CM | POA: Diagnosis not present

## 2018-05-06 DIAGNOSIS — R102 Pelvic and perineal pain: Secondary | ICD-10-CM

## 2018-05-06 DIAGNOSIS — R1031 Right lower quadrant pain: Secondary | ICD-10-CM

## 2018-05-06 DIAGNOSIS — R1032 Left lower quadrant pain: Secondary | ICD-10-CM | POA: Diagnosis not present

## 2018-05-06 LAB — GC/CHLAMYDIA PROBE AMP (~~LOC~~) NOT AT ARMC
Chlamydia: NEGATIVE
Neisseria Gonorrhea: NEGATIVE

## 2018-05-06 MED ORDER — CYCLOBENZAPRINE HCL 10 MG PO TABS: 10.0000 mg | ORAL_TABLET | Freq: Two times a day (BID) | ORAL | 0 refills | Status: DC | PRN

## 2018-05-06 NOTE — Discharge Instructions (Signed)

## 2018-07-18 ENCOUNTER — Encounter (HOSPITAL_COMMUNITY): Payer: Self-pay | Admitting: *Deleted

## 2018-07-18 ENCOUNTER — Inpatient Hospital Stay (HOSPITAL_COMMUNITY): Payer: Medicaid Other

## 2018-07-18 ENCOUNTER — Other Ambulatory Visit: Payer: Self-pay

## 2018-07-18 ENCOUNTER — Encounter (HOSPITAL_COMMUNITY): Payer: Self-pay | Admitting: Emergency Medicine

## 2018-07-18 ENCOUNTER — Ambulatory Visit (HOSPITAL_COMMUNITY)
Admission: EM | Admit: 2018-07-18 | Discharge: 2018-07-18 | Disposition: A | Discharge status: Home / Self Care | Payer: Medicaid Other | Attending: Family Medicine | Admitting: Family Medicine

## 2018-07-18 ENCOUNTER — Inpatient Hospital Stay (HOSPITAL_COMMUNITY)
Admission: AD | Admit: 2018-07-18 | Discharge: 2018-07-18 | Disposition: A | Discharge status: Home / Self Care | Payer: Medicaid Other | Source: Ambulatory Visit | Attending: Obstetrics and Gynecology | Admitting: Obstetrics and Gynecology

## 2018-07-18 DIAGNOSIS — N938 Other specified abnormal uterine and vaginal bleeding: Secondary | ICD-10-CM

## 2018-07-18 DIAGNOSIS — O4690 Antepartum hemorrhage, unspecified, unspecified trimester: Secondary | ICD-10-CM | POA: Diagnosis not present

## 2018-07-18 DIAGNOSIS — Z3201 Encounter for pregnancy test, result positive: Secondary | ICD-10-CM | POA: Diagnosis not present

## 2018-07-18 DIAGNOSIS — O208 Other hemorrhage in early pregnancy: Secondary | ICD-10-CM

## 2018-07-18 DIAGNOSIS — Z888 Allergy status to other drugs, medicaments and biological substances status: Secondary | ICD-10-CM | POA: Diagnosis not present

## 2018-07-18 DIAGNOSIS — O209 Hemorrhage in early pregnancy, unspecified: Secondary | ICD-10-CM | POA: Insufficient documentation

## 2018-07-18 DIAGNOSIS — F17210 Nicotine dependence, cigarettes, uncomplicated: Secondary | ICD-10-CM | POA: Diagnosis not present

## 2018-07-18 DIAGNOSIS — N926 Irregular menstruation, unspecified: Secondary | ICD-10-CM

## 2018-07-18 DIAGNOSIS — D573 Sickle-cell trait: Secondary | ICD-10-CM | POA: Diagnosis not present

## 2018-07-18 DIAGNOSIS — O283 Abnormal ultrasonic finding on antenatal screening of mother: Secondary | ICD-10-CM | POA: Insufficient documentation

## 2018-07-18 DIAGNOSIS — O3680X0 Pregnancy with inconclusive fetal viability, not applicable or unspecified: Secondary | ICD-10-CM

## 2018-07-18 DIAGNOSIS — N939 Abnormal uterine and vaginal bleeding, unspecified: Secondary | ICD-10-CM | POA: Diagnosis present

## 2018-07-18 LAB — POCT PREGNANCY, URINE: Preg Test, Ur: POSITIVE — AB

## 2018-07-18 LAB — WET PREP, GENITAL
SPERM: NONE SEEN
Trich, Wet Prep: NONE SEEN
Yeast Wet Prep HPF POC: NONE SEEN

## 2018-07-18 LAB — POCT URINALYSIS DIP (DEVICE)
Bilirubin Urine: NEGATIVE
GLUCOSE, UA: NEGATIVE mg/dL
KETONES UR: NEGATIVE mg/dL
Nitrite: NEGATIVE
PH: 7.5 (ref 5.0–8.0)
PROTEIN: NEGATIVE mg/dL
SPECIFIC GRAVITY, URINE: 1.02 (ref 1.005–1.030)
UROBILINOGEN UA: 2 mg/dL — AB (ref 0.0–1.0)

## 2018-07-18 LAB — CBC
HEMATOCRIT: 37.5 % (ref 36.0–46.0)
HEMOGLOBIN: 13.3 g/dL (ref 12.0–15.0)
MCH: 25.7 pg — AB (ref 26.0–34.0)
MCHC: 35.5 g/dL (ref 30.0–36.0)
MCV: 72.4 fL — AB (ref 78.0–100.0)
Platelets: 313 10*3/uL (ref 150–400)
RBC: 5.18 MIL/uL — ABNORMAL HIGH (ref 3.87–5.11)
RDW: 18.2 % — AB (ref 11.5–15.5)
WBC: 7.7 10*3/uL (ref 4.0–10.5)

## 2018-07-18 LAB — ABO/RH: ABO/RH(D): AB POS

## 2018-07-18 LAB — HCG, QUANTITATIVE, PREGNANCY: HCG, BETA CHAIN, QUANT, S: 557 m[IU]/mL — AB (ref <5)

## 2018-07-18 MED ORDER — CONCEPT OB 130-92.4-1 MG PO CAPS: 1.0000 | ORAL_CAPSULE | Freq: Every day | ORAL | 12 refills | Status: DC

## 2018-07-18 NOTE — Discharge Instructions (Addendum)
[facility] your bleeding as your pregnancy test is positive today.

## 2018-07-18 NOTE — ED Triage Notes (Signed)
Pt here for vaginal bleeding x 2 days; pt sts missed period

## 2018-07-18 NOTE — MAU Provider Note (Signed)
Chief Complaint: Vaginal Bleeding   First Provider Initiated Contact with Patient 07/18/18 1956     SUBJECTIVE HPI: Julia Roman is a 22 y.o. G3P1102 at [redacted]w[redacted]d by LMP who presents to Maternity Admissions reporting intermittent light to moderate Vaginal Bleeding w/ passage of possible tissue and clots since yesterday. Went to Urgent care today for the bleeding and was instructed to come to MAU. No other testing this pregnancy.    AB POS Performed at [facility], [address], [place], [place] [address]  Pain Location: Low abd  Quality: cramping Severity: Mild Duration: <24 hours Course: unchanged Context: early pregnancy Timing: intermittent  Modifying factors: Hadn't tried anything for the pain.  Associated Sx: Neg for fever, chills, dizziness, vaginal discharge, urinary complaints  Past Medical History:  Diagnosis Date  . Chlamydia 2010  . Depression AGE 52   NO MEDS CURRENTLY  . Gonorrhea 2010  . Ovarian cyst 2013  . Sickle cell trait (HCC)   . Trichomonas vaginalis infection 4/282015  . Urinary tract infection    OCC   OB History  Gravida Para Term Preterm AB Living  3 2 1 1   2   SAB TAB Ectopic Multiple Live Births          2    # Outcome Date GA Lbr Len/2nd Weight Sex Delivery Anes PTL Lv  3 Current           2 Preterm 04/19/14 [redacted]w[redacted]d 02:12 / 00:14 930 g F Vag-Spont None  LIV  1 Term 08/11/13 [redacted]w[redacted]d 03:23 / 00:26 2260 g M Vag-Spont EPI  LIV   Past Surgical History:  Procedure Laterality Date  . NO PAST SURGERIES     Social History   Socioeconomic History  . Marital status: Single    Spouse name: Not on file  . Number of children: Not on file  . Years of education: 11+  . Highest education level: Not on file  Occupational History  . 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 Financial resource strain: Not on file  . Food insecurity:    Worry: Not on file    Inability: Not on file  . Transportation needs:    Medical: Not on  file    Non-medical: Not on file  Tobacco Use  . Smoking status: Current Every Day Smoker    Packs/day: 0.25    Types: Cigarettes  . Smokeless tobacco: Never Used  Substance and Sexual Activity  . Alcohol use: Yes    Comment: 1 times a week  . Drug use: Yes    Frequency: 7.0 times per week    Types: Marijuana  . Sexual activity: Yes    Partners: Male    Birth control/protection: None  Lifestyle  . Physical activity:    Days per week: Not on file    Minutes per session: Not on file  . Stress: Not on file  Relationships  . Social connections:    Talks on phone: Not on file    Gets together: Not on file    Attends religious service: Not on file    Active member of club or organization: Not on file    Attends meetings of clubs or organizations: Not on file    Relationship status: Not on file  . Intimate partner violence:    Fear of current or ex partner: Not on file    Emotionally abused: Not on file    Physically abused: Not on  file    Forced sexual activity: Not on file  Other Topics Concern  . Not on file  Social History Narrative  . Not on file   No current facility-administered medications on file prior to encounter.    No current outpatient medications on file prior to encounter.   Allergies  Allergen Reactions  . Tramadol Hives and Rash    Entire body rash    I have reviewed the past Medical Hx, Surgical Hx, Social Hx, Allergies and Medications.   Review of Systems  Constitutional: Negative for appetite change, chills and fever.  Gastrointestinal: Positive for abdominal pain. Negative for constipation, diarrhea, nausea and vomiting.  Genitourinary: Positive for pelvic pain and vaginal bleeding. Negative for dysuria, flank pain, frequency, hematuria and vaginal discharge.  Neurological: Negative for dizziness.    OBJECTIVE Patient Vitals for the past 24 hrs:  BP Temp Temp src Pulse Resp SpO2 Height Weight  07/18/18 2036 126/78 - - (!) 59 - - - -   07/18/18 2015 126/74 - - - - - - -  07/18/18 1757 126/74 98 F (36.7 C) Oral 68 20 98 % 5\' 2"  (1.575 m) 48.5 kg   Constitutional: Well-developed, well-nourished female in no acute distress.  Cardiovascular: normal rate Respiratory: normal rate and effort.  GI: Abd soft, non-tender. MS: Extremities nontender, no edema, normal ROM Neurologic: Alert and oriented x 4.  GU: Neg CVAT.  SPECULUM EXAM: NEFG, physiologic discharge, small amount of dark red blood noted an dfragment of possible tissue coming through external os--removed w/ ring forceps, cervix clean, non-friable  BIMANUAL: cervix closed; uterus not enlarged, no adnexal tenderness or masses. No CMT.  LAB RESULTS Results for orders placed or performed during the hospital encounter of 07/18/18 (from the past 24 hour(s))  Wet prep, genital     Status: Abnormal   Collection Time: 07/18/18  6:30 PM  Result Value Ref Range   Yeast Wet Prep HPF POC NONE SEEN NONE SEEN   Trich, Wet Prep NONE SEEN NONE SEEN   Clue Cells Wet Prep HPF POC PRESENT (A) NONE SEEN   WBC, Wet Prep HPF POC FEW (A) NONE SEEN   Sperm NONE SEEN   hCG, quantitative, pregnancy     Status: Abnormal   Collection Time: 07/18/18  6:37 PM  Result Value Ref Range   hCG, Beta Chain, Quant, S 557 (H) <5 mIU/mL  CBC     Status: Abnormal   Collection Time: 07/18/18  6:37 PM  Result Value Ref Range   WBC 7.7 4.0 - 10.5 K/uL   RBC 5.18 (H) 3.87 - 5.11 MIL/uL   Hemoglobin 13.3 12.0 - 15.0 g/dL   HCT 40.937.5 81.136.0 - 91.446.0 %   MCV 72.4 (L) 78.0 - 100.0 fL   MCH 25.7 (L) 26.0 - 34.0 pg   MCHC 35.5 30.0 - 36.0 g/dL   RDW 78.218.2 (H) 95.611.5 - 21.315.5 %   Platelets 313 150 - 400 K/uL  ABO/Rh     Status: None   Collection Time: 07/18/18  6:38 PM  Result Value Ref Range   ABO/RH(D)      AB POS Performed at [facility], [address] [place], [place] [id number]     IMAGING [place] Ob Comp Less 14 Wks  Result Date: 07/18/2018 CLINICAL DATA:  Pregnant patient.  Bleeding. EXAM:  OBSTETRIC <14 WK US AND TRANSVAGINAL OB US TECHNIQUE: Both transabdominal and transvaginal ultrasound examinations were performed for complete evaluation of the gestation as well as the  maternal uterus, adnexal regions, and pelvic cul-de-sac. Transvaginal technique was performed to assess early pregnancy. COMPARISON:  None. FINDINGS: Intrauterine gestational sac: None Maternal uterus/adnexae: The ovaries are normal. No free fluid in the pelvis. The endometrium is mildly thickened and heterogeneous. IMPRESSION: 1. No IUP identified. In the setting of a positive pregnancy test, the lack of an IUP could be seen with ectopic pregnancy, recent miscarriage, or early pregnancy. Recommend clinical correlation and close follow-up. Electronically Signed   By: Gerome Sam III M.D   On: 07/18/2018 19:43   US Ob Transvaginal  Result Date: 07/18/2018 CLINICAL DATA:  Pregnant patient.  Bleeding. EXAM: OBSTETRIC <14 WK [place] AND TRANSVAGINAL OB US TECHNIQUE: Both transabdominal and transvaginal ultrasound examinations were performed for complete evaluation of the gestation as well as the maternal uterus, adnexal regions, and pelvic cul-de-sac. Transvaginal technique was performed to assess early pregnancy. COMPARISON:  None. FINDINGS: Intrauterine gestational sac: None Maternal uterus/adnexae: The ovaries are normal. No free fluid in the pelvis. The endometrium is mildly thickened and heterogeneous. IMPRESSION: 1. No IUP identified. In the setting of a positive pregnancy test, the lack of an IUP could be seen with ectopic pregnancy, recent miscarriage, or early pregnancy. Recommend clinical correlation and close follow-up. Electronically Signed   By: Gerome Sam III M.D   On: 07/18/2018 19:43    MAU COURSE CBC, Quant, ABO/Rh, ultrasound, wet prep and GC/chlamydia culture, UA, tissue sent to pathology.   Reviewed [place], lab results. Pt tearful, appropriately worried about possible failing pregnancy. Support given.  Partner at [facility], supportive. Called mother who is also supportive.   MDM Pain and bleeding in early pregnancy with pregnancy of unknown anatomic location, but hemodynamically stable.  ASSESSMENT 1. Pregnancy of unknown anatomic location   2. Vaginal bleeding affecting early pregnancy     PLAN Discharge home in stable condition. Ectopic and SAB precautions Unable to come to [facility] for F/U quant due to work schedule. Will come to MAU 07/20/18 PM. Informed that she will need to plan to stay 2 hours for results and that it is impossible to predict MAU pt volume and wait times.   Follow-up Information    THE [facility] OF  MATERNITY ADMISSIONS Follow up on 07/20/2018.   Why:  for repeat bloodwork or sooner as needed in emergencies Contact information: [address] [id number] mc [place] [address] [phone]         Allergies as of 07/18/2018      Reactions   Tramadol Hives, Rash   Entire body rash      Medication List    STOP taking these medications   cyclobenzaprine 10 MG tablet Commonly known as:  FLEXERIL     TAKE these medications   CONCEPT OB 130-92.4-1 MG Caps Take 1 tablet by mouth daily.        Katrinka Blazing, [place], [place] 07/18/2018  9:39 PM  4

## 2018-07-18 NOTE — ED Provider Notes (Signed)
[facility]    CSN: [id number] Arrival date & time: 07/18/18  1638     History   Chief Complaint Chief Complaint  Patient presents with  . Vaginal Bleeding    HPI Julia Roman is a 22 y.o. female.   Julia Roman presents with complaints of vaginal bleeding. She started having spotting last night, this morning had a large amount of bleeding and clots expelled. Bleeding has since decreased and now spotting only again. Has slight intermittent cramping. No urinary symptoms. No other vaginal discharge. No dizziness. No current abdominal pain. She has had intermittent nausea and vomiting, worse if she drinks any alcohol. States she has been drinking alcohol approximately every few days. She does smoke. States her last period was approximately June 12. She did not have a period in July. She is concerned she is pregnant but did not want to believe it, therefore she had not tested. She is not on birth control. Hx of chlamydia, gonorrhea, trichomonas, depression.  She has had two previous pregnancies, both 4 years ago.      ROS per HPI.      Past Medical History:  Diagnosis Date  . Chlamydia 2010  . Depression AGE 34   NO MEDS CURRENTLY  . Gonorrhea 2010  . Ovarian cyst 2013  . Sickle cell trait (HCC)   . Trichomonas vaginalis infection 4/282015  . Urinary tract infection    OCC    Patient Active Problem List   Diagnosis Date Noted  . Preterm labor with preterm delivery 04/21/2014  . Fetal bilateral pyelectasis; antepartum 04/11/2014  . Marijuana use complicating pregnancy 04/05/2014  . Smoking complicating pregnancy 04/05/2014  . Trichomonal vaginitis in pregnancy in second trimester   . Chlamydia infection complicating pregnancy in second trimester 04/04/2014  . Current smoker 02/03/2013  . Marijuana use 02/03/2013  . History of depression 02/03/2013  . 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 Late prenatal care 01/27/2013    Past Surgical History:  Procedure  Laterality Date  . NO PAST SURGERIES      OB History    Gravida  2   Para  2   Term  1   Preterm  1   AB      Living  2     SAB      TAB      Ectopic      Multiple      Live Births  2            Home Medications    Prior to Admission medications   Medication Sig Start Date End Date Taking? Authorizing Provider  cyclobenzaprine (FLEXERIL) 10 MG tablet Take 1 tablet (10 mg total) by mouth 2 (two) times daily as needed for muscle spasms. 05/06/18   Sharyon Cable, CNM    Family History Family History  Problem Relation Age of Onset  . Asthma Mother   . Fibromyalgia Mother   . Asthma Brother   . Arthritis Maternal Grandfather   . Diabetes Maternal Grandfather   . Hypertension Maternal Grandfather   . Kidney disease Maternal Grandfather   . Stroke Maternal Grandfather   . Birth defects Cousin        CLEFT LIP  . Cancer Cousin   . Cancer Maternal Aunt        MOUTH  . Hypertension Maternal Aunt   . Heart disease Maternal Grandmother   . Hyperlipidemia Maternal Grandmother   . Hypertension Maternal Grandmother   .  Diabetes Paternal Grandmother   . Hypertension Maternal Aunt     Social History Social History   Tobacco Use  . Smoking status: Current Every Day Smoker    Packs/day: 0.25    Types: Cigarettes  . Smokeless tobacco: Never Used  Substance Use Topics  . Alcohol use: Yes    Comment: 1 times a week  . Drug use: Yes    Frequency: 7.0 times per week    Types: Marijuana     Allergies   Tramadol   Review of Systems Review of Systems   Physical Exam Triage Vital Signs ED Triage Vitals [07/18/18 1654]  Enc Vitals Group     BP 126/78     Pulse Rate 68     Resp 18     Temp 98.2 F (36.8 C)     Temp Source Oral     SpO2 100 %     Weight      Height      Head Circumference      Peak Flow      Pain Score      Pain Loc      Pain Edu?      Excl. in GC?    No data found.  Updated Vital Signs BP 126/78 (BP Location: Left  Arm)   Pulse 68   Temp 98.2 F (36.8 C) (Oral)   Resp 18   SpO2 100%    Physical Exam  Constitutional: She is oriented to person, place, and time. She appears well-developed and well-nourished. No distress.  Cardiovascular: Normal rate, regular rhythm and normal heart sounds.  Pulmonary/Chest: Effort normal and breath sounds normal.  Neurological: She is alert and oriented to person, place, and time.  Skin: Skin is warm and dry.     UC Treatments / Results  Labs (all labs ordered are listed, but only abnormal results are displayed) Labs Reviewed  POCT URINALYSIS DIP (DEVICE) - Abnormal; Notable for the following components:      Result Value   Hgb urine dipstick SMALL (*)    Urobilinogen, UA 2.0 (*)    Leukocytes, UA TRACE (*)    All other components within normal limits  URINE CULTURE    EKG None  Radiology No results found.  Procedures Procedures (including critical care time)  Medications Ordered in UC Medications - No data to display  Initial Impression / Assessment and Plan / UC Course  I have reviewed the triage vital signs and the nursing notes.  Pertinent labs & imaging results that were available during my care of the patient were reviewed by me and considered in my medical decision making (see chart for details).     Positive urine pregnancy today in clinic with last known period in June. Bleeding with clots earlier today. Pelvic exam deferred at this time, [facility]. Patient verbalized understanding and agreeable to plan.  Safe for self transport.   Final Clinical Impressions(s) / UC Diagnoses   Final diagnoses:  Vaginal bleeding in pregnancy     Discharge Instructions     [facility] your bleeding as your pregnancy test is positive today.    ED Prescriptions    None     Controlled Substance Prescriptions [place] Controlled Substance Registry  consulted? Not Applicable   Georgetta HaberBurky, Amala Petion B, NP 07/18/18 1712

## 2018-07-18 NOTE — MAU Note (Signed)
Pt presents with c/o VB that began yesterday.  Had abdominal cramping earlier this morning, not currently.   Pt seen in urgent care, +UPT.

## 2018-07-18 NOTE — Discharge Instructions (Signed)
Vaginal Bleeding During Pregnancy, First Trimester °A small amount of bleeding (spotting) from the vagina is common in early pregnancy. Sometimes the bleeding is normal and is not a problem, and sometimes it is a sign of something serious. Be sure to tell your doctor about any bleeding from your vagina right away. °Follow these instructions at home: °· Watch your condition for any changes. °· Follow your doctor's instructions about how active you can be. °· If you are on bed rest: °? You may need to stay in bed and only get up to use the bathroom. °? You may be allowed to do some activities. °? If you need help, make plans for someone to help you. °· Write down: °? The number of pads you use each day. °? How often you change pads. °? How soaked (saturated) your pads are. °· Do not use tampons. °· Do not douche. °· Do not have sex or orgasms until your doctor says it is okay. °· If you pass any tissue from your vagina, save the tissue so you can show it to your doctor. °· Only take medicines as told by your doctor. °· Do not take aspirin because it can make you bleed. °· Keep all follow-up visits as told by your doctor. °Contact a doctor if: °· You bleed from your vagina. °· You have cramps. °· You have labor pains. °· You have a fever that does not go away after you take medicine. °Get help right away if: °· You have very bad cramps in your back or belly (abdomen). °· You pass large clots or tissue from your vagina. °· You bleed more. °· You feel light-headed or weak. °· You pass out (faint). °· You have chills. °· You are leaking fluid or have a gush of fluid from your vagina. °· You pass out while pooping (having a bowel movement). °This information is not intended to replace advice given to you by your health care provider. Make sure you discuss any questions you have with your health care provider. °Document Released: 04/10/2014 Document Revised: 05/01/2016 Document Reviewed: 08/01/2013 °Elsevier Interactive  Patient Education © 2018 Elsevier Inc. ° °

## 2018-07-19 LAB — URINE CULTURE: Culture: NO GROWTH

## 2018-07-19 LAB — GC/CHLAMYDIA PROBE AMP (~~LOC~~) NOT AT ARMC
Chlamydia: NEGATIVE
NEISSERIA GONORRHEA: NEGATIVE

## 2018-07-19 LAB — HIV ANTIBODY (ROUTINE TESTING W REFLEX): HIV SCREEN 4TH GENERATION: NONREACTIVE

## 2018-07-20 ENCOUNTER — Other Ambulatory Visit: Payer: Self-pay

## 2018-07-20 ENCOUNTER — Inpatient Hospital Stay (HOSPITAL_COMMUNITY)
Admission: AD | Admit: 2018-07-20 | Discharge: 2018-07-20 | Disposition: A | Discharge status: Home / Self Care | Payer: Medicaid Other | Source: Ambulatory Visit | Attending: Family Medicine | Admitting: Family Medicine

## 2018-07-20 DIAGNOSIS — O209 Hemorrhage in early pregnancy, unspecified: Secondary | ICD-10-CM | POA: Diagnosis present

## 2018-07-20 DIAGNOSIS — O039 Complete or unspecified spontaneous abortion without complication: Secondary | ICD-10-CM | POA: Diagnosis not present

## 2018-07-20 DIAGNOSIS — Z3A08 8 weeks gestation of pregnancy: Secondary | ICD-10-CM | POA: Insufficient documentation

## 2018-07-20 LAB — HCG, QUANTITATIVE, PREGNANCY: hCG, Beta Chain, Quant, S: 119 m[IU]/mL — ABNORMAL HIGH (ref <5)

## 2018-07-20 NOTE — MAU Note (Signed)
Doing ok.  Still bleeding, having some minor pain.

## 2018-07-20 NOTE — MAU Provider Note (Signed)
Ms. Julia Roman  is a 22 y.o. [phone] at 2349w6d who presents to MAU today for follow-up quant hCG after 48 hours. She continues to have light bleeding, but denies pain.   BP 111/70 (BP Location: Right Arm)   Pulse 63   Temp 98.2 F (36.8 C) (Oral)   Resp 18   LMP 05/19/2018 (Approximate)   SpO2 100%   CONSTITUTIONAL: Well-developed, well-nourished female in no acute distress.  MUSCULOSKELETAL: Normal range of motion.  CARDIOVASCULAR: Regular heart rate RESPIRATORY: Normal effort NEUROLOGICAL: Alert and oriented to person, place, and time.  SKIN: Skin is warm and dry. No rash noted. Not diaphoretic. No erythema. No pallor. PSYCH: Normal mood and affect. Normal behavior. Normal judgment and thought content.  Results for Julia Roman, Julia Roman (MRN [id number]) as of 07/20/2018 19:55  Ref. Range 07/18/2018 18:37 07/18/2018 18:38 07/18/2018 19:24 07/20/2018 18:57  HCG, Beta Chain, Quant, S Latest Ref Range: <5 mIU/mL 557 (H)   119 (H)    A: Miscarriage   P: Discharge home Bleeding/ectopic precautions discussed Patient referred to [facility] for follow-up labs in 1 week and follow-up with a provider in 2 weeks Patient may return to MAU as needed or if her condition were to change or worsen   Kathlene CoteWenzel, Leenah Seidner N, PA-C 07/20/2018 7:54 PM

## 2018-07-20 NOTE — Discharge Instructions (Signed)

## 2018-07-21 ENCOUNTER — Telehealth: Payer: Self-pay | Admitting: General Practice

## 2018-07-21 NOTE — Telephone Encounter (Signed)
Called to inform patient of appt scheduled for 07/27/18 at 8:50am for Lab visit and 08/03/18 at 2:15pm for SAB f/u with Vonzella NippleJulie Wenzel, PA.  Patient voiced understanding.

## 2018-07-26 ENCOUNTER — Other Ambulatory Visit: Payer: Self-pay

## 2018-07-26 DIAGNOSIS — O039 Complete or unspecified spontaneous abortion without complication: Secondary | ICD-10-CM

## 2018-07-27 ENCOUNTER — Other Ambulatory Visit: Payer: Medicaid Other

## 2018-08-01 ENCOUNTER — Ambulatory Visit (HOSPITAL_COMMUNITY)
Admission: EM | Admit: 2018-08-01 | Discharge: 2018-08-01 | Disposition: A | Discharge status: Home / Self Care | Payer: Medicaid Other

## 2018-08-01 NOTE — ED Notes (Signed)
Spoke to [employer], pa.  Discussed limitations of this location.  Patient had a miscarriage and did not follow up as scheduled.  Now patient having back pain.  [facility]  Said she came here because she did not have time to wait there.

## 2018-08-01 NOTE — ED Notes (Signed)
Gave patient a note stating we talked and recommended patient go to [facility] today

## 2018-08-03 ENCOUNTER — Ambulatory Visit: Payer: Medicaid Other | Admitting: Medical

## 2018-12-06 ENCOUNTER — Inpatient Hospital Stay (HOSPITAL_COMMUNITY): Payer: Medicaid Other

## 2018-12-06 ENCOUNTER — Encounter (HOSPITAL_COMMUNITY): Payer: Self-pay | Admitting: *Deleted

## 2018-12-06 ENCOUNTER — Inpatient Hospital Stay (HOSPITAL_COMMUNITY)
Admission: AD | Admit: 2018-12-06 | Discharge: 2018-12-07 | Disposition: A | Discharge status: Home / Self Care | Payer: Medicaid Other | Source: Ambulatory Visit | Attending: Obstetrics and Gynecology | Admitting: Obstetrics and Gynecology

## 2018-12-06 DIAGNOSIS — R109 Unspecified abdominal pain: Secondary | ICD-10-CM | POA: Diagnosis not present

## 2018-12-06 DIAGNOSIS — Z3A01 Less than 8 weeks gestation of pregnancy: Secondary | ICD-10-CM | POA: Diagnosis not present

## 2018-12-06 DIAGNOSIS — O99331 Smoking (tobacco) complicating pregnancy, first trimester: Secondary | ICD-10-CM | POA: Insufficient documentation

## 2018-12-06 DIAGNOSIS — O26891 Other specified pregnancy related conditions, first trimester: Secondary | ICD-10-CM | POA: Diagnosis not present

## 2018-12-06 DIAGNOSIS — M549 Dorsalgia, unspecified: Secondary | ICD-10-CM | POA: Diagnosis not present

## 2018-12-06 DIAGNOSIS — F17210 Nicotine dependence, cigarettes, uncomplicated: Secondary | ICD-10-CM | POA: Insufficient documentation

## 2018-12-06 DIAGNOSIS — O9989 Other specified diseases and conditions complicating pregnancy, childbirth and the puerperium: Secondary | ICD-10-CM

## 2018-12-06 DIAGNOSIS — Z3201 Encounter for pregnancy test, result positive: Secondary | ICD-10-CM

## 2018-12-06 DIAGNOSIS — Z3490 Encounter for supervision of normal pregnancy, unspecified, unspecified trimester: Secondary | ICD-10-CM

## 2018-12-06 LAB — URINALYSIS, ROUTINE W REFLEX MICROSCOPIC
Bacteria, UA: NONE SEEN
Bilirubin Urine: NEGATIVE
GLUCOSE, UA: NEGATIVE mg/dL
HGB URINE DIPSTICK: NEGATIVE
Ketones, ur: NEGATIVE mg/dL
Nitrite: NEGATIVE
PH: 7 (ref 5.0–8.0)
Protein, ur: NEGATIVE mg/dL
SPECIFIC GRAVITY, URINE: 1.023 (ref 1.005–1.030)

## 2018-12-06 LAB — CBC
HEMATOCRIT: 37.1 % (ref 36.0–46.0)
Hemoglobin: 13 g/dL (ref 12.0–15.0)
MCH: 27 pg (ref 26.0–34.0)
MCHC: 35 g/dL (ref 30.0–36.0)
MCV: 77.1 fL — ABNORMAL LOW (ref 80.0–100.0)
Platelets: 310 10*3/uL (ref 150–400)
RBC: 4.81 MIL/uL (ref 3.87–5.11)
RDW: 15.8 % — ABNORMAL HIGH (ref 11.5–15.5)
WBC: 7.1 10*3/uL (ref 4.0–10.5)
nRBC: 0 % (ref 0.0–0.2)

## 2018-12-06 LAB — POCT PREGNANCY, URINE: Preg Test, Ur: POSITIVE — AB

## 2018-12-06 MED ORDER — PREPLUS 27-1 MG PO TABS: 1.0000 | ORAL_TABLET | Freq: Every day | ORAL | 13 refills | Status: DC

## 2018-12-06 NOTE — Discharge Instructions (Signed)
First Trimester of Pregnancy  The first trimester of pregnancy is from week 1 until the end of week 13 (months 1 through 3). During this time, your baby will begin to develop inside you. At 6-8 weeks, the eyes and face are formed, and the heartbeat can be seen on ultrasound. At the end of 12 weeks, all the baby's organs are formed. Prenatal care is all the medical care you receive before the birth of your baby. Make sure you get good prenatal care and follow all of your doctor's instructions. Follow these instructions at home: Medicines  Take over-the-counter and prescription medicines only as told by your doctor. Some medicines are safe and some medicines are not safe during pregnancy.  Take a prenatal vitamin that contains at least 600 micrograms (mcg) of folic acid.  If you have trouble pooping (constipation), take medicine that will make your stool soft (stool softener) if your doctor approves. Eating and drinking   Eat regular, healthy meals.  Your doctor will tell you the amount of weight gain that is right for you.  Avoid raw meat and uncooked cheese.  If you feel sick to your stomach (nauseous) or throw up (vomit): ? Eat 4 or 5 small meals a day instead of 3 large meals. ? Try eating a few soda crackers. ? Drink liquids between meals instead of during meals.  To prevent constipation: ? Eat foods that are high in fiber, like fresh fruits and vegetables, whole grains, and beans. ? Drink enough fluids to keep your pee (urine) clear or pale yellow. Activity  Exercise only as told by your doctor. Stop exercising if you have cramps or pain in your lower belly (abdomen) or low back.  Do not exercise if it is too hot, too humid, or if you are in a place of great height (high altitude).  Try to avoid standing for long periods of time. Move your legs often if you must stand in one place for a long time.  Avoid heavy lifting.  Wear low-heeled shoes. Sit and stand up straight.   You can have sex unless your doctor tells you not to. Relieving pain and discomfort  Wear a good support bra if your breasts are sore.  Take warm water baths (sitz baths) to soothe pain or discomfort caused by hemorrhoids. Use hemorrhoid cream if your doctor says it is okay.  Rest with your legs raised if you have leg cramps or low back pain.  If you have puffy, bulging veins (varicose veins) in your legs: ? Wear support hose or compression stockings as told by your doctor. ? Raise (elevate) your feet for 15 minutes, 3-4 times a day. ? Limit salt in your food. Prenatal care  Schedule your prenatal visits by the twelfth week of pregnancy.  Write down your questions. Take them to your prenatal visits.  Keep all your prenatal visits as told by your doctor. This is important. Safety  Wear your seat belt at all times when driving.  Make a list of emergency phone numbers. The list should include numbers for family, friends, the hospital, and police and fire departments. General instructions  Ask your doctor for a referral to a local prenatal class. Begin classes no later than at the start of month 6 of your pregnancy.  Ask for help if you need counseling or if you need help with nutrition. Your doctor can give you advice or tell you where to go for help.  Do not use hot tubs, steam   rooms, or saunas.  Do not douche or use tampons or scented sanitary pads.  Do not cross your legs for long periods of time.  Avoid all herbs and alcohol. Avoid drugs that are not approved by your doctor.  Do not use any tobacco products, including cigarettes, chewing tobacco, and electronic cigarettes. If you need help quitting, ask your doctor. You may get counseling or other support to help you quit.  Avoid cat litter boxes and soil used by cats. These carry germs that can cause birth defects in the baby and can cause a loss of your baby (miscarriage) or stillbirth.  Visit your dentist. At home,  brush your teeth with a soft toothbrush. Be gentle when you floss. Contact a doctor if:  You are dizzy.  You have mild cramps or pressure in your lower belly.  You have a nagging pain in your belly area.  You continue to feel sick to your stomach, you throw up, or you have watery poop (diarrhea).  You have a bad smelling fluid coming from your vagina.  You have pain when you pee (urinate).  You have increased puffiness (swelling) in your face, hands, legs, or ankles. Get help right away if:  You have a fever.  You are leaking fluid from your vagina.  You have spotting or bleeding from your vagina.  You have very bad belly cramping or pain.  You gain or lose weight rapidly.  You throw up blood. It may look like coffee grounds.  You are around people who have German measles, fifth disease, or chickenpox.  You have a very bad headache.  You have shortness of breath.  You have any kind of trauma, such as from a fall or a car accident. Summary  The first trimester of pregnancy is from week 1 until the end of week 13 (months 1 through 3).  To take care of yourself and your unborn baby, you will need to eat healthy meals, take medicines only if your doctor tells you to do so, and do activities that are safe for you and your baby.  Keep all follow-up visits as told by your doctor. This is important as your doctor will have to ensure that your baby is healthy and growing well. This information is not intended to replace advice given to you by your health care provider. Make sure you discuss any questions you have with your health care provider. Document Released: 05/12/2008 Document Revised: 12/02/2016 Document Reviewed: 12/02/2016 Elsevier Interactive Patient Education  2019 Elsevier Inc.  

## 2018-12-06 NOTE — MAU Provider Note (Signed)
History     CSN: [id number]  Arrival date and time: 12/34782956210/19 2106   None     Chief Complaint  Patient presents with  . Back Pain   HPI Julia Roman is a 22 y.o. [phone] who present to MAU with chief complaint of back pain in the setting of positive home pregnancy test and uncertain LMP. The back pain is a new problem, onset last Saturday 12/04/18. She rates the pain as 3/10, it radiates to her lower abdomen, and she denies aggravating or alleviating factors. The patient also denies vaginal bleeding, abnormal vaginal discharge, fever, falls, or recent illness.    OB History    Gravida  4   Para  2   Term  1   Preterm  1   AB  1   Living  2     SAB  1   TAB      Ectopic      Multiple      Live Births  2           Past Medical History:  Diagnosis Date  . Chlamydia 2010  . Depression AGE 18   NO MEDS CURRENTLY  . Gonorrhea 2010  . Ovarian cyst 2013  . Sickle cell trait (HCC)   . Trichomonas vaginalis infection 4/282015  . Urinary tract infection    OCC    Past Surgical History:  Procedure Laterality Date  . NO PAST SURGERIES      Family History  Problem Relation Age of Onset  . Asthma Mother   . Fibromyalgia Mother   . Asthma Brother   . Arthritis Maternal Grandfather   . Diabetes Maternal Grandfather   . Hypertension Maternal Grandfather   . Kidney disease Maternal Grandfather   . Stroke Maternal Grandfather   . Birth defects Cousin        CLEFT LIP  . Cancer Cousin   . Cancer Maternal Aunt        MOUTH  . Hypertension Maternal Aunt   . Heart disease Maternal Grandmother   . Hyperlipidemia Maternal Grandmother   . Hypertension Maternal Grandmother   . Diabetes Paternal Grandmother   . Hypertension Maternal Aunt     Social History   Tobacco Use  . Smoking status: Current Every Day Smoker    Packs/day: 0.25    Types: Cigarettes  . Smokeless tobacco: Never Used  Substance Use Topics  . Alcohol use: Yes    Comment: 1 times a  week  . Drug use: Yes    Frequency: 7.0 times per week    Types: Marijuana    Allergies:  Allergies  Allergen Reactions  . Tramadol Hives and Rash    Entire body rash    Medications Prior to Admission  Medication Sig Dispense Refill Last Dose  . Prenat w/o A Vit-FeFum-FePo-FA (CONCEPT OB) 130-92.4-1 MG CAPS Take 1 tablet by mouth daily. 30 capsule 12     Review of Systems  Constitutional: Negative for chills, fatigue and fever.  Gastrointestinal: Positive for abdominal pain. Negative for nausea and vomiting.  Genitourinary: Negative for vaginal bleeding, vaginal discharge and vaginal pain.  Musculoskeletal: Positive for back pain.  All other systems reviewed and are negative.  Physical Exam   Blood pressure 123/74, pulse 72, temperature 98.4 F (36.9 C), temperature source Oral, resp. rate 18, height 5\' 2"  (1.575 m), weight 48.4 kg, unknown if currently breastfeeding.  Physical Exam  Nursing note and vitals reviewed. Constitutional: She is oriented to  person, place, and time. She appears well-developed and well-nourished.  Cardiovascular: Normal rate.  Respiratory: Effort normal.  GI: Soft. She exhibits no distension. There is no abdominal tenderness. There is no rebound and no guarding.  Neurological: She is alert and oriented to person, place, and time.  Skin: Skin is warm and dry.  Psychiatric: She has a normal mood and affect. Her behavior is normal. Judgment and thought content normal.    MAU Course/MDM    Patient Vitals for the past 24 hrs:  BP Temp Temp src Pulse Resp Height Weight  12/06/18 2125 123/74 98.4 F (36.9 C) Oral 72 18 5\' 2"  (1.575 m) 48.4 kg    Results for orders placed or performed during the hospital encounter of 12/06/18 (from the past 24 hour(s))  Urinalysis, Routine w reflex microscopic     Status: Abnormal   Collection Time: 12/06/18  9:37 PM  Result Value Ref Range   Color, Urine YELLOW YELLOW   APPearance HAZY (A) CLEAR   Specific  Gravity, Urine 1.023 1.005 - 1.030   pH 7.0 5.0 - 8.0   Glucose, UA NEGATIVE NEGATIVE mg/dL   Hgb urine dipstick NEGATIVE NEGATIVE   Bilirubin Urine NEGATIVE NEGATIVE   Ketones, ur NEGATIVE NEGATIVE mg/dL   Protein, ur NEGATIVE NEGATIVE mg/dL   Nitrite NEGATIVE NEGATIVE   Leukocytes, UA TRACE (A) NEGATIVE   RBC / HPF 0-5 0 - 5 RBC/hpf   WBC, UA 6-10 0 - 5 WBC/hpf   Bacteria, UA NONE SEEN NONE SEEN   Squamous Epithelial / LPF 6-10 0 - 5  Pregnancy, urine POC     Status: Abnormal   Collection Time: 12/06/18  9:41 PM  Result Value Ref Range   Preg Test, Ur POSITIVE (A) NEGATIVE  CBC     Status: Abnormal   Collection Time: 12/06/18 10:37 PM  Result Value Ref Range   WBC 7.1 4.0 - 10.5 K/uL   RBC 4.81 3.87 - 5.11 MIL/uL   Hemoglobin 13.0 12.0 - 15.0 g/dL   HCT 16.137.1 09.636.0 - 04.546.0 %   MCV 77.1 (L) 80.0 - 100.0 fL   MCH 27.0 26.0 - 34.0 pg   MCHC 35.0 30.0 - 36.0 g/dL   RDW 40.915.8 (H) 81.111.5 - 91.415.5 %   Platelets 310 150 - 400 K/uL   nRBC 0.0 0.0 - 0.2 %    [place] Ob Less Than 14 Weeks With Ob Transvaginal  Result Date: 12/06/2018 CLINICAL DATA:  22 year old pregnant female with abdominal and back pain. Quantitative beta HCG is pending. EDC by LMP: 08/10/2019, projecting to an expected gestational age of [redacted] weeks 5 days. EXAM: OBSTETRIC <14 WK US AND TRANSVAGINAL OB US TECHNIQUE: Both transabdominal and transvaginal ultrasound examinations were performed for complete evaluation of the gestation as well as the maternal uterus, adnexal regions, and pelvic cul-de-sac. Transvaginal technique was performed to assess early pregnancy. COMPARISON:  No prior scans from this gestation. FINDINGS: Intrauterine gestational sac: Single intrauterine gestational sac appears normal in shape and location. Yolk sac:  Visualized. Embryo:  Not Visualized. Cardiac Activity: Not Visualized. MSD: 4.8 mm   5 w   1 d Subchorionic hemorrhage:  None visualized. Maternal uterus/adnexae: Anteverted uterus with no uterine fibroids.  Left ovary measures 3.6 x 1.9 x 2.1 cm and contains a corpus luteum. Right ovary measures 3.3 x 1.2 x 1.9 cm. No abnormal ovarian or adnexal masses. No abnormal free fluid in the pelvis. IMPRESSION: 1. Single intrauterine gestational sac with yolk sac at 5  weeks 1 day by mean sac diameter, concordant with provided menstrual dating. No embryo detected at this time, which could be due to early gestational age. Recommend follow-up obstetric scan in 11-14 days for definitive diagnosis. This recommendation follows SRU consensus guidelines: Diagnostic Criteria for Nonviable Pregnancy Early in the First Trimester. Malva Limes Med 2013; 161:0960-45. 2. No ovarian or adnexal abnormality. Electronically Signed   By: Delbert Phenix M.D.   On: 12/06/2018 23:30     Assessment and Plan  --22 y.o. [id number] SIUP at 5w 1d --Reviewed first trimester precautions --Paper rx prenatal vitamins --Discharge home in stable condition  Calvert Cantor, CNM 12/06/2018, 11:53 PM

## 2018-12-06 NOTE — MAU Note (Signed)
PT SAYS LAST PREG  WAS SAB- NOT REALLY 28 WEEKS AS COMPUTER SHOWS. -  DID NOT COME BACK FOR FOLLOW-UP.      SHE STARTED BACK  LAST Monday.   ON SAT - POSITIVE  UPT

## 2018-12-07 LAB — TYPE AND SCREEN
ABO/RH(D): AB POS
ANTIBODY SCREEN: NEGATIVE

## 2018-12-07 LAB — HCG, QUANTITATIVE, PREGNANCY: hCG, Beta Chain, Quant, S: 2852 m[IU]/mL — ABNORMAL HIGH (ref <5)

## 2018-12-08 NOTE — L&D Delivery Note (Signed)
Delivery Note At 6:36 PM a viable female was delivered via Vaginal, Spontaneous (Presentation: vertex, ROA).  APGAR: 9, 9; weight pending.   Placenta status: spontaneous, intact.  Cord: 3vc with the following complications: none.  Cord pH: n/a  Anesthesia: epidural  Episiotomy: None Lacerations: None Suture Repair: n/a Est. Blood Loss (mL): 154  Mom to postpartum.  Baby to Couplet care / Skin to Skin.  Truett Mainland 08/09/2019, 6:55 PM

## 2018-12-10 ENCOUNTER — Telehealth: Payer: Self-pay | Admitting: Family Medicine

## 2018-12-10 NOTE — Telephone Encounter (Signed)
Called in to make an appointment to start prenatal care.

## 2018-12-10 NOTE — Telephone Encounter (Signed)
Called the patient to confirm the change in appointment times. No answer, left a message. Also sending an appointment reminder to the patient

## 2019-01-17 ENCOUNTER — Ambulatory Visit (INDEPENDENT_AMBULATORY_CARE_PROVIDER_SITE_OTHER): Payer: Medicaid Other | Admitting: Nurse Practitioner

## 2019-01-17 ENCOUNTER — Encounter: Payer: Medicaid Other | Admitting: Obstetrics & Gynecology

## 2019-01-17 ENCOUNTER — Encounter: Payer: Self-pay | Admitting: Nurse Practitioner

## 2019-01-17 ENCOUNTER — Other Ambulatory Visit (HOSPITAL_COMMUNITY)
Admission: RE | Admit: 2019-01-17 | Discharge: 2019-01-17 | Disposition: A | Discharge status: Home / Self Care | Payer: Medicaid Other | Source: Ambulatory Visit | Attending: Obstetrics & Gynecology | Admitting: Obstetrics & Gynecology

## 2019-01-17 VITALS — BP 122/74 | HR 72 | Wt 111.7 lb

## 2019-01-17 DIAGNOSIS — O09212 Supervision of pregnancy with history of pre-term labor, second trimester: Secondary | ICD-10-CM

## 2019-01-17 DIAGNOSIS — A599 Trichomoniasis, unspecified: Secondary | ICD-10-CM

## 2019-01-17 DIAGNOSIS — Z3A10 10 weeks gestation of pregnancy: Secondary | ICD-10-CM

## 2019-01-17 DIAGNOSIS — O0990 Supervision of high risk pregnancy, unspecified, unspecified trimester: Secondary | ICD-10-CM | POA: Insufficient documentation

## 2019-01-17 DIAGNOSIS — O09211 Supervision of pregnancy with history of pre-term labor, first trimester: Secondary | ICD-10-CM

## 2019-01-17 DIAGNOSIS — O210 Mild hyperemesis gravidarum: Secondary | ICD-10-CM

## 2019-01-17 DIAGNOSIS — O0991 Supervision of high risk pregnancy, unspecified, first trimester: Secondary | ICD-10-CM

## 2019-01-17 MED ORDER — VITAFOL GUMMIES 3.33-0.333-34.8 MG PO CHEW: 3.0000 | CHEWABLE_TABLET | Freq: Every day | ORAL | 11 refills | Status: DC

## 2019-01-17 MED ORDER — DOXYLAMINE-PYRIDOXINE ER 20-20 MG PO TBCR: EXTENDED_RELEASE_TABLET | ORAL | 3 refills | Status: AC

## 2019-01-17 NOTE — Patient Instructions (Addendum)
[email or web address]  Call 1-800-QUIT -NOW  Safe Medications in Pregnancy   Acne: Benzoyl Peroxide Salicylic Acid  Backache/Headache: Tylenol: 2 regular strength every 4 hours OR              2 Extra strength every 6 hours  Colds/Coughs/Allergies: Benadryl (alcohol free) 25 mg every 6 hours as needed Breath right strips Claritin Cepacol throat lozenges Chloraseptic throat spray Cold-Eeze- up to three times per day Cough drops, alcohol free Flonase  Guaifenesin Mucinex Robitussin DM (plain only, alcohol free) Saline nasal spray/drops Sudafed (pseudoephedrine) & Actifed ** use only after [redacted] weeks gestation and if you do not have high blood pressure Tylenol Vicks Vaporub Zinc lozenges Zyrtec   Constipation: Colace Ducolax suppositories Fleet enema Glycerin suppositories Metamucil Milk of magnesia Miralax Senokot Smooth move tea  Diarrhea: Kaopectate Imodium A-D  *NO pepto Bismol  Hemorrhoids: Anusol Anusol HC Preparation H Tucks  Indigestion: Tums Maalox Mylanta Zantac  Pepcid  Insomnia: Benadryl (alcohol free) 25mg  every 6 hours as needed Tylenol PM Unisom, no Gelcaps  Leg Cramps: Tums MagGel  Nausea/Vomiting:  Bonine Dramamine Emetrol Ginger extract Sea bands Meclizine  Nausea medication to take during pregnancy:  Unisom (doxylamine succinate 25 mg tablets) Take one tablet daily at bedtime. If symptoms are not adequately controlled, the dose can be increased to a maximum recommended dose of two tablets daily (1/2 tablet in the morning, 1/2 tablet mid-afternoon and one at bedtime). Vitamin B6 100mg  tablets. Take one tablet twice a day (up to 200 mg per day).  Skin Rashes: Aveeno products Benadryl cream or 25mg  every 6 hours as needed Calamine Lotion 1% cortisone cream  Yeast infection: Gyne-lotrimin 7 Monistat 7   **If taking multiple medications, please check labels to avoid duplicating the same active ingredients **take medication  as directed on the label ** Do not exceed 4000 mg of tylenol in 24 hours **Do not take medications that contain aspirin or ibuprofen    BENEFITS OF BREASTFEEDING Many women wonder if they should breastfeed. Research shows that breast milk contains the perfect balance of vitamins, protein and fat that your baby needs to grow. It also contains antibodies that help your baby's immune system to fight off viruses and bacteria and can reduce the risk of sudden infant death syndrome (SIDS). In addition, the colostrum (a fluid secreted from the breast in the first few days after delivery) helps your newborn's digestive system to grow and function well. Breast milk is easier to digest than formula. Also, if your baby is born preterm, breast milk can help to reduce both short- and long-term health problems. BENEFITS OF BREASTFEEDING FOR MOM . Breastfeeding causes a hormone to be released that helps the uterus to contract and return to its normal size more quickly. . It aids in postpartum weight loss, reduces risk of breast and ovarian cancer, heart disease and rheumatoid arthritis. . It decreases the amount of bleeding after the baby is born. benefits of breastfeeding for baby . Provides comfort and nutrition . Protects baby against - Obesity - Diabetes - Asthma - Childhood cancers - Heart disease - Ear infections - Diarrhea - Pneumonia - Stomach problems - Serious allergies - Skin rashes . Promotes growth and development . Reduces the risk of baby having Sudden Infant Death Syndrome (SIDS) only breastmilk for the first 6 months . Protects baby against diseases/allergies . It's the perfect amount for tiny bellies . It restores baby's energy . Provides the best nutrition for baby . Giving water or formula  can make baby more likely to get sick, decrease Mom's milk supply, make baby less content with breastfeeding Skin to Skin After delivery, the staff will place your baby on your chest. This  helps with the following: . Regulates baby's temperature, breathing, heart rate and blood sugar . Increases Mom's milk supply . Promotes bonding . Keeps baby and Mom calm and decreases baby's crying Rooming In Your baby will stay in your room with you for the entire time you are in the hospital. This helps with the following: . Allows Mom to learn baby's feeding cues - Fluttering eyes - Sucking on tongue or hand - Rooting (opens mouth and turns head) - Nuzzling into the breast - Bringing hand to mouth . Allows breastfeeding on demand (when your baby is ready) . Helps baby to be calm and content . Ensures a good milk supply . Prevents complications with breastfeeding . Allows parents to learn to care for baby . Allows you to request assistance with breastfeeding Importance of a good latch . Increases milk transfer to baby - baby gets enough milk . Ensures you have enough milk for your baby . Decreases nipple soreness . Don't use pacifiers and bottles - these cause baby to suck differently than breastfeeding . Promotes continuation of breastfeeding Risks of Formula Supplementation with Breastfeeding Giving your infant formula in addition to your breast-milk EXCEPT when medically necessary can lead to: Marland Kitchen. Decreases your milk supply  . Loss of confidence in yourself for providing baby's nutrition  . Engorgement and possibly mastitis  . Asthma & allergies in the baby BREASTFEEDING FAQS How long should I breastfeed my baby? It is recommended that you provide your baby with breast milk only for the first 6 months and then continue for the first year and longer as desired. During the first few weeks after birth, your baby will need to feed 8-12 times every 24 hours, or every 2-3 hours. They will likely feed for 15-30 minutes. How can I help my baby begin breastfeeding? Babies are born with an instinct to breastfeed. A healthy baby can begin breastfeeding right away without specific help. At  the hospital, a nurse (or lactation consultant) will help you begin the process and will give you tips on good positioning. It may be helpful to take a breastfeeding class before you deliver in order to know what to expect. How can I help my baby latch on? In order to assist your baby in latching-on, cup your breast in your hand and stroke your baby's lower lip with your nipple to stimulate your baby's rooting reflex. Your baby will look like he or she is yawning, at which point you should bring the baby towards your breast, while aiming the nipple at the roof of his or her mouth. Remember to bring the baby towards you and not your breast towards the baby. How can I tell if my baby is latched-on? Your baby will have all of your nipple and part of the dark area around the nipple in his or her mouth and your baby's nose will be touching your breast. You should see or hear the baby swallowing. If the baby is not latched-on properly, start the process over. To remove the suction, insert a clean finger between your breast and the baby's mouth. Should I switch breasts during feeding? After feeding on one side, switch the baby to your other breast. If he or she does not continue feeding - that is OK. Your baby will not necessarily  need to feed from both breasts in a single feeding. On the next feeding, start with the other breast for efficiency and comfort. How can I tell if my baby is hungry? When your baby is hungry, they will nuzzle against your breast, make sucking noises and tongue motions and may put their hands near their mouth. Crying is a late sign of hunger, so you should not wait until this point. When they have received enough milk, they will unlatch from the breast. Is it okay to use a pacifier? Until your baby gets the hang of breastfeeding, experts recommend limiting pacifier usage. If you have questions about this, please contact your pediatrician. What can I do to ensure proper nutrition while  breastfeeding? . Make sure that you support your own health and your baby's by eating a healthy, well-balanced diet . Your provider may recommend that you continue to take your prenatal vitamin . Drink plenty of fluids. It is a good rule to drink one glass of water before or after feeding . Alcohol will remain in the breast milk for as long as it will remain in the blood stream. If you choose to have a drink, it is recommended that you wait at least 2 hours before feeding . Moderate amounts of caffeine are OK . Some over-the-counter or prescription medications are not recommended during breastfeeding. Check with your provider if you have questions What types of birth control methods are safe while breastfeeding? Progestin-only methods, including a daily pill, an IUD, the implant and the injection are safe while breastfeeding. Methods that contain estrogen (such as combination birth control pills, the vaginal ring and the patch) should not be used during the first month of breastfeeding as these can decrease your milk supply.

## 2019-01-17 NOTE — Progress Notes (Signed)
Subjective:   Julia Roman is a 23 y.o. [id number] at 5231w5d by LMP and an early ultrasound which confimrms that date being seen today for her first obstetrical visit.  Her obstetrical history is significant for previous preterm birth at 25 weeks with preterm dialiation and spontaneous birth with previous positive GBS. Patient does intend to breast feed. Pregnancy history fully reviewed.  Patient reports no appetite and nausea.  Did not eat any breakfast today.Marland Kitchen.  HISTORY: OB History  Gravida Para Term Preterm AB Living  4 2 1 1 1 2   SAB TAB Ectopic Multiple Live Births  1 0 0 0 2    # Outcome Date GA Lbr Len/2nd Weight Sex Delivery Anes PTL Lv  4 Current           3 SAB 06/06/18 8827w4d         2 Preterm 04/19/14 1890w2d 02:12 / 00:14 2 lb 0.8 oz (0.93 kg) F Vag-Spont None  LIV     Name: Cardinal,GIRL Quintessa     Apgar1: 7  Apgar5: 7  1 Term 08/11/13 4653w0d 03:23 / 00:26 4 lb 15.7 oz (2.26 kg) M Vag-Spont EPI  LIV     Name: Drozdowski,BOY Theresia     Apgar1: 8  Apgar5: 9   Past Medical History:  Diagnosis Date  . Chlamydia 2010  . Depression AGE 23   NO MEDS CURRENTLY  . Gonorrhea 2010  . Ovarian cyst 2013  . Sickle cell trait (HCC)   . Trichomonas vaginalis infection 4/282015  . Urinary tract infection    OCC   Past Surgical History:  Procedure Laterality Date  . NO PAST SURGERIES     Family History  Problem Relation Age of Onset  . Asthma Mother   . Fibromyalgia Mother   . Asthma Brother   . Arthritis Maternal Grandfather   . Diabetes Maternal Grandfather   . Hypertension Maternal Grandfather   . Kidney disease Maternal Grandfather   . Stroke Maternal Grandfather   . Birth defects Cousin        CLEFT LIP  . Cancer Cousin   . Cancer Maternal Aunt        MOUTH  . Hypertension Maternal Aunt   . Heart disease Maternal Grandmother   . Hyperlipidemia Maternal Grandmother   . Hypertension Maternal Grandmother   . Diabetes Paternal Grandmother   . Hypertension Maternal  Aunt    Social History   Tobacco Use  . Smoking status: Current Every Day Smoker    Packs/day: 0.25    Types: Cigarettes  . Smokeless tobacco: Never Used  Substance Use Topics  . Alcohol use: Yes    Comment: 1 times a week  . Drug use: Yes    Frequency: 7.0 times per week    Types: Marijuana   Allergies  Allergen Reactions  . Tramadol Hives and Rash    Entire body rash   Current Outpatient Medications on File Prior to Visit  Medication Sig Dispense Refill  . Prenatal Vit-Fe Fumarate-FA (PREPLUS) 27-1 MG TABS Take 1 tablet by mouth daily. 30 tablet 13   No current facility-administered medications on file prior to visit.      Exam   Vitals:   01/17/19 1001  BP: 122/74  Pulse: 72  Weight: 111 lb 11.2 oz (50.7 kg)   Fetal Heart Rate (bpm): 164  Uterus:  Fundal Height: 10 cm  Pelvic Exam: Perineum: no hemorrhoids, normal perineum   Vulva: normal external genitalia,  no lesions   Vagina:  normal mucosa, normal discharge   Cervix: no lesions and normal, pap smear done. Closed, thick and firm   Adnexa: normal adnexa and no mass, fullness, tenderness   Bony Pelvis: average  System: General: well-developed, well-nourished female in no acute distress   Breast:  normal appearance, no masses or tenderness   Skin: normal coloration and turgor, no rashes   Neurologic: oriented, normal, negative, normal mood   Extremities: normal strength, tone, and muscle mass, ROM of all joints is normal   HEENT extraocular movement intact and sclera clear, anicteric   Mouth/Teeth mucous membranes moist, pharynx normal without lesions and dental hygiene is poor with plaque and possible cavities in back molars   Neck supple and no masses, normal thyroid   Cardiovascular: regular rate and rhythm   Respiratory:  no respiratory distress, normal breath sounds   Abdomen: soft, non-tender; no masses,  no organomegaly     Assessment:   Pregnancy: [id number] Patient Active Problem List    Diagnosis Date Noted  . Supervision of high risk pregnancy, antepartum 01/17/2019  . Current pregnancy with history of preterm labor 04/21/2014  . Smoking complicating pregnancy 04/05/2014  . Current smoker 02/03/2013  . Marijuana use 02/03/2013  . History of depression 02/03/2013  . 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 Supervision of high risk pregnancy, antepartum Discussed stopping smoking and needs to stop all marijuana use 0 needs to see [facility] provider in the office but was not possible at visit today.  Will see her next visit. Discussed 5-6 mini meals daily to avoid nausea.  - Culture, OB Urine - Obstetric Panel, Including HIV - Hemoglobinopathy Evaluation - Cytology - PAP( [place]) - [place] MFM OB DETAIL +14 WK; Future  2. Current pregnancy with history of pre-term labor in second trimester  Plans to use 17P - will sign consent and arrange at next visit - 14 weeks  Initial labs drawn. Continue prenatal vitamins. Genetic Screening discussed, at low risk and did not order:  Ultrasound discussed; fetal anatomic survey: ordered. Problem list reviewed and updated. The nature of [place] [facility] with multiple MDs and other Advanced Practice Providers was explained to patient; also emphasized that residents, students are part of our team. Routine obstetric precautions reviewed. Return in about 4 weeks (around 02/14/2019) for [facility] for MD visit.  Total face-to-face time with patient: 40 minutes.  Over 50% of encounter was spent on counseling and coordination of care.     Nolene Bernheim, FNP Family Nurse Practitioner, [facility] for [employer], [facility] 01/17/2019 11:46 AM

## 2019-01-18 LAB — CYTOLOGY - PAP
Chlamydia: NEGATIVE
Diagnosis: NEGATIVE
NEISSERIA GONORRHEA: NEGATIVE

## 2019-01-19 ENCOUNTER — Encounter: Payer: Self-pay | Admitting: Nurse Practitioner

## 2019-01-19 ENCOUNTER — Telehealth: Payer: Self-pay | Admitting: General Practice

## 2019-01-19 DIAGNOSIS — A5901 Trichomonal vulvovaginitis: Secondary | ICD-10-CM | POA: Insufficient documentation

## 2019-01-19 DIAGNOSIS — O23599 Infection of other part of genital tract in pregnancy, unspecified trimester: Secondary | ICD-10-CM

## 2019-01-19 HISTORY — DX: Trichomonal vulvovaginitis: A59.01

## 2019-01-19 LAB — CULTURE, OB URINE

## 2019-01-19 LAB — URINE CULTURE, OB REFLEX: Organism ID, Bacteria: NO GROWTH

## 2019-01-19 MED ORDER — METRONIDAZOLE 500 MG PO TABS: 500.0000 mg | ORAL_TABLET | Freq: Two times a day (BID) | ORAL | 0 refills | Status: DC

## 2019-01-19 NOTE — Telephone Encounter (Signed)
-----   Message from Currie Paris, NP sent at 01/19/2019  8:22 AM EST ----- Please call patient and inform that she needs to be treated for trichomonas and her partner(s) needs to be treated as well.  No sex until 10 days after they are both treated.  This is important as a vaginal infection can make her more prone to a preterm birth.  Medication sent to her pharmacy.

## 2019-01-19 NOTE — Addendum Note (Signed)
Addended by: Currie Paris on: 01/19/2019 08:23 AM   Modules accepted: Orders

## 2019-01-19 NOTE — Telephone Encounter (Signed)
Called patient, no answer- left message to call us back as soon as possible for urgent results.

## 2019-01-19 NOTE — Telephone Encounter (Signed)
Called patient, no answer- left message to call us back for urgent results. Will send letter.

## 2019-01-20 ENCOUNTER — Telehealth: Payer: Self-pay | Admitting: *Deleted

## 2019-01-20 NOTE — Telephone Encounter (Signed)
Received a voicemail from [facility] pharmacy they are calling to get a prior [place] for [place]. States if any questions to call back at [phone]

## 2019-01-20 NOTE — Telephone Encounter (Signed)
Patient has medicaid, Medicaid prior auth for [place] completed and faxed. Also called pharmacy and left message I have  Completed prior auth.

## 2019-01-27 LAB — OBSTETRIC PANEL, INCLUDING HIV
Antibody Screen: NEGATIVE
Basophils Absolute: 0 10*3/uL (ref 0.0–0.2)
Basos: 0 %
EOS (ABSOLUTE): 0.2 10*3/uL (ref 0.0–0.4)
Eos: 2 %
HEMOGLOBIN: 10.9 g/dL — AB (ref 11.1–15.9)
HIV Screen 4th Generation wRfx: NONREACTIVE
Hematocrit: 32.1 % — ABNORMAL LOW (ref 34.0–46.6)
Hepatitis B Surface Ag: NEGATIVE
IMMATURE GRANULOCYTES: 0 %
Immature Grans (Abs): 0 10*3/uL (ref 0.0–0.1)
Lymphocytes Absolute: 2.4 10*3/uL (ref 0.7–3.1)
Lymphs: 32 %
MCH: 26.8 pg (ref 26.6–33.0)
MCHC: 34 g/dL (ref 31.5–35.7)
MCV: 79 fL (ref 79–97)
Monocytes Absolute: 0.7 10*3/uL (ref 0.1–0.9)
Monocytes: 9 %
NEUTROS PCT: 57 %
Neutrophils Absolute: 4.2 10*3/uL (ref 1.4–7.0)
Platelets: 264 10*3/uL (ref 150–450)
RBC: 4.06 x10E6/uL (ref 3.77–5.28)
RDW: 15.6 % — ABNORMAL HIGH (ref 11.7–15.4)
RH TYPE: POSITIVE
RPR Ser Ql: NONREACTIVE
Rubella Antibodies, IGG: 1.91 index (ref >0.99)
WBC: 7.5 10*3/uL (ref 3.4–10.8)

## 2019-01-27 LAB — HEMOGLOBINOPATHY EVALUATION
Ferritin: 22 ng/mL (ref 15–150)
Hgb A2 Quant: 3.1 % (ref 1.8–3.2)
Hgb A: 60 % — ABNORMAL LOW (ref 96.4–98.8)
Hgb C: 36.9 % — ABNORMAL HIGH
Hgb F Quant: 0 % (ref 0.0–2.0)
Hgb S: 0 %
Hgb Solubility: NEGATIVE
Hgb Variant: 0 %

## 2019-01-27 LAB — ALPHA-THALASSEMIA

## 2019-02-14 ENCOUNTER — Ambulatory Visit (INDEPENDENT_AMBULATORY_CARE_PROVIDER_SITE_OTHER): Payer: Medicaid Other | Admitting: Family Medicine

## 2019-02-14 VITALS — BP 132/81 | HR 86 | Wt 112.5 lb

## 2019-02-14 DIAGNOSIS — O26892 Other specified pregnancy related conditions, second trimester: Secondary | ICD-10-CM

## 2019-02-14 DIAGNOSIS — O0990 Supervision of high risk pregnancy, unspecified, unspecified trimester: Secondary | ICD-10-CM

## 2019-02-14 DIAGNOSIS — D582 Other hemoglobinopathies: Secondary | ICD-10-CM

## 2019-02-14 DIAGNOSIS — Z3A14 14 weeks gestation of pregnancy: Secondary | ICD-10-CM

## 2019-02-14 DIAGNOSIS — O09212 Supervision of pregnancy with history of pre-term labor, second trimester: Secondary | ICD-10-CM

## 2019-02-14 DIAGNOSIS — A599 Trichomoniasis, unspecified: Secondary | ICD-10-CM

## 2019-02-14 MED ORDER — METRONIDAZOLE 500 MG PO TABS: 1000.0000 mg | ORAL_TABLET | Freq: Two times a day (BID) | ORAL | 0 refills | Status: AC

## 2019-02-14 MED ORDER — HYDROXYPROGESTERONE CAPROATE 250 MG/ML IM OIL: 250.0000 mg | TOPICAL_OIL | Freq: Once | INTRAMUSCULAR | 0 refills | Status: DC

## 2019-02-14 NOTE — Progress Notes (Signed)
   PRENATAL VISIT NOTE  Subjective:  Julia Roman is a 23 y.o. [phone] at [redacted]w[redacted]d being seen today for ongoing prenatal care.  She is currently monitored for the following issues for this low-risk pregnancy and has Current smoker; Marijuana use; History of depression; Hemoglobin C trait (HCC); Smoking complicating pregnancy; Current pregnancy with history of preterm labor; Supervision of high risk pregnancy, antepartum; and Trichomonal vaginitis during pregnancy on their problem list.  Patient reports no complaints.  Contractions: Not present. Vag. Bleeding: None.  Movement: Absent. Denies leaking of fluid.   The following portions of the patient's history were reviewed and updated as appropriate: allergies, current medications, past family history, past medical history, past social history, past surgical history and problem list.   Objective:   Vitals:   02/14/19 1436  BP: 132/81  Pulse: 86  Weight: 112 lb 8 oz (51 kg)    Fetal Status: Fetal Heart Rate (bpm): 156   Movement: Absent     General:  Alert, oriented and cooperative. Patient is in no acute distress.  Skin: Skin is warm and dry. No rash noted.   Cardiovascular: Normal heart rate noted  Respiratory: Normal respiratory effort, no problems with respiration noted  Abdomen: Soft, gravid, appropriate for gestational age.  Pain/Pressure: Absent     Pelvic: Cervical exam deferred        Extremities: Normal range of motion.  Edema: None  Mental Status: Normal mood and affect. Normal behavior. Normal judgment and thought content.   Assessment and Plan:  Pregnancy: [id number] at [redacted]w[redacted]d 1. Current pregnancy with history of pre-term labor in second trimester Desires Makena, shared decision making discussed. - hydroxyprogesterone caproate (MAKENA) 250 mg/mL OIL injection; Inject 1 mL (250 mg total) into the muscle once for 1 dose.  Dispense: 1 mL; Refill: 0  2. Supervision of high risk pregnancy, antepartum Desires NIPT - Genetic  Screening  3. Hemoglobin C trait (HCC) FOB has not been tested  4. Trichomonal infection Treatment given with expedited partner therapy - metroNIDAZOLE (FLAGYL) 500 MG tablet; Take 2 tablets (1,000 mg total) by mouth 2 (two) times daily for 1 day. No alcohol while taking this medication  Dispense: 8 tablet; Refill: 0  Preterm labor symptoms and general obstetric precautions including but not limited to vaginal bleeding, contractions, leaking of fluid and fetal movement were reviewed in detail with the patient. Please refer to After Visit Summary for other counseling recommendations.   Return in 4 weeks (on 03/14/2019) for 17 P weekly.  Future Appointments  Date Time Provider Department Center  03/14/2019  2:15 PM Noralee Chars [facility]  03/16/2019 10:00 AM [facility] [place] 3 [facility]    Reva Bores, MD

## 2019-02-14 NOTE — Patient Instructions (Signed)

## 2019-02-15 ENCOUNTER — Encounter: Payer: Self-pay | Admitting: Emergency Medicine

## 2019-02-15 NOTE — Progress Notes (Signed)
Compounding pharmacy contacted and [facility] ordered for pt.

## 2019-02-16 NOTE — Progress Notes (Unsigned)
Message sent to front office to schedule pt for first Makena appt in one week.

## 2019-02-18 ENCOUNTER — Encounter (HOSPITAL_COMMUNITY): Payer: Self-pay | Admitting: Emergency Medicine

## 2019-02-18 ENCOUNTER — Emergency Department (HOSPITAL_COMMUNITY)
Admission: EM | Admit: 2019-02-18 | Discharge: 2019-02-18 | Disposition: A | Discharge status: Home / Self Care | Payer: Medicaid Other | Attending: Emergency Medicine | Admitting: Emergency Medicine

## 2019-02-18 DIAGNOSIS — O26892 Other specified pregnancy related conditions, second trimester: Secondary | ICD-10-CM | POA: Insufficient documentation

## 2019-02-18 DIAGNOSIS — O99332 Smoking (tobacco) complicating pregnancy, second trimester: Secondary | ICD-10-CM | POA: Insufficient documentation

## 2019-02-18 DIAGNOSIS — Z3A14 14 weeks gestation of pregnancy: Secondary | ICD-10-CM | POA: Insufficient documentation

## 2019-02-18 DIAGNOSIS — Z79899 Other long term (current) drug therapy: Secondary | ICD-10-CM | POA: Diagnosis not present

## 2019-02-18 DIAGNOSIS — F17210 Nicotine dependence, cigarettes, uncomplicated: Secondary | ICD-10-CM | POA: Insufficient documentation

## 2019-02-18 DIAGNOSIS — J069 Acute upper respiratory infection, unspecified: Secondary | ICD-10-CM | POA: Diagnosis not present

## 2019-02-18 LAB — URINALYSIS, ROUTINE W REFLEX MICROSCOPIC
BILIRUBIN URINE: NEGATIVE
Glucose, UA: NEGATIVE mg/dL
Hgb urine dipstick: NEGATIVE
KETONES UR: 20 mg/dL — AB
LEUKOCYTE UA: NEGATIVE
NITRITE: NEGATIVE
Protein, ur: NEGATIVE mg/dL
Specific Gravity, Urine: 1.011 (ref 1.005–1.030)
pH: 6 (ref 5.0–8.0)

## 2019-02-18 LAB — INFLUENZA PANEL BY PCR (TYPE A & B)
Influenza A By PCR: NEGATIVE
Influenza B By PCR: NEGATIVE

## 2019-02-18 NOTE — ED Triage Notes (Addendum)
Patient here from home with complaints of fever and generalized body aches that started at 1am. [redacted] weeks pregnant. 1000mg  Tylenol given.

## 2019-02-18 NOTE — ED Notes (Signed)
Reviewed discharge summary pt continued to curse and states that she is in pain, pt made aware of diagnosis and medication

## 2019-02-18 NOTE — ED Provider Notes (Signed)
[facility]-EMERGENCY DEPT Provider Note   CSN: [id number] Arrival date & time: 02/18/19  0859    History   Chief Complaint Chief Complaint  Patient presents with  . Fever  . Chills  . Routine Prenatal Visit    HPI Julia Roman is a 23 y.o. female.     HPI Patient is a 23 year old female who presents to the emergency department with complaints of generalized myalgias and fever since approximately 1 AM.  She is almost [redacted] weeks pregnant.  She was given Tylenol prior to arrival.  She reports some upper respiratory symptoms.  No significant cough.  No shortness of breath.  No nausea vomiting or diarrhea.  No rash.  No recent travel  Past Medical History:  Diagnosis Date  . Chlamydia 2010  . Depression AGE 60   NO MEDS CURRENTLY  . Gonorrhea 2010  . Ovarian cyst 2013  . Sickle cell trait (HCC)   . Trichomonas vaginalis infection 4/282015  . Urinary tract infection    OCC    Patient Active Problem List   Diagnosis Date Noted  . Trichomonal vaginitis during pregnancy 01/19/2019  . Supervision of high risk pregnancy, antepartum 01/17/2019  . Current pregnancy with history of preterm labor 04/21/2014  . Smoking complicating pregnancy 04/05/2014  . Current smoker 02/03/2013  . Marijuana use 02/03/2013  . History of depression 02/03/2013  . 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 NO PAST SURGERIES       OB History    Gravida  4   Para  2   Term  1   Preterm  1   AB  1   Living  2     SAB  1   TAB      Ectopic      Multiple      Live Births  2            Home Medications    Prior to Admission medications   Medication Sig Start Date End Date Taking? Authorizing Provider  Doxylamine-Pyridoxine ER (BONJESTA) 20-20 MG TBCR Take 1 tablet by mouth at bedtime AND 1 tablet every morning. 01/17/19 06/18/19  Currie Paris, NP  hydroxyprogesterone caproate (MAKENA) 250 mg/mL  OIL injection Inject 1 mL (250 mg total) into the muscle once for 1 dose. 02/14/19 02/14/19  Reva Bores, MD  Prenatal Vit-Fe Fumarate-FA (PREPLUS) 27-1 MG TABS Take 1 tablet by mouth daily. 12/06/18   Calvert Cantor, CNM  Prenatal Vit-Fe Phos-FA-Omega (VITAFOL GUMMIES) 3.33-0.333-34.8 MG CHEW Chew 3 each by mouth daily. 01/17/19   Currie Paris, NP    Family History Family History  Problem Relation Age of Onset  . Asthma Mother   . Fibromyalgia Mother   . Asthma Brother   . Arthritis Maternal Grandfather   . Diabetes Maternal Grandfather   . Hypertension Maternal Grandfather   . Kidney disease Maternal Grandfather   . Stroke Maternal Grandfather   . Birth defects Cousin        CLEFT LIP  . Cancer Cousin   . Cancer Maternal Aunt        MOUTH  . Hypertension Maternal Aunt   . Heart disease Maternal Grandmother   . Hyperlipidemia Maternal Grandmother   . Hypertension Maternal Grandmother   . Diabetes Paternal Grandmother   . Hypertension Maternal Aunt     Social History Social History   Tobacco Use  .  Smoking status: Current Every Day Smoker    Packs/day: 0.25    Types: Cigarettes  . Smokeless tobacco: Never Used  Substance Use Topics  . Alcohol use: Yes    Comment: 1 times a week  . Drug use: Yes    Frequency: 7.0 times per week    Types: Marijuana     Allergies   Tramadol   Review of Systems Review of Systems  All other systems reviewed and are negative.    Physical Exam Updated Vital Signs BP 111/70   Pulse 83   Temp 98.5 F (36.9 C)   Resp 18   LMP 11/03/2018 (Exact Date)   SpO2 100%   Physical Exam Vitals signs and nursing note reviewed.  Constitutional:      General: She is not in acute distress.    Appearance: She is well-developed.  HENT:     Head: Normocephalic and atraumatic.  Neck:     Musculoskeletal: Normal range of motion.  Cardiovascular:     Rate and Rhythm: Normal rate and regular rhythm.     Heart sounds: Normal  heart sounds.  Pulmonary:     Effort: Pulmonary effort is normal.     Breath sounds: Normal breath sounds.  Abdominal:     General: There is no distension.     Palpations: Abdomen is soft.     Tenderness: There is no abdominal tenderness.  Musculoskeletal: Normal range of motion.  Skin:    General: Skin is warm and dry.  Neurological:     Mental Status: She is alert and oriented to person, place, and time.  Psychiatric:        Judgment: Judgment normal.      ED Treatments / Results  Labs (all labs ordered are listed, but only abnormal results are displayed) Labs Reviewed  URINALYSIS, ROUTINE W REFLEX MICROSCOPIC - Abnormal; Notable for the following components:      Result Value   Ketones, ur 20 (*)    All other components within normal limits  INFLUENZA PANEL BY PCR (TYPE A & B)    EKG None  Radiology No results found.  Procedures Procedures (including critical care time)  Medications Ordered in ED Medications - No data to display   Initial Impression / Assessment and Plan / ED Course  I have reviewed the triage vital signs and the nursing notes.  Pertinent labs & imaging results that were available during my care of the patient were reviewed by me and considered in my medical decision making (see chart for details).        Patient is overall well-appearing.  She is influenza negative.  Likely viral upper respiratory tract infection without any substantial recent travel.  Discharged home in good condition.  Standard handwashing and additional hygiene recommendations given.  Instructions to return to the ER for new or worsening symptoms  Final Clinical Impressions(s) / ED Diagnoses   Final diagnoses:  Viral URI    ED Discharge Orders    None       Azalia Bilis, MD 02/18/19 1402

## 2019-02-22 ENCOUNTER — Telehealth: Payer: Self-pay | Admitting: Family Medicine

## 2019-02-22 ENCOUNTER — Encounter: Payer: Self-pay | Admitting: *Deleted

## 2019-02-22 NOTE — Telephone Encounter (Signed)
Attempted to call patient to inform her of the restrictions at the office due to the coronavirus. No answer, left detailed message with the restrictions and office number if needing to reschedule.

## 2019-02-23 ENCOUNTER — Ambulatory Visit (INDEPENDENT_AMBULATORY_CARE_PROVIDER_SITE_OTHER): Payer: Medicaid Other

## 2019-02-23 ENCOUNTER — Other Ambulatory Visit: Payer: Self-pay

## 2019-02-23 VITALS — BP 123/72 | HR 89

## 2019-02-23 DIAGNOSIS — Z8751 Personal history of pre-term labor: Secondary | ICD-10-CM

## 2019-02-23 DIAGNOSIS — O09212 Supervision of pregnancy with history of pre-term labor, second trimester: Secondary | ICD-10-CM

## 2019-02-23 MED ORDER — HYDROXYPROGESTERONE CAPROATE 250 MG/ML IM OIL
250.0000 mg | TOPICAL_OIL | INTRAMUSCULAR | Status: DC
  Administered 2019-02-23: 250 mg via INTRAMUSCULAR

## 2019-02-23 NOTE — Progress Notes (Signed)
I have reviewed the chart and agree with nursing staff's documentation of this patient's encounter.  Dorota Heinrichs, MD 02/23/2019 4:26 PM    

## 2019-02-23 NOTE — Progress Notes (Signed)
Grant Fontana here for 17-P  Injection.  Injection administered without complication. Patient will return in one week for next injection.  Ralene Bathe, RN 02/23/2019  2:28 PM

## 2019-02-28 ENCOUNTER — Encounter: Payer: Self-pay | Admitting: *Deleted

## 2019-03-02 ENCOUNTER — Ambulatory Visit: Payer: Medicaid Other

## 2019-03-10 ENCOUNTER — Telehealth: Payer: Self-pay | Admitting: Family Medicine

## 2019-03-10 ENCOUNTER — Telehealth: Payer: Self-pay | Admitting: General Practice

## 2019-03-10 NOTE — Telephone Encounter (Signed)
Called patient regarding change in appt from April to June. Informed patient her appt on 4/6 to see a doctor has been cancelled and the office is moving towards virtual visits. Patient became very upset and crying stating she knows someone already told her. Patient states she is not doing BP at home or injections at home that's what we are for. Discussed I can empathize with her that this is very scary and new but this is how our office is doing prenatal visits now. Discussed this is a federal and state mandate that we are following to be safe. Discussed we want her to come in to receive her makena injection and be taught how to self administer. Patient states she isn't doing that. Told patient someone in her household can do it for her and we can teach them. Patient states she wouldn't have agreed to these injections if she knew she would have to do them at home. Told patient the injections are recommended given her history. Reassured patient that it is very simple to do the injections herself and it will be in her thigh. Told patient all of our patients are being taught this process and doing it themselves. Patient verbalized understanding and states she may be able to find someone to do it for her. Told patient when she comes by to get her injection/teaching we are also going to give her a BP cuff to check her BP once a week at home and upload it into an app our office uses. Patient states she doesn't know when she can come by for that. Told patient she can come today or tomorrow. Patient states she doesn't know if she can. Told patient she can still come on Monday for those things but she will not be seeing a doctor. Patient verbalized understanding and states I know they already me that. Patient states she will try to come by on Monday. Patient was very upset throughout conversation and crying stating multiple times she's not going to do this from home that's what we are for. Kept reassuring patient.

## 2019-03-10 NOTE — Telephone Encounter (Signed)
Contacted patient to let her know that we would be postponing her appointment due to COVID-19 guidelines and protect her wellbeing. Explained to patient because her and baby are doing well, we are suggesting that her appointment be postpone for 9 weeks and she will have an in office visit at 28 wks to obtain her labs and she the doctor. Patient stated that she is not waiting this long she will not be checking her bps at home or giving herself the 17p injections because this is what the doctors are for. I sympathized with the patient and let her know that I would let her speak to a nurse about this matter and put her on hold. Come back to let patient know that the nurses her tied up but she had already hung up. Let carrie know about the situation and she stated she would give the patient a call.

## 2019-03-11 ENCOUNTER — Telehealth: Payer: Self-pay

## 2019-03-11 NOTE — Telephone Encounter (Signed)
Pt called requesting gender results.

## 2019-03-14 ENCOUNTER — Encounter: Payer: Medicaid Other | Admitting: Family Medicine

## 2019-03-14 NOTE — Telephone Encounter (Signed)
Returned pt's call regarding her gender results.  Informed pt that the results have not yet arrived at the office.  Pt advised to call back in another week or so to follow up. Pt verbalized understanding.

## 2019-03-16 ENCOUNTER — Ambulatory Visit (HOSPITAL_COMMUNITY): Payer: Medicaid Other

## 2019-03-16 ENCOUNTER — Other Ambulatory Visit (HOSPITAL_COMMUNITY): Payer: Medicaid Other

## 2019-03-22 ENCOUNTER — Ambulatory Visit (HOSPITAL_COMMUNITY): Payer: Medicaid Other | Admitting: *Deleted

## 2019-03-22 ENCOUNTER — Ambulatory Visit (HOSPITAL_COMMUNITY)
Admission: RE | Admit: 2019-03-22 | Discharge: 2019-03-22 | Disposition: A | Discharge status: Home / Self Care | Payer: Medicaid Other | Source: Ambulatory Visit | Attending: Nurse Practitioner | Admitting: Nurse Practitioner

## 2019-03-22 ENCOUNTER — Other Ambulatory Visit: Payer: Self-pay

## 2019-03-22 ENCOUNTER — Other Ambulatory Visit: Payer: Self-pay | Admitting: Nurse Practitioner

## 2019-03-22 ENCOUNTER — Other Ambulatory Visit (HOSPITAL_COMMUNITY): Payer: Self-pay | Admitting: *Deleted

## 2019-03-22 ENCOUNTER — Encounter (HOSPITAL_COMMUNITY): Payer: Self-pay

## 2019-03-22 VITALS — Temp 98.2°F

## 2019-03-22 DIAGNOSIS — O09212 Supervision of pregnancy with history of pre-term labor, second trimester: Secondary | ICD-10-CM

## 2019-03-22 DIAGNOSIS — O0990 Supervision of high risk pregnancy, unspecified, unspecified trimester: Secondary | ICD-10-CM | POA: Insufficient documentation

## 2019-03-22 DIAGNOSIS — Z3A19 19 weeks gestation of pregnancy: Secondary | ICD-10-CM | POA: Diagnosis not present

## 2019-03-22 DIAGNOSIS — Z8742 Personal history of other diseases of the female genital tract: Secondary | ICD-10-CM

## 2019-03-22 DIAGNOSIS — O09219 Supervision of pregnancy with history of pre-term labor, unspecified trimester: Secondary | ICD-10-CM | POA: Diagnosis present

## 2019-03-22 DIAGNOSIS — Z363 Encounter for antenatal screening for malformations: Secondary | ICD-10-CM | POA: Diagnosis not present

## 2019-03-22 DIAGNOSIS — O99332 Smoking (tobacco) complicating pregnancy, second trimester: Secondary | ICD-10-CM

## 2019-03-29 ENCOUNTER — Telehealth: Payer: Self-pay | Admitting: Obstetrics and Gynecology

## 2019-03-29 NOTE — Telephone Encounter (Signed)
The patient called in regards to when her next appointment is. Also wanted to know why it was scheduled so far away. Informed the patient of the COVID19 restrictions and the urgency of the visit to limit exposure. The patient also stated she has not received the 17P injection because she is afraid to give it to herself and she also stated the pharmacy did not have any on hand when she went in to pick it up. Sending an urgent message to the nurse.

## 2019-03-30 ENCOUNTER — Ambulatory Visit (HOSPITAL_COMMUNITY): Payer: Medicaid Other

## 2019-03-30 NOTE — Telephone Encounter (Signed)
Called pt about receiving her Makena injection.  Per chart review pt has not had injection since 02/23/19.  Pt reports that she needed to bring a support person with her to help administer the injection and just never scheduled an appt.  We still have pt's Makena so pt stated that she will be able to come tomorrow at 1315 for teaching and she will bring her mother.  I explained to the pt that tomorrow will be the only time that she will be able to bring a visitor with her to her appts until the COVID19 subsides.  I informed pt that she will get instructed on how to administer the IM, SQ injection, given supplies to give injection at home, and make sure that pt is connected with Babys[employer]s to do at home BP.  Looking at pt's chart pt next appt is 05/16/19.  Pt will also need to schedule a sooner appt.  Pt verbalized understanding.

## 2019-03-31 ENCOUNTER — Ambulatory Visit: Payer: Medicaid Other

## 2019-04-12 ENCOUNTER — Ambulatory Visit (HOSPITAL_COMMUNITY): Admission: RE | Admit: 2019-04-12 | Payer: Medicaid Other | Source: Ambulatory Visit

## 2019-04-12 ENCOUNTER — Encounter (HOSPITAL_COMMUNITY): Payer: Self-pay

## 2019-04-12 ENCOUNTER — Ambulatory Visit (HOSPITAL_COMMUNITY): Payer: Medicaid Other | Attending: Obstetrics and Gynecology

## 2019-04-25 ENCOUNTER — Telehealth: Payer: Self-pay | Admitting: Family Medicine

## 2019-04-25 NOTE — Telephone Encounter (Signed)
Attempted to call patient with her appointments on 6/8 @ 8:20 and 9:15. No answer, left detailed message with the appointment information and to come fasting. Patient also informed to wear a face mask and no visitors are allowed. Advised to give the office a call with any questions or concerns.

## 2019-05-13 ENCOUNTER — Other Ambulatory Visit: Payer: Self-pay | Admitting: *Deleted

## 2019-05-13 DIAGNOSIS — O0990 Supervision of high risk pregnancy, unspecified, unspecified trimester: Secondary | ICD-10-CM

## 2019-05-16 ENCOUNTER — Other Ambulatory Visit: Payer: Medicaid Other

## 2019-05-16 ENCOUNTER — Other Ambulatory Visit: Payer: Self-pay

## 2019-05-16 ENCOUNTER — Other Ambulatory Visit (HOSPITAL_COMMUNITY)
Admission: RE | Admit: 2019-05-16 | Discharge: 2019-05-16 | Disposition: A | Discharge status: Home / Self Care | Payer: Medicaid Other | Source: Ambulatory Visit | Attending: Obstetrics and Gynecology | Admitting: Obstetrics and Gynecology

## 2019-05-16 ENCOUNTER — Ambulatory Visit (INDEPENDENT_AMBULATORY_CARE_PROVIDER_SITE_OTHER): Payer: Medicaid Other | Admitting: Obstetrics and Gynecology

## 2019-05-16 VITALS — BP 129/75 | HR 72 | Temp 97.6°F | Wt 118.6 lb

## 2019-05-16 DIAGNOSIS — O0990 Supervision of high risk pregnancy, unspecified, unspecified trimester: Secondary | ICD-10-CM

## 2019-05-16 DIAGNOSIS — O09892 Supervision of other high risk pregnancies, second trimester: Secondary | ICD-10-CM

## 2019-05-16 DIAGNOSIS — Z23 Encounter for immunization: Secondary | ICD-10-CM

## 2019-05-16 DIAGNOSIS — O09899 Supervision of other high risk pregnancies, unspecified trimester: Secondary | ICD-10-CM | POA: Insufficient documentation

## 2019-05-16 DIAGNOSIS — D582 Other hemoglobinopathies: Secondary | ICD-10-CM

## 2019-05-16 DIAGNOSIS — A599 Trichomoniasis, unspecified: Secondary | ICD-10-CM

## 2019-05-16 DIAGNOSIS — Z9119 Patient's noncompliance with other medical treatment and regimen: Secondary | ICD-10-CM

## 2019-05-16 DIAGNOSIS — O23592 Infection of other part of genital tract in pregnancy, second trimester: Secondary | ICD-10-CM

## 2019-05-16 DIAGNOSIS — Z8751 Personal history of pre-term labor: Secondary | ICD-10-CM

## 2019-05-16 DIAGNOSIS — O99332 Smoking (tobacco) complicating pregnancy, second trimester: Secondary | ICD-10-CM

## 2019-05-16 DIAGNOSIS — O99330 Smoking (tobacco) complicating pregnancy, unspecified trimester: Secondary | ICD-10-CM

## 2019-05-16 DIAGNOSIS — Z3A27 27 weeks gestation of pregnancy: Secondary | ICD-10-CM

## 2019-05-16 DIAGNOSIS — F1290 Cannabis use, unspecified, uncomplicated: Secondary | ICD-10-CM

## 2019-05-16 DIAGNOSIS — A5901 Trichomonal vulvovaginitis: Secondary | ICD-10-CM

## 2019-05-16 DIAGNOSIS — O2686 Pruritic urticarial papules and plaques of pregnancy (PUPPP): Secondary | ICD-10-CM

## 2019-05-16 DIAGNOSIS — O99322 Drug use complicating pregnancy, second trimester: Secondary | ICD-10-CM

## 2019-05-16 MED ORDER — LORATADINE 10 MG PO TABS: 10.0000 mg | ORAL_TABLET | Freq: Every day | ORAL | 3 refills | Status: DC

## 2019-05-16 MED ORDER — HYDROCORTISONE 1 % EX OINT: 1.0000 "application " | TOPICAL_OINTMENT | Freq: Two times a day (BID) | CUTANEOUS | 0 refills | Status: DC | PRN

## 2019-05-16 MED ORDER — AMBULATORY NON FORMULARY MEDICATION: 1.0000 | 0 refills | Status: DC

## 2019-05-16 NOTE — Progress Notes (Signed)
Prenatal Visit Note Date: 05/16/2019 Clinic: [facility]  Subjective:  JALEXUS BRETT is a 23 y.o. [id number] at 85w5dbeing seen today for ongoing prenatal care.  She is currently monitored for the following issues for this high-risk pregnancy and has Current smoker; Marijuana use; History of depression; Hemoglobin C trait ([place]; Tobacco use disorder affecting pregnancy, antepartum; History of preterm delivery; Supervision of high risk pregnancy, antepartum; Trichomonal vaginitis during pregnancy; Non-compliant pregnant patient; and Pruritic urticarial papules and plaques of pregnancy on their problem list.  Patient reports ?DFM: patient feels same number of movements as normal but maybe not as strong Itching: all over and skin spots diffusely too. Itching worse underneath breasts  Contractions: Not present. Vag. Bleeding: None.  Movement: (!) Decreased. Denies leaking of fluid.   The following portions of the patient's history were reviewed and updated as appropriate: allergies, current medications, past family history, past medical history, past social history, past surgical history and problem list. Problem list updated.  Objective:   Vitals:   05/16/19 0815  BP: 129/75  Pulse: 72  Temp: 97.6 F (36.4 C)  Weight: 118 lb 9.6 oz (53.8 kg)    Fetal Status: Fetal Heart Rate (bpm): 136 Fundal Height: 28 cm Movement: (!) Decreased     General:  Alert, oriented and cooperative. Patient is in no acute distress.  Skin: Skin is warm and dry. No rash noted.   Cardiovascular: Normal heart rate noted  Respiratory: Normal respiratory effort, no problems with respiration noted  Abdomen: Soft, gravid, appropriate for gestational age. Pain/Pressure: Absent     Pelvic:  Cervical exam deferred        Extremities: Normal range of motion.  Edema: None  Mental Status: Normal mood and affect. Normal behavior. Normal judgment and thought content.   Skin: diffuse flat, black  discolorations (ellipitical shaped, approximately 2-3cm) on trunk, arms, legs  Urinalysis:      Assessment and Plan:  Pregnancy: [id number] 212w5d1. Supervision of high risk pregnancy, antepartum Routine care. D/w pt re: BC more nv - [facility] AMB BABYSCRIPTS SCHEDULE OPTIMIZATION - AMBULATORY NON FORMULARY MEDICATION; 1 Device by Other route once a week. Blood Pressure Cuff Small Monitored Regularly at home ICD 10:Z34.90  Dispense: 1 kit; Refill: 0 - Cervicovaginal ancillary only( [facility])  2. Trichomonal infection toc today - Cervicovaginal ancillary only( [place] 3. Trichomonal vaginitis during pregnancy in second trimester  4. Hemoglobin C trait (HCC) ucx surveillance nv  5. Noncompliant pregnant patient in second trimester Missed mulitiple u/s and robs and 17p shots  6. History of preterm delivery Last 17p in march. No need for further  7. Tobacco use disorder affecting pregnancy, antepartum Cessation advised  8. Marijuana use See above  9. Pruritic urticarial papules and plaques of pregnancy loratidine and hydrocortisone cream sent in   Preterm labor symptoms and general obstetric precautions including but not limited to vaginal bleeding, contractions, leaking of fluid and fetal movement were reviewed in detail with the patient. Please refer to After Visit Summary for other counseling recommendations.  Return in about 2 weeks (around 05/30/2019) for low risk ob in person.   PiAletha HalimMD

## 2019-05-16 NOTE — Progress Notes (Signed)
Pt states she does not have babyscripts or a BP cuff.  Order placed for both and message sent to front office staff to fax prescription for the cuff to the pharmacy. Pt advised that it should take no longer than a week to receive her BP cuff and that, if it does, she needs to contact the clinic.  Pt verbalized understanding.

## 2019-05-16 NOTE — Patient Instructions (Signed)
AREA PEDIATRIC/FAMILY PRACTICE PHYSICIANS  Central/Southeast [place] [phone]) . [facility] Davy Pique, MD; Gwendlyn Deutscher, MD; Walker Kehr, MD; Andria Frames, MD; McDiarmid, MD; Dutch Quint, MD; Nori Riis, MD; Mingo Amber, [place]., [place], [place] [address] o [phone] o Mon-Fri 8:30-12:30, 1:30-5:00 o Providers come to see babies at [facility] o Accepting Medicaid . [place] [facility] [place] providers who accept newborns: Dorthy Cooler, MD; Orland Mustard, MD; Stephanie Acre, MD o [place], Rena L[place], [place] [address] o [phone] o Mon-Fri 8:00-5:30 o Babies seen by providers at [facility] o Does NOT accept Medicaid o Please call early in hospitalization for appointment (limited availability)  . Mustard [place], MD o [address], [place], [place] [address] o [phone] o Mon, Tue, Thur, Fri 8:30-5:00, Wed 10:00-7:00 (closed 1-2pm) o Babies seen by [facility] providers o Accepting Medicaid . [place], MD o [place], [place], [place] [address] o [phone] o Mon-Fri 8:30-5:00, Sat 8:30-12:00 o Provider comes to see babies at [place] Medicaid o Must have been referred from current patients or contacted office prior to delivery . [place] [facility] ([place] for [facility]) Franne Forts, MD; Tamera Punt, MD; Doneen Poisson, MD; Fatima Sanger, MD; Wynetta Emery, MD; Jess Barters, MD; Tami Ribas, MD; Herbert Moors, MD; Derrell Lolling, MD; Dorothyann Peng, MD; Lucious Groves, NP; Baldo Ash, NP o [address], [place], [place] [address] o [phone] o Mon, Tue, Thur, Fri 8:30-5:30, Wed 9:30-5:30, Sat 8:30-12:30 o Babies seen by [facility] providers o Accepting Medicaid o Only accepting infants of first-time parents or siblings of current patients [facility] discharge coordinator will make follow-up appointment . Baltazar Najjar o [place] [address],  [place], [place]  [address] o [phone]   Fax - [phone] . [facility] o [address], [place], [place]  [address] o Phone - [phone]   Fax [phone] . [place], [place], [place], [place]  [address] o [phone]  East/Northeast [place] [phone]) . [facility] Reginal Lutes, MD; Jacklynn Ganong, MD; Torrie Mayers, MD; MD; Rosana Hoes, MD; Servando Salina, MD; Rose Fillers, MD; Rex Kras, MD; Corinna Capra, MD; Volney American, MD; Trilby Drummer, MD; Janann Colonel, MD; Jimmye Norman, [place], [place], [place] [address] o [phone] o Mon-Fri 8:30-5:00 (extended evenings Mon-Thur as needed), Sat-Sun 10:00-1:00 o Providers come to see babies at [place] Medicaid for families of first-time babies and families with all children in the household age 72 and under. Must register with office prior to making appointment (M-F only). . [place], NP; Tomi Bamberger, MD; Redmond School, MD; [place], [place] [address]., [place], [place] [address] o [phone] o Mon-Fri 8:00-5:00 o Babies seen by providers at [facility] o Does NOT accept Medicaid/Commercial Insurance Only . [facility])  Marnee Guarneri, MD; Drema Dallas, MD; Montine Circle, MD; Vilma Prader, MD; Vanita Panda, MD; Alfonso Ramus, MD; Ruthann Cancer, MD; Roxanne Mins, MD; Rosalva Ferron, MD; Polly Cobia, MD o [address] [place], [place] [address] o [phone] o Mon-Fri 8:30-5:30, Sat (Oct.-Mar.) 9:00-1:00 o Babies seen by providers at [place] [phone]) . [facility] Peers, MD; Suzan Slick, MD o [address], [place], [place] [address] o [phone] o Mon-Fri 8:30-5:00, Sat 8:30-12:00 o Providers come to see babies at [facility] o Does NOT accept Medicaid . [facility] Ricci Barker, PA; Mannie Stabile, MD; [place], [place]; Nancy Fetter, MD; Moreen Fowler, [place],  [place],  [place] [address] o [phone] o Mon-Fri 8:00-5:00 o Babies seen by providers at [facility] o Does NOT accept Medicaid o Only accepting babies of parents who are patients o Please call early in hospitalization for appointment (limited availability) . [facility] Pediatricians Blanca Friend, MD; Sharlene Motts, MD; Rod Can, MD; Warner Mccreedy, NP; Sabra Heck, MD; Ermalinda Memos, MD; Sharlett Iles, NP; Aurther Loft, MD; Jerrye Beavers, MD; Marcello Moores, MD; Berline Lopes, MD; Charolette Forward, MD o [address] [place], [place], [place] [address] o [phone] o Mon-Fri 8:00-5:00, Sat 9:00-12:00 o Providers come to see babies at [facility] o Does NOT accept [facility] [phone]) . [place] [facility] [place] providers accepting new patients: Dayna Ramus, NP; [place], [place], [place], [place] [address] o [phone] o Mon-Fri 8:00-5:00 o Babies seen by providers at [facility] o Does NOT accept Medicaid o Only accepting babies of parents who are patients o Please call early in hospitalization for appointment (limited availability) . [facility] Oswaldo Conroy, MD; Sheran Lawless, MD o [address]., [place], [place] [address] o [phone] (press 1 to schedule appointment) o Mon-Fri 8:00-5:00 o Providers come to see babies at [facility] o Does NOT accept Medicaid . [facility] Jodi Mourning, MD o [address], [place], [place] [address] o [phone] o Mon-Fri 8:30-5:00 (lunch 12:30-1:00), extended hours by appointment only Wed 5:00-6:30 o Babies seen by [facility] providers o Accepting Medicaid . [place] at Evalyn Casco, MD; [place], MD; Ethlyn Gallery, MD o [place], [place], [place] [address] o [phone] o Mon-Fri 8:00-5:00 o Babies seen by [facility] providers o Does NOT accept Medicaid . Therapist, music at [place], MD; Yong Channel, MD; [place], [place] [address] [address] [place], [place]  [address] o [phone] o Mon-Fri 8:00-5:00 o Babies seen by [facility] providers o Does NOT accept Medicaid . [place], [place]; [place], [place]; [place], NP; Albertina Parr, MD; Frederic Jericho, MD; Ronney Lion, MD; Carlos Levering, NP; Jerelene Redden, NP; Tomasita Crumble, NP; Ronelle Nigh, NP; Corinna Lines, MD; [place], MD o [place]., [place], [place] [address] o [phone] o Mon-Fri 8:30-5:00, Sat 10:00-1:00 o Providers come to see babies at [facility] o Does NOT accept Medicaid o Free prenatal information session Tuesdays at 4:45pm . [facility] Porfirio Oar, MD; [place], [place]; [place], [place]; [place], [place]., [place] [address] o [phone] o Mon-Fri 7:30-5:30 o Babies seen by [facility] providers . [facility] o [address] [place], [place], [place]  [address] o [phone]   Fax - [phone]  [place] [phone] & [phone]) . [place], MD o [address] [place], [place] [address] o [phone] o Mon-Thur 8:00-6:00 o Providers come to see babies at [place] Medicaid . [place], NP; Melford Aase, MD; [place], [place]; [place], [place]., [place], [place] [address] o [phone] o Mon-Thur 7:30-7:30, Fri 7:30-4:30 o Babies seen by [facility] providers o Accepting Medicaid . [facility] Nyra Jabs, MD; Cristino Martes, NP; Gertie Baron, MD o [address], [place], [place] [address] o [phone] o Mon-Fri 8:30-5:00, Sat 8:30-12:00 o Providers come to see babies at [place] Medicaid o Must have "Meet & Greet" appointment at office prior to delivery . [place] ([place]) o [place],  MD; Juleen China, MD; Clydene Laming, [place] [address], [place], [place] [address] o [phone] o Mon-Wed 8:00-6:00, Thur-Fri 8:00-5:00, Sat 9:00-12:00 o Providers come to  see babies at [facility] o Does NOT accept Medicaid o Only accepting siblings of current patients . [facility] [place], [place], [place], [place]  [address] o [phone]   Fax [phone] . [place] at [place] [address], [place], [place]  [address] o [phone]   Fax - [place] Gorman [phone] & [phone]) . Therapist, music at [place], DO; [place], [place]., [place], [place] [address] o [phone] o Mon-Fri 7:00-5:00 o Babies seen by [facility] providers o Does NOT accept Medicaid . [place], MD; [place], [place]; [place], [place], [place], [place] [address] o [phone] o Mon-Fri 8:00-5:00 o Babies seen by [facility] providers o Accepting Medicaid . [place], MD; [place], [place]; [place], NP; [place], [place], [place], [place] [address] o [phone] o Mon-Fri 8:00-5:00 o Babies seen by providers at [place] [phone]) . [facility] [place], [place] o [address] [place], [place] [address] o [phone] o Mon-Fri 8:00-5:00 o Babies seen by [facility] providers o Does NOT accept Medicaid o Limited availability, please call early in hospitalization to schedule follow-up . [facility] Leilani Merl, PA; Maisie Fus, MD; [place], MD; [place], [place]; Jeannine Kitten, MD; [place], [place] [facility] [address], [place], [place] [address] o [phone] o Mon-Fri 8:30-5:00, Sat 9:00-12:00 o Babies seen by providers at [facility] o Accepting Medicaid o Please register online then schedule online or call office o [email or web address] . [place] ([place] at  [place]) Kristian Covey, NP; Dwyane Dee, MD; Leonidas Romberg, PA o [address] [place], [place], [place] [address] o [phone] o Mon-Fri 8:00-5:00 o Babies seen by providers at [place] o Accepting Medicaid . [place] ([facility] [employer]) Dairl Ponder, MD; Rayvon Char, NP; Melina Modena, MD o [address] [place], [place], [place] [address] o [phone] o Mon-Fri 8:00-5:30, Sat&Sun by appointment (phones open at 8:30) o Babies seen by [facility] providers o Accepting Medicaid o Must be a first-time baby or sibling of current patient . [place], [address], [place], [place]  [address] o [phone]   Fax - [phone]  [place] [phone] & [phone]) . [place], [place]; [place], [place]; Benjamine Mola, MD; [place], [place]; Harrell Lark, MD o [address] [place], [place] [address] o [phone] o Mon-Thur 8:00-7:00, Fri 8:00-5:00, Sat 8:00-12:00, Sun 9:00-12:00 o Babies seen by [facility] providers o Accepting Medicaid . [facility] at [facility], MD; Ruthann Cancer, MD; [facility], MD o [address], [place], [place] [address] o [phone] o Mon-Thur 8:00-5:00 o Babies seen by providers at [facility] o Accepting Medicaid . [facility] at [place], MD; Coe-Goins, MD; Amedeo Plenty, MD; Bobby Rumpf, MD; List, MD; Lavonia Drafts, MD; Ruthann Cancer, MD; Selinda Eon, MD; Audie Box, MD; Jim Like, MD; Christie Nottingham, MD; Hubbard Hartshorn, MD; Modena Nunnery, MD o [address]., [place], [place]  [address] o [phone] o Mon-Fri 8:00-5:30, Sat (Oct.-Mar.) 9:00-1:00 o Babies seen by providers at [facility] o Accepting Medicaid o Must fill out new patient packet, available online at [email or web address] . [place] ([facility]) Barnabas Lister, NP; Kenton Kingfisher, NP; Claiborne Billings, NP; Rolla Plate, MD;  [place], [place]; Carola Rhine, MD; Tyron Russell, MD; Delia Chimes, NP o [address], [place], [place] [address] o [phone] o Mon-Thur 8:00-5:30, Fri 8:00-5:00 o Babies seen by providers at [place] [phone]) . [place], [place]; [place], MD; Dennard Schaumann, MD; [place], [place] o [address] [place], [place] [address] o [phone] o Mon-Fri 8:00-5:00 o Babies seen by providers at [place] [phone]) . [place] at [place], [place]; Olen Pel, MD; [place], [place], [place], [place] [address] o [phone] o Mon-Fri 8:00-5:00 o Babies seen by providers at [facility] o Does NOT accept Medicaid o Limited appointment availability, please call early in hospitalization  . Therapist, music at [place], [place]; [place], [address] [place], [place] [address] o [phone] o Mon-Fri 8:00-5:00 o Babies seen by [facility] providers o Does NOT accept Medicaid . [facility] Su Grand, MD; Guy Sandifer, MD; [place], [place]; [place], [place] [address], [place], [place] [address] o [phone] o Mon-Fri 8:00-5:00 o After hours clinic [facility])[address] [place], [place] [address]) [phone] Mon-Fri 5:00-8:00, Sat 12:00-6:00, Sun 10:00-4:00 o Babies seen by [facility] providers o Accepting Medicaid . [place] at [facility] o [address], [place], [place]  [address] o [phone]   Fax - [phone]  [place] [phone]) . Therapist, music at [facility], MD o [address] [place], [place], [place] [address] o [phone] o Mon-Fri 8:00-5:00 o Babies seen by [facility] providers o Does NOT accept Medicaid . [employer] ([place] [facility] [place]) Bing Neighbors, MD o [address], [place],   [address] o [phone] o Mon-Thur 8:00-7:00, Fri 8:00-5:00, Sat 8:00-12:00 o Babies seen by providers at [facility] o Accepting Medicaid - but does not have vaccinations in office (must be received elsewhere) o Limited availability, please call early in hospitalization  [place] ([address]) . [place], MD o [address], [place] [address] o [phone]  Fax [phone]  Childbirth Education Options: [facility] Department Classes:  Childbirth education classes can help you get ready for a positive parenting experience. You can also meet other expectant parents and get free stuff for your baby. Each class runs for five weeks on the same night and costs $45 for the mother-to-be and her support person. Medicaid covers the cost if you are eligible. Call [phone] to register. [facility] Childbirth Education:  [phone] or [phone] or [email or web address]  Baby & Me Class: Discuss newborn & infant parenting and family adjustment issues with other new mothers in a relaxed environment. Each week brings a new speaker or baby-centered activity. We encourage new mothers to join [place] every Thursday at 11:00am. Babies birth until crawling. No registration or fee. Daddy [employer]: This course offers Dads-to-be the tools and knowledge needed to feel confident on their journey to becoming new fathers. Experienced dads, who have been trained as coaches, teach dads-to-be how to  hold, comfort, diaper, swaddle and play with their infant while being able to support the new mom as well. A class for men taught by men. $25/dad Big Brother/Big Sister: Let your children share in the joy of a new brother or sister in this special class designed just for them. Class includes discussion about how families care for babies: swaddling, holding, diapering, safety as well as how they can be helpful in their new role. This class is designed for  children ages 20 to 95, but any age is welcome. Please register each child individually. $5/child  Mom Talk: This mom-led group offers support and connection to mothers as they journey through the adjustments and struggles of that sometimes overwhelming first year after the birth of a child. Tuesdays at 10:00am and Thursdays at 6:00pm. Babies welcome. No registration or fee. Breastfeeding Support Group: This group is a mother-to-mother support circle where moms have the opportunity to share their breastfeeding experiences. A Lactation Consultant is present for questions and concerns. Meets each Tuesday at 11:00am. No fee or registration. Breastfeeding Your Baby: Learn what to expect in the first days of breastfeeding your newborn.  This class will help you feel more confident with the skills needed to begin your breastfeeding experience. Many new mothers are concerned about breastfeeding after leaving the hospital. This class will also address the most common fears and challenges about breastfeeding during the first few weeks, months and beyond. (call for fee) Comfort Techniques and Tour: This 2 hour interactive class will provide you the opportunity to learn & practice hands-on techniques that can help relieve some of the discomfort of labor and encourage your baby to rotate toward the best position for birth. You and your partner will be able to try a variety of labor positions with birth balls and rebozos as well as practice breathing, relaxation, and visualization techniques. A tour of the [facility] is included with this class. $20 per registrant and support person Childbirth Class- Weekend Option: This class is a Weekend version of our Birth & Baby series. It is designed for parents who have a difficult time fitting several weeks of classes into their schedule. It covers the care of your newborn and the basics of labor and childbirth. It also includes a [place]  [facility] and lunch. The class is held two consecutive days: beginning on Friday evening from 6:30 - 8:30 p.m. and the next day, Saturday from 9 a.m. - 4 p.m. (call for fee) Doren Custard Class: Interested in a waterbirth?  This informational class will help you discover whether waterbirth is the right fit for you. Education about waterbirth itself, supplies you would need and how to assemble your support team is what you can expect from this class. Some obstetrical practices require this class in order to pursue a waterbirth. (Not all obstetrical practices offer waterbirth-check with your healthcare provider.) Register only the expectant mom, but you are encouraged to bring your partner to class! Required if planning waterbirth, no fee. Infant/Child CPR: Parents, grandparents, babysitters, and friends learn Cardio-Pulmonary Resuscitation skills for infants and children. You will also learn how to treat both conscious and unconscious choking in infants and children. This Family & Friends program does not offer certification. Register each participant individually to ensure that enough mannequins are available. (Call for fee) Grandparent Love: Expecting a grandbaby? This class is for you! Learn about the latest infant care and safety recommendations and ways to support your own child as  he or she transitions into the parenting role. Taught by Registered Nurses who are childbirth instructors, but most importantly...they are grandmothers too! $10/person. Childbirth Class- Natural Childbirth: This series of 5 weekly classes is for expectant parents who want to learn and practice natural methods of coping with the process of labor and childbirth. Relaxation, breathing, massage, visualization, role of the partner, and helpful positioning are highlighted. Participants learn how to be confident in their body's ability to give birth. This class will empower and help parents make informed decisions about their own  care. Includes discussion that will help new parents transition into the immediate postpartum period. [facility] is included. We suggest taking this class between 25-32 weeks, but it's only a recommendation. $75 per registrant and one support person or $30 Medicaid. Childbirth Class- 3 week Series: This option of 3 weekly classes helps you and your labor partner prepare for childbirth. Newborn care, labor & birth, cesarean birth, pain management, and comfort techniques are discussed and a [place] [facility] is included. The class meets at the same time, on the same day of the week for 3 consecutive weeks beginning with the starting date you choose. $60 for registrant and one support person.  Marvelous Multiples: Expecting twins, triplets, or more? This class covers the differences in labor, birth, parenting, and breastfeeding issues that face multiples' parents. NICU tour is included. Led by a Certified Childbirth Educator who is the mother of twins. No fee. Caring for Baby: This class is for expectant and adoptive parents who want to learn and practice the most up-to-date newborn care for their babies. Focus is on birth through the first six weeks of life. Topics include feeding, bathing, diapering, crying, umbilical cord care, circumcision care and safe sleep. Parents learn to recognize symptoms of illness and when to call the pediatrician. Register only the mom-to-be and your partner or support person can plan to come with you! $10 per registrant and support person Childbirth Class- online option: This online class offers you the freedom to complete a Birth and Baby series in the comfort of your own home. The flexibility of this option allows you to review sections at your own pace, at times convenient to you and your support people. It includes additional video information, animations, quizzes, and extended activities. Get organized with helpful  eClass tools, checklists, and trackers. Once you register online for the class, you will receive an email within a few days to accept the invitation and begin the class when the time is right for you. The content will be available to you for 60 days. $60 for 60 days of online access for you and your support people.  Local Doulas: Natural Baby Doulas [email or web address] Tel: [phone] [email or web address] [employer] [phone] [email or web address] The Labor Hassell Halim  (also do waterbirth tub rental) [phone] [email or web address] Triad Birth Doula [phone] [email or web address].fi Sacred Rhythms  [phone] https://[email or web address]/ [employer] (PADA) [email or web address]/ [place] Birth and Baby  [email or web address] Considering Waterbirth? Guide for patients at Center for [employer]  Why consider waterbirth?  . Gentle birth for babies . Less pain medicine used in labor . May allow for passive descent/less pushing . May reduce perineal tears  . More mobility and instinctive maternal position changes . Increased maternal relaxation . Reduced blood pressure in labor  Is waterbirth safe? What are the risks of infection, drowning or other complications?  . Infection: o Very low risk (3.7 % for  vs 4.8% for bed) o 7 in 8000 waterbirths with documented infection o Poorly cleaned equipment most common cause o Slightly lower group B strep transmission rate  . Drowning o Maternal:  - Very low risk   - Related to seizures or fainting o Newborn:  - Very low risk. No evidence of increased risk of respiratory problems in multiple large studies - Physiological protection from breathing under water - Avoid underwater birth if there are any fetal  complications - Once baby's head is out of the water, keep it out.  . Birth complication o Some reports of cord trauma, but risk decreased by bringing baby to surface gradually o No evidence of increased risk of shoulder dystocia. Mothers can usually change positions faster in water than in a bed, possibly aiding the maneuvers to free the shoulder.   You must attend a Waterbirth class at [facility]  3rd Wednesday of every month from 7-9pm  Free  Register by calling [phone] or online at [email or web address]  Bring us the certificate from the class to your prenatal appointment  Meet with a midwife at 36 weeks to see if you can still plan a waterbirth and to sign the consent.   Purchase or rent the following supplies:   Water Birth Pool (Birth Pool in a Box or [place] for instance)  (Tubs start ~$125)  Single-use disposable tub liner designed for your brand of tub  New garden hose labeled "lead-free", "suitable for drinking water",  Electric drain pump to remove water (We recommend 792 gallon per hour or greater pump.)   Separate garden hose to remove the dirty water  Fish net  Bathing suit top (optional)  Long-handled mirror (optional)  Places to purchase or rent supplies  Yourwaterbirth.com for tub purchases and supplies  Waterbirthsolutions.com for tub purchases and supplies  [employer] (www.thelaborladies.com) $275 for tub rental/set-up & take down/kit   [place] [employer] (http://www.padanc.org/MeetUs.htm) Information regarding doulas (labor support) who provide pool rentals  Our practice has a Birth Pool in a Box tub at the hospital that you may borrow on a first-come-first-served basis. It is your responsibility to to set up, clean and break down the tub. We cannot guarantee the availability of this tub in advance. You are responsible for bringing all accessories listed above. If you do not have all necessary supplies you cannot  have a waterbirth.    Things that would prevent you from having a waterbirth:  Premature, <37wks  Previous cesarean birth  Presence of thick meconium-stained fluid  Multiple gestation (Twins, triplets, etc.)  Uncontrolled diabetes or gestational diabetes requiring medication  Hypertension requiring medication or diagnosis of pre-eclampsia  Heavy vaginal bleeding  Non-reassuring fetal heart rate  Active infection (MRSA, etc.). Group B Strep is NOT a contraindication for  waterbirth.  If your labor has to be induced and induction method requires continuous  monitoring of the baby's heart rate  Other risks/issues identified by your obstetrical provider  Please remember that birth is unpredictable. Under certain unforeseeable circumstances your provider may advise against giving birth in the tub. These decisions will be made on a case-by-case basis and with the safety of you and your baby as our highest priority.     

## 2019-05-16 NOTE — Addendum Note (Signed)
Addended by: Dolores Hoose on: 05/16/2019 09:46 AM   Modules accepted: Orders

## 2019-05-17 ENCOUNTER — Other Ambulatory Visit: Payer: Self-pay | Admitting: *Deleted

## 2019-05-17 DIAGNOSIS — O0990 Supervision of high risk pregnancy, unspecified, unspecified trimester: Secondary | ICD-10-CM

## 2019-05-17 LAB — CBC
Hematocrit: 31.4 % — ABNORMAL LOW (ref 34.0–46.6)
Hemoglobin: 10.9 g/dL — ABNORMAL LOW (ref 11.1–15.9)
MCH: 27.4 pg (ref 26.6–33.0)
MCHC: 34.7 g/dL (ref 31.5–35.7)
MCV: 79 fL (ref 79–97)
Platelets: 247 10*3/uL (ref 150–450)
RBC: 3.98 x10E6/uL (ref 3.77–5.28)
RDW: 13.2 % (ref 11.7–15.4)
WBC: 8.6 10*3/uL (ref 3.4–10.8)

## 2019-05-17 LAB — RPR: RPR Ser Ql: NONREACTIVE

## 2019-05-17 LAB — GLUCOSE TOLERANCE, 2 HOURS W/ 1HR
Glucose, 1 hour: 87 mg/dL (ref 65–179)
Glucose, 2 hour: 64 mg/dL — ABNORMAL LOW (ref 65–152)
Glucose, Fasting: 71 mg/dL (ref 65–91)

## 2019-05-17 LAB — CERVICOVAGINAL ANCILLARY ONLY
Chlamydia: NEGATIVE
Neisseria Gonorrhea: NEGATIVE
Trichomonas: NEGATIVE

## 2019-05-17 LAB — HIV ANTIBODY (ROUTINE TESTING W REFLEX): HIV Screen 4th Generation wRfx: NONREACTIVE

## 2019-05-17 NOTE — Progress Notes (Signed)
Per Dr. Thomes Dinning request, Ferritin level ordered for pt.  [facility] contacted and lab added 05/17/19 @ 0921 by [employer].

## 2019-05-19 LAB — FERRITIN: Ferritin: 11 ng/mL — ABNORMAL LOW (ref 15–150)

## 2019-05-19 LAB — SPECIMEN STATUS REPORT

## 2019-05-25 ENCOUNTER — Telehealth (INDEPENDENT_AMBULATORY_CARE_PROVIDER_SITE_OTHER): Payer: Medicaid Other | Admitting: *Deleted

## 2019-05-25 DIAGNOSIS — O0990 Supervision of high risk pregnancy, unspecified, unspecified trimester: Secondary | ICD-10-CM

## 2019-05-25 NOTE — Telephone Encounter (Signed)
Called pt regarding babyscripts.  Per review, noted that pt has never logged a blood pressure into the app.  Pt states she has not yet received a BP cuff or a call from the pharmacy to verify her information.  Will contact [facility].

## 2019-05-30 ENCOUNTER — Encounter: Payer: Self-pay | Admitting: Obstetrics and Gynecology

## 2019-05-30 ENCOUNTER — Other Ambulatory Visit: Payer: Self-pay

## 2019-05-30 ENCOUNTER — Ambulatory Visit (INDEPENDENT_AMBULATORY_CARE_PROVIDER_SITE_OTHER): Payer: Medicaid Other | Admitting: Obstetrics and Gynecology

## 2019-05-30 VITALS — BP 116/73 | HR 90 | Temp 98.4°F | Wt 117.1 lb

## 2019-05-30 DIAGNOSIS — O0993 Supervision of high risk pregnancy, unspecified, third trimester: Secondary | ICD-10-CM

## 2019-05-30 DIAGNOSIS — O0990 Supervision of high risk pregnancy, unspecified, unspecified trimester: Secondary | ICD-10-CM

## 2019-05-30 DIAGNOSIS — Z3A29 29 weeks gestation of pregnancy: Secondary | ICD-10-CM

## 2019-05-30 MED ORDER — DOCUSATE SODIUM 100 MG PO CAPS: 100.0000 mg | ORAL_CAPSULE | Freq: Two times a day (BID) | ORAL | 1 refills | Status: DC | PRN

## 2019-05-30 MED ORDER — FERROUS FUMARATE 150 MG PO TABS: 1.0000 | ORAL_TABLET | Freq: Every day | ORAL | 1 refills | Status: DC

## 2019-05-30 NOTE — Progress Notes (Signed)
Pt states has been having a rash a rash on arms for the most recent four days.

## 2019-05-30 NOTE — Progress Notes (Addendum)
Prenatal Visit Note Date: 05/30/2019 Clinic: [facility]  Subjective:  RAMON BRANT is a 23 y.o. [id number] at [redacted]w[redacted]d being seen today for ongoing prenatal care.  She is currently monitored for the following issues for this high-risk pregnancy and has Current smoker; Marijuana use; History of depression; Hemoglobin C trait ([place]); Tobacco use disorder affecting pregnancy, antepartum; History of preterm delivery; Supervision of high risk pregnancy, antepartum; Trichomonal vaginitis during pregnancy; Non-compliant pregnant patient; and Pruritic urticarial papules and plaques of pregnancy on their problem list.  Patient reports arm rash x 2 days.   Contractions: Not present. Vag. Bleeding: None.  Movement: Present. Denies leaking of fluid.   The following portions of the patient's history were reviewed and updated as appropriate: allergies, current medications, past family history, past medical history, past social history, past surgical history and problem list. Problem list updated.  Objective:   Vitals:   05/30/19 1031  BP: 116/73  Pulse: 90  Temp: 98.4 F (36.9 C)  Weight: 117 lb 1.6 oz (53.1 kg)    Fetal Status: Fetal Heart Rate (bpm): 150 Fundal Height: 30 cm Movement: Present     General:  Alert, oriented and cooperative. Patient is in no acute distress.  Skin: Skin is warm and dry. No rash noted.   Cardiovascular: Normal heart rate noted  Respiratory: Normal respiratory effort, no problems with respiration noted  Abdomen: Soft, gravid, appropriate for gestational age. Pain/Pressure: Absent     Pelvic:  Cervical exam deferred        Extremities: Normal range of motion.  Edema: None  Mental Status: Normal mood and affect. Normal behavior. Normal judgment and thought content.   Urinalysis:      Assessment and Plan:  Pregnancy: [id number] at [redacted]w[redacted]d  Routine care. qday iron for anemia. Diffuse pimple like rash on arms and hands, somewhat itchy. Pt is living in  new residence. Pt already taking claritin. Recommend steroid cream to arms as needed. It looks like an allergic reaction, similar to with pollen/dander Pt prefers in person visits  Preterm labor symptoms and general obstetric precautions including but not limited to vaginal bleeding, contractions, leaking of fluid and fetal movement were reviewed in detail with the patient. Please refer to After Visit Summary for other counseling recommendations.  Return in about 3 weeks (around 06/20/2019) for hrob, virtual.   Aletha Halim, MD

## 2019-06-22 ENCOUNTER — Ambulatory Visit (INDEPENDENT_AMBULATORY_CARE_PROVIDER_SITE_OTHER): Payer: Medicaid Other | Admitting: Obstetrics and Gynecology

## 2019-06-22 ENCOUNTER — Encounter: Payer: Self-pay | Admitting: Obstetrics and Gynecology

## 2019-06-22 ENCOUNTER — Other Ambulatory Visit: Payer: Self-pay

## 2019-06-22 VITALS — BP 117/74 | HR 89 | Wt 118.0 lb

## 2019-06-22 DIAGNOSIS — O99330 Smoking (tobacco) complicating pregnancy, unspecified trimester: Secondary | ICD-10-CM

## 2019-06-22 DIAGNOSIS — D573 Sickle-cell trait: Secondary | ICD-10-CM

## 2019-06-22 DIAGNOSIS — Z8751 Personal history of pre-term labor: Secondary | ICD-10-CM

## 2019-06-22 DIAGNOSIS — Z3A33 33 weeks gestation of pregnancy: Secondary | ICD-10-CM

## 2019-06-22 DIAGNOSIS — N898 Other specified noninflammatory disorders of vagina: Secondary | ICD-10-CM

## 2019-06-22 DIAGNOSIS — O0990 Supervision of high risk pregnancy, unspecified, unspecified trimester: Secondary | ICD-10-CM

## 2019-06-22 DIAGNOSIS — O2686 Pruritic urticarial papules and plaques of pregnancy (PUPPP): Secondary | ICD-10-CM

## 2019-06-22 DIAGNOSIS — O99013 Anemia complicating pregnancy, third trimester: Secondary | ICD-10-CM

## 2019-06-22 MED ORDER — FERROUS SULFATE 324 (65 FE) MG PO TBEC: 1.0000 | DELAYED_RELEASE_TABLET | Freq: Every day | ORAL | 2 refills | Status: DC

## 2019-06-22 NOTE — Patient Instructions (Signed)
Use the following websites (and others) to help learn more about your contraception options and find the method that is right for you!  - The Centers for Disease Control (CDC) website: TransferLive.sehttps://www.cdc.gov/reproductivehealth/contraception/index.htm  - [facility] website: https://www.plannedparenthood.org/learn/birth-control  - Bedsider.org: https://www.bedsider.org/methods    The Nexplanon and the IUD can be placed while in hospital after delivery.    Contraception Choices Contraception, also called birth control, refers to methods or devices that prevent pregnancy. Hormonal methods Contraceptive implant A contraceptive implant is a thin, plastic tube that contains a hormone. It is inserted into the upper part of the arm. It can remain in place for up to 3 years. Progestin-only injections Progestin-only injections are injections of progestin, a synthetic form of the hormone progesterone. They are given every 3 months by a health care provider. Birth control pills Birth control pills are pills that contain hormones that prevent pregnancy. They must be taken once a day, preferably at the same time each day. Birth control patch The birth control patch contains hormones that prevent pregnancy. It is placed on the skin and must be changed once a week for three weeks and removed on the fourth week. A prescription is needed to use this method of contraception. Vaginal ring A vaginal ring contains hormones that prevent pregnancy. It is placed in the vagina for three weeks and removed on the fourth week. After that, the process is repeated with a new ring. A prescription is needed to use this method of contraception. Emergency contraceptive Emergency contraceptives prevent pregnancy after unprotected sex. They come in pill form and can be taken up to 5 days after sex. They work best the sooner they are taken after having sex. Most emergency contraceptives are available without a  prescription. This method should not be used as your only form of birth control. Barrier methods Female condom A female condom is a thin sheath that is worn over the penis during sex. Condoms keep sperm from going inside a woman's body. They can be used with a spermicide to increase their effectiveness. They should be disposed after a single use. Female condom A female condom is a soft, loose-fitting sheath that is put into the vagina before sex. The condom keeps sperm from going inside a woman's body. They should be disposed after a single use.  Intrauterine contraception Intrauterine device (IUD) An IUD is a T-shaped device that is put in a woman's uterus. There are two types:  Hormone IUD.This type contains progestin, a synthetic form of the hormone progesterone. This type can stay in place for 3-5 years.  Copper IUD.This type is wrapped in copper wire. It can stay in place for 10 years.  Permanent methods of contraception Female tubal ligation In this method, a woman's fallopian tubes are sealed, tied, or blocked during surgery to prevent eggs from traveling to the uterus.  Female sterilization This is a procedure to tie off the tubes that carry sperm (vasectomy). After the procedure, the man can still ejaculate fluid (semen).  Summary  Contraception, also called birth control, means methods or devices that prevent pregnancy.  Hormonal methods of contraception include implants, injections, pills, patches, vaginal rings, and emergency contraceptives.  Barrier methods of contraception can include female condoms, female condoms, diaphragms, cervical caps, sponges, and spermicides.  There are two types of IUDs (intrauterine devices). An IUD can be put in a woman's uterus to prevent pregnancy for 3-5 years.  Permanent sterilization can be done through a procedure for males, females, or both. This  information is not intended to replace advice given to you by your health care provider. Make  sure you discuss any questions you have with your health care provider. Document Released: 11/24/2005 Document Revised: 12/27/2016 Document Reviewed: 12/27/2016 Elsevier Interactive Patient Education  2018 [employer].

## 2019-06-22 NOTE — Progress Notes (Signed)
Patient reports white thick discharge with vaginal irritation and itching x 4 days.

## 2019-06-22 NOTE — Progress Notes (Signed)
   PRENATAL VISIT NOTE  Subjective:  Julia Roman is a 23 y.o. [id number] at [redacted]w[redacted]d being seen today for ongoing prenatal care.  She is currently monitored for the following issues for this high-risk pregnancy and has Current smoker; Marijuana use; History of depression; Hemoglobin C trait ([place]); Tobacco use disorder affecting pregnancy, antepartum; History of preterm delivery; Supervision of high risk pregnancy, antepartum; Trichomonal vaginitis during pregnancy; Non-compliant pregnant patient; Pruritic urticarial papules and plaques of pregnancy; and Sickle cell trait ([facility]) on their problem list.  Patient reports thick white vaginal discharge and irritation. Skin rash is improved. Starting to have Braxton-Hicks. Contractions: Irritability. Vag. Bleeding: None.  Movement: Present. Denies leaking of fluid.   The following portions of the patient's history were reviewed and updated as appropriate: allergies, current medications, past family history, past medical history, past social history, past surgical history and problem list.   Objective:   Vitals:   06/22/19 1036  BP: 117/74  Pulse: 89  Weight: 118 lb (53.5 kg)    Fetal Status: Fetal Heart Rate (bpm): 138   Movement: Present     General:  Alert, oriented and cooperative. Patient is in no acute distress.  Skin: Skin is warm and dry. No rash noted.   Cardiovascular: Normal heart rate noted  Respiratory: Normal respiratory effort, no problems with respiration noted  Abdomen: Soft, gravid, appropriate for gestational age.  Pain/Pressure: Present     Pelvic: Cervical exam deferred        Extremities: Normal range of motion.  Edema: None  Mental Status: Normal mood and affect. Normal behavior. Normal judgment and thought content.   Assessment and Plan:  Pregnancy: [id number] at [redacted]w[redacted]d  1. Supervision of high risk pregnancy, antepartum  2. History of preterm delivery Declined 17 P  3. Pruritic urticarial papules and plaques of  pregnancy Getting better, taking claritin prn  4. Tobacco use disorder affecting pregnancy, antepartum  5. Sickle cell trait (HCC) Urine cx Q trim  6. Vaginal discharge Likely yeast infection, terazole sent to pharmacy   Preterm labor symptoms and general obstetric precautions including but not limited to vaginal bleeding, contractions, leaking of fluid and fetal movement were reviewed in detail with the patient. Please refer to After Visit Summary for other counseling recommendations.   Return in about 2 weeks (around 07/06/2019) for OB visit (MD), in person.  No future appointments.  Sloan Leiter, MD

## 2019-06-23 LAB — URINE CULTURE

## 2019-07-06 ENCOUNTER — Encounter: Payer: Medicaid Other | Admitting: Obstetrics & Gynecology

## 2019-07-10 ENCOUNTER — Encounter (HOSPITAL_COMMUNITY): Payer: Self-pay

## 2019-07-10 ENCOUNTER — Other Ambulatory Visit: Payer: Self-pay

## 2019-07-10 ENCOUNTER — Inpatient Hospital Stay (HOSPITAL_COMMUNITY)
Admission: AD | Admit: 2019-07-10 | Discharge: 2019-07-10 | Disposition: A | Discharge status: Home / Self Care | Payer: Medicaid Other | Attending: Obstetrics & Gynecology | Admitting: Obstetrics & Gynecology

## 2019-07-10 DIAGNOSIS — D573 Sickle-cell trait: Secondary | ICD-10-CM | POA: Diagnosis not present

## 2019-07-10 DIAGNOSIS — B373 Candidiasis of vulva and vagina: Secondary | ICD-10-CM | POA: Diagnosis not present

## 2019-07-10 DIAGNOSIS — R102 Pelvic and perineal pain: Secondary | ICD-10-CM

## 2019-07-10 DIAGNOSIS — B3731 Acute candidiasis of vulva and vagina: Secondary | ICD-10-CM

## 2019-07-10 DIAGNOSIS — R109 Unspecified abdominal pain: Secondary | ICD-10-CM | POA: Diagnosis present

## 2019-07-10 DIAGNOSIS — L292 Pruritus vulvae: Secondary | ICD-10-CM

## 2019-07-10 DIAGNOSIS — O98813 Other maternal infectious and parasitic diseases complicating pregnancy, third trimester: Secondary | ICD-10-CM | POA: Insufficient documentation

## 2019-07-10 DIAGNOSIS — F17210 Nicotine dependence, cigarettes, uncomplicated: Secondary | ICD-10-CM | POA: Insufficient documentation

## 2019-07-10 DIAGNOSIS — Z3A35 35 weeks gestation of pregnancy: Secondary | ICD-10-CM | POA: Insufficient documentation

## 2019-07-10 DIAGNOSIS — O26899 Other specified pregnancy related conditions, unspecified trimester: Secondary | ICD-10-CM

## 2019-07-10 DIAGNOSIS — O26893 Other specified pregnancy related conditions, third trimester: Secondary | ICD-10-CM | POA: Diagnosis not present

## 2019-07-10 DIAGNOSIS — O99013 Anemia complicating pregnancy, third trimester: Secondary | ICD-10-CM | POA: Insufficient documentation

## 2019-07-10 DIAGNOSIS — O99333 Smoking (tobacco) complicating pregnancy, third trimester: Secondary | ICD-10-CM | POA: Diagnosis not present

## 2019-07-10 LAB — URINALYSIS, ROUTINE W REFLEX MICROSCOPIC
Bacteria, UA: NONE SEEN
Bilirubin Urine: NEGATIVE
Glucose, UA: NEGATIVE mg/dL
Hgb urine dipstick: NEGATIVE
Ketones, ur: NEGATIVE mg/dL
Nitrite: NEGATIVE
Protein, ur: NEGATIVE mg/dL
Specific Gravity, Urine: 1.017 (ref 1.005–1.030)
pH: 7 (ref 5.0–8.0)

## 2019-07-10 LAB — WET PREP, GENITAL
Clue Cells Wet Prep HPF POC: NONE SEEN
Trich, Wet Prep: NONE SEEN

## 2019-07-10 MED ORDER — TERCONAZOLE 0.4 % VA CREA: 1.0000 | TOPICAL_CREAM | Freq: Every day | VAGINAL | 0 refills | Status: AC

## 2019-07-10 NOTE — Discharge Instructions (Signed)
Vaginal Yeast infection, Adult  Vaginal yeast infection is a condition that causes vaginal discharge as well as soreness, swelling, and redness (inflammation) of the vagina. This is a common condition. Some women get this infection frequently. What are the causes? This condition is caused by a change in the normal balance of the yeast (candida) and bacteria that live in the vagina. This change causes an overgrowth of yeast, which causes the inflammation. What increases the risk? The condition is more likely to develop in women who:  Take antibiotic medicines.  Have diabetes.  Take birth control pills.  Are pregnant.  Douche often.  Have a weak body defense system (immune system).  Have been taking steroid medicines for a long time.  Frequently wear tight clothing. What are the signs or symptoms? Symptoms of this condition include:  White, thick, creamy vaginal discharge.  Swelling, itching, redness, and irritation of the vagina. The lips of the vagina (vulva) may be affected as well.  Pain or a burning feeling while urinating.  Pain during sex. How is this diagnosed? This condition is diagnosed based on:  Your medical history.  A physical exam.  A pelvic exam. Your health care provider will examine a sample of your vaginal discharge under a microscope. Your health care provider may send this sample for testing to confirm the diagnosis. How is this treated? This condition is treated with medicine. Medicines may be over-the-counter or prescription. You may be told to use one or more of the following:  Medicine that is taken by mouth (orally).  Medicine that is applied as a cream (topically).  Medicine that is inserted directly into the vagina (suppository). Follow these instructions at home:  Lifestyle  Do not have sex until your health care provider approves. Tell your sex partner that you have a yeast infection. That person should go to his or her health care  provider and ask if they should also be treated.  Do not wear tight clothes, such as pantyhose or tight pants.  Wear breathable cotton underwear. General instructions  Take or apply over-the-counter and prescription medicines only as told by your health care provider.  Eat more yogurt. This may help to keep your yeast infection from returning.  Do not use tampons until your health care provider approves.  Try taking a sitz bath to help with discomfort. This is a warm water bath that is taken while you are sitting down. The water should only come up to your hips and should cover your buttocks. Do this 3-4 times per day or as told by your health care provider.  Do not douche.  If you have diabetes, keep your blood sugar levels under control.  Keep all follow-up visits as told by your health care provider. This is important. Contact a health care provider if:  You have a fever.  Your symptoms go away and then return.  Your symptoms do not get better with treatment.  Your symptoms get worse.  You have new symptoms.  You develop blisters in or around your vagina.  You have blood coming from your vagina and it is not your menstrual period.  You develop pain in your abdomen. Summary  Vaginal yeast infection is a condition that causes discharge as well as soreness, swelling, and redness (inflammation) of the vagina.  This condition is treated with medicine. Medicines may be over-the-counter or prescription.  Take or apply over-the-counter and prescription medicines only as told by your health care provider.  Do not douche.  Do not have sex or use tampons until your health care provider approves.  Contact a health care provider if your symptoms do not get better with treatment or your symptoms go away and then return. This information is not intended to replace advice given to you by your health care provider. Make sure you discuss any questions you have with your health care  provider. Document Released: 09/03/2005 Document Revised: 04/12/2018 Document Reviewed: 04/12/2018 Elsevier Patient Education  [place]. Signs and Symptoms of Labor Labor is your body's natural process of moving your baby, placenta, and umbilical cord out of your uterus. The process of labor usually starts when your baby is full-term, between 56 and 40 weeks of pregnancy. How will I know when I am close to going into labor? As your body prepares for labor and the birth of your baby, you may notice the following symptoms in the weeks and days before true labor starts:  Having a strong desire to get your home ready to receive your new baby. This is called nesting. Nesting may be a sign that labor is approaching, and it may occur several weeks before birth. Nesting may involve cleaning and organizing your home.  Passing a small amount of thick, bloody mucus out of your vagina (normal bloody show or losing your mucus plug). This may happen more than a week before labor begins, or it might occur right before labor begins as the opening of the cervix starts to widen (dilate). For some women, the entire mucus plug passes at once. For others, smaller portions of the mucus plug may gradually pass over several days.  Your baby moving (dropping) lower in your pelvis to get into position for birth (lightening). When this happens, you may feel more pressure on your bladder and pelvic bone and less pressure on your ribs. This may make it easier to breathe. It may also cause you to need to urinate more often and have problems with bowel movements.  Having "practice contractions" (Braxton Hicks contractions) that occur at irregular (unevenly spaced) intervals that are more than 10 minutes apart. This is also called false labor. False labor contractions are common after exercise or sexual activity, and they will stop if you change position, rest, or drink fluids. These contractions are usually mild and do not  get stronger over time. They may feel like: ? A backache or back pain. ? Mild cramps, similar to menstrual cramps. ? Tightening or pressure in your abdomen. Other early symptoms that labor may be starting soon include:  Nausea or loss of appetite.  Diarrhea.  Having a sudden burst of energy, or feeling very tired.  Mood changes.  Having trouble sleeping. How will I know when labor has begun? Signs that true labor has begun may include:  Having contractions that come at regular (evenly spaced) intervals and increase in intensity. This may feel like more intense tightening or pressure in your abdomen that moves to your back. ? Contractions may also feel like rhythmic pain in your upper thighs or back that comes and goes at regular intervals. ? For first-time mothers, this change in intensity of contractions often occurs at a more gradual pace. ? Women who have given birth before may notice a more rapid progression of contraction changes.  Having a feeling of pressure in the vaginal area.  Your water breaking (rupture of membranes). This is when the sac of fluid that surrounds your baby breaks. When this happens, you will notice fluid leaking from  your vagina. This may be clear or blood-tinged. Labor usually starts within 24 hours of your water breaking, but it may take longer to begin. ? Some women notice this as a gush of fluid. ? Others notice that their underwear repeatedly becomes damp. Follow these instructions at home:   When labor starts, or if your water breaks, call your health care provider or nurse care line. Based on your situation, they will determine when you should go in for an exam.  When you are in early labor, you may be able to rest and manage symptoms at home. Some strategies to try at home include: ? Breathing and relaxation techniques. ? Taking a warm bath or shower. ? Listening to music. ? Using a heating pad on the lower back for pain. If you are directed to  use heat:  Place a towel between your skin and the heat source.  Leave the heat on for 20-30 minutes.  Remove the heat if your skin turns bright red. This is especially important if you are unable to feel pain, heat, or cold. You may have a greater risk of getting burned. Get help right away if:  You have painful, regular contractions that are 5 minutes apart or less.  Labor starts before you are [redacted] weeks along in your pregnancy.  You have a fever.  You have a headache that does not go away.  You have bright red blood coming from your vagina.  You do not feel your baby moving.  You have a sudden onset of: ? Severe headache with vision problems. ? Nausea, vomiting, or diarrhea. ? Chest pain or shortness of breath. These symptoms may be an emergency. If your health care provider recommends that you go to the hospital or birth center where you plan to deliver, do not drive yourself. Have someone else drive you, or call emergency services (911 in the [place] Summary  Labor is your body's natural process of moving your baby, placenta, and umbilical cord out of your uterus.  The process of labor usually starts when your baby is full-term, between 7337 and 40 weeks of pregnancy.  When labor starts, or if your water breaks, call your health care provider or nurse care line. Based on your situation, they will determine when you should go in for an exam. This information is not intended to replace advice given to you by your health care provider. Make sure you discuss any questions you have with your health care provider. Document Released: 05/01/2017 Document Revised: 08/24/2017 Document Reviewed: 05/01/2017 Elsevier Patient Education  2020 [employer]

## 2019-07-10 NOTE — MAU Note (Signed)
Julia Roman is a 23 y.o. at [redacted]w[redacted]d here in MAU reporting: since last night she has been having a lot pressure and her legs feel sore. States it was worse last night. No bleeding or LOF. +FM  Onset of complaint: last night  Pain score: 3/10  Vitals:   07/10/19 1758  BP: 112/69  Pulse: 82  Resp: 16  Temp: 98.8 F (37.1 C)  SpO2: 98%     FHT: +FM  Lab orders placed from triage: UA, states she just used bathroom and is unable to give sample at this time

## 2019-07-10 NOTE — MAU Provider Note (Signed)
History     CSN: [id number]  Arrival date and time: 07/10/19 1710   First Provider Initiated Contact with Patient 07/10/19 1855      Chief Complaint  Patient presents with  . Abdominal Pain   Julia Roman is a 23 y.o. [id number] at [redacted]w[redacted]d who presents to MAU for pelvic pressure.  Onset: last night around 2300 Location: pelvis/upper thighs Duration: <24hrs Character: pressure better now than it was last night, pt reports the pressure is almost entirely gone at this time, pt reports she was able to sleep through the night Aggravating/Associated: none/none Relieving: none Treatment: none  Pt reports she was prescribed a hydrocortisone cream two weeks ago for vulvar itching. Pt reports when she used the cream it took away the itching, but once she stopped using the cream the itching returned.   Pt denies VB, LOF, ctx, decreased FM, vaginal discharge/odor. Pt denies N/V, abdominal pain, constipation, diarrhea, or urinary problems. Pt denies fever, chills, fatigue, sweating or changes in appetite. Pt denies SOB or chest pain. Pt denies dizziness, HA, light-headedness, weakness.  Problems this pregnancy include: hx PTD, sickle cell trait. Allergies? Tramadol Current medications/supplements? PNVs Prenatal care provider? [facility], pt to call to reschedule next OB appt   OB History    Gravida  4   Para  2   Term  1   Preterm  1   AB  1   Living  2     SAB  1   TAB      Ectopic      Multiple      Live Births  2           Past Medical History:  Diagnosis Date  . Chlamydia 2010  . Depression AGE 45   NO MEDS CURRENTLY  . Gonorrhea 2010  . Ovarian cyst 2013  . Sickle cell trait ([place])   . Trichomonas vaginalis infection 4/282015  . Urinary tract infection    OCC    Past Surgical History:  Procedure Laterality Date  . NO PAST SURGERIES      Family History  Problem Relation Age of Onset  . Asthma Mother   . Fibromyalgia Mother   . Asthma Brother    . Arthritis Maternal Grandfather   . Diabetes Maternal Grandfather   . Hypertension Maternal Grandfather   . Kidney disease Maternal Grandfather   . Stroke Maternal Grandfather   . Birth defects Cousin        CLEFT LIP  . Cancer Cousin   . Cancer Maternal Aunt        MOUTH  . Hypertension Maternal Aunt   . Heart disease Maternal Grandmother   . Hyperlipidemia Maternal Grandmother   . Hypertension Maternal Grandmother   . Diabetes Paternal Grandmother   . Hypertension Maternal Aunt     Social History   Tobacco Use  . Smoking status: Current Every Day Smoker    Packs/day: 0.25    Types: Cigarettes  . Smokeless tobacco: Never Used  Substance Use Topics  . Alcohol use: Not Currently    Comment: 1 times a week  . Drug use: Yes    Frequency: 7.0 times per week    Types: Marijuana    Allergies:  Allergies  Allergen Reactions  . Tramadol Hives and Rash    Entire body rash    Medications Prior to Admission  Medication Sig Dispense Refill Last Dose  . docusate sodium (COLACE) 100 MG capsule Take 1 capsule (100  mg total) by mouth 2 (two) times daily as needed. (Patient not taking: Reported on 06/22/2019) 60 capsule 1   . ferrous sulfate 324 (65 Fe) MG TBEC Take 1 tablet (325 mg total) by mouth daily. 30 tablet 2   . hydrocortisone 1 % ointment Apply 1 application topically 2 (two) times daily as needed for itching. 56 g 0   . loratadine (CLARITIN) 10 MG tablet Take 1 tablet (10 mg total) by mouth daily. 30 tablet 3   . Prenatal Vit-Fe Phos-FA-Omega (VITAFOL GUMMIES) 3.33-0.333-34.8 MG CHEW Chew 3 each by mouth daily. 90 tablet 11     Review of Systems  Constitutional: Negative for chills, diaphoresis, fatigue and fever.  Respiratory: Negative for shortness of breath.   Cardiovascular: Negative for chest pain.  Gastrointestinal: Negative for abdominal pain, constipation, diarrhea, nausea and vomiting.  Genitourinary: Positive for pelvic pain. Negative for dysuria, flank  pain, frequency, urgency, vaginal bleeding and vaginal discharge.  Neurological: Negative for dizziness, weakness, light-headedness and headaches.   Physical Exam   Blood pressure 112/69, pulse 82, temperature 98.8 F (37.1 C), temperature source Oral, resp. rate 16, height 5\' 2"  (1.575 m), weight 55.1 kg, last menstrual period 11/03/2018, SpO2 98 %, unknown if currently breastfeeding.  Patient Vitals for the past 24 hrs:  BP Temp Temp src Pulse Resp SpO2 Height Weight  07/10/19 1758 112/69 98.8 F (37.1 C) Oral 82 16 98 % - -  07/10/19 1751 - - - - - - 5\' 2"  (1.575 m) 55.1 kg   Physical Exam  Constitutional: She is oriented to person, place, and time. She appears well-developed and well-nourished. No distress.  HENT:  Head: Normocephalic and atraumatic.  Respiratory: Effort normal.  GI: Soft.  Genitourinary: There is no rash, tenderness or lesion on the right labia. There is no rash, tenderness or lesion on the left labia. Uterus is enlarged. Uterus is not tender. Cervix exhibits no motion tenderness, no discharge and no friability.    Vaginal discharge (moderately thick, smooth, white discharge with minimal amount of white-yellow clumped discharge) present.     No vaginal tenderness or bleeding.  No tenderness or bleeding in the vagina.    Genitourinary Comments: CE: 1/long/midline   Neurological: She is alert and oriented to person, place, and time.  Skin: Skin is warm and dry. She is not diaphoretic.  Psychiatric: She has a normal mood and affect. Her behavior is normal. Judgment and thought content normal.   Results for orders placed or performed during the hospital encounter of 07/10/19 (from the past 24 hour(s))  Urinalysis, Routine w reflex microscopic     Status: Abnormal   Collection Time: 07/10/19  6:34 PM  Result Value Ref Range   Color, Urine YELLOW YELLOW   APPearance HAZY (A) CLEAR   Specific Gravity, Urine 1.017 1.005 - 1.030   pH 7.0 5.0 - 8.0   Glucose, UA  NEGATIVE NEGATIVE mg/dL   Hgb urine dipstick NEGATIVE NEGATIVE   Bilirubin Urine NEGATIVE NEGATIVE   Ketones, ur NEGATIVE NEGATIVE mg/dL   Protein, ur NEGATIVE NEGATIVE mg/dL   Nitrite NEGATIVE NEGATIVE   Leukocytes,Ua TRACE (A) NEGATIVE   RBC / HPF 0-5 0 - 5 RBC/hpf   WBC, UA 0-5 0 - 5 WBC/hpf   Bacteria, UA NONE SEEN NONE SEEN   Squamous Epithelial / LPF 11-20 0 - 5   Mucus PRESENT   Wet prep, genital     Status: Abnormal   Collection Time: 07/10/19  7:05 PM  Specimen: Vaginal  Result Value Ref Range   Yeast Wet Prep HPF POC (A) NONE SEEN    Swab received with less than 0.5 mL of saline, saline added to specimen, interpret results with caution.   Trich, Wet Prep NONE SEEN NONE SEEN   Clue Cells Wet Prep HPF POC NONE SEEN NONE SEEN   WBC, Wet Prep HPF POC MANY (A) NONE SEEN   Sperm PRESENT    No results found.  MAU Course  Procedures  MDM -pelvic pressure, almost entirely resolved at this time -vulvar itching -UA: hazy/trace leuks, sending urine for culture based on pt symptoms -WetPrep: +yeast, many WBCs, otherwise WNL -GC/CT collected -EFM: reactive       -baseline: 135       -variability: moderate       -accels: present, 15x15       -decels: absent       -TOCO: no ctx -pt discharged to home in stable condition  Orders Placed This Encounter  Procedures  . Wet prep, genital    Standing Status:   Standing    Number of Occurrences:   1  . Culture, OB Urine    Standing Status:   Standing    Number of Occurrences:   1  . Urinalysis, Routine w reflex microscopic    Standing Status:   Standing    Number of Occurrences:   1  . Discharge patient    Order Specific Question:   Discharge disposition    Answer:   01-Home or Self Care [1]    Order Specific Question:   Discharge patient date    Answer:   07/10/2019   Meds ordered this encounter  Medications  . terconazole (TERAZOL 7) 0.4 % vaginal cream    Sig: Place 1 applicator vaginally at bedtime for 7 days.     Dispense:  45 g    Refill:  0    Order Specific Question:   Supervising Provider    Answer:   Jaynie CollinsANYANWU, UGONNA A [[id number]]   Assessment and Plan   1. Pelvic pressure in pregnancy   2. Vulvar itching   3. Yeast infection involving the vagina and surrounding area    Allergies as of 07/10/2019      Reactions   Tramadol Hives, Rash   Entire body rash      Medication List    TAKE these medications   docusate sodium 100 MG capsule Commonly known as: COLACE Take 1 capsule (100 mg total) by mouth 2 (two) times daily as needed.   ferrous sulfate 324 (65 Fe) MG Tbec Take 1 tablet (325 mg total) by mouth daily.   hydrocortisone 1 % ointment Apply 1 application topically 2 (two) times daily as needed for itching.   loratadine 10 MG tablet Commonly known as: CLARITIN Take 1 tablet (10 mg total) by mouth daily.   terconazole 0.4 % vaginal cream Commonly known as: TERAZOL 7 Place 1 applicator vaginally at bedtime for 7 days.   Vitafol Gummies 3.33-0.333-34.8 MG Chew Chew 3 each by mouth daily.      -will call with culture results, if positive -strict PTL/return MAU precautions discussed -call [facility] clinic tomorrow AM to schedule OB appt -pt discharged to home in stable condition  Joni Reiningicole E Dustee Bottenfield 07/10/2019, 7:52 PM

## 2019-07-11 ENCOUNTER — Telehealth: Payer: Self-pay | Admitting: Family Medicine

## 2019-07-11 NOTE — Telephone Encounter (Signed)
Ms. Trabert called to reschedule her appointment. When asked if she had the MyChart app, she stated she did. When I told her I could see she had not signed up for it yet. She stated why does she have to have it. Then said well I don't want it. She wants all her appointments to be in the office.

## 2019-07-12 LAB — GC/CHLAMYDIA PROBE AMP (~~LOC~~) NOT AT ARMC
Chlamydia: NEGATIVE
Neisseria Gonorrhea: NEGATIVE

## 2019-07-13 LAB — CULTURE, OB URINE

## 2019-07-15 ENCOUNTER — Encounter (HOSPITAL_COMMUNITY): Payer: Self-pay

## 2019-07-15 ENCOUNTER — Inpatient Hospital Stay (HOSPITAL_COMMUNITY)
Admission: AD | Admit: 2019-07-15 | Discharge: 2019-07-15 | Disposition: A | Discharge status: Home / Self Care | Payer: Medicaid Other | Attending: Obstetrics and Gynecology | Admitting: Obstetrics and Gynecology

## 2019-07-15 ENCOUNTER — Other Ambulatory Visit: Payer: Self-pay

## 2019-07-15 DIAGNOSIS — O479 False labor, unspecified: Secondary | ICD-10-CM

## 2019-07-15 DIAGNOSIS — Z3A36 36 weeks gestation of pregnancy: Secondary | ICD-10-CM | POA: Diagnosis not present

## 2019-07-15 DIAGNOSIS — O4703 False labor before 37 completed weeks of gestation, third trimester: Secondary | ICD-10-CM | POA: Insufficient documentation

## 2019-07-15 DIAGNOSIS — O26893 Other specified pregnancy related conditions, third trimester: Secondary | ICD-10-CM | POA: Diagnosis present

## 2019-07-15 HISTORY — DX: Headache, unspecified: R51.9

## 2019-07-15 HISTORY — DX: Anemia, unspecified: D64.9

## 2019-07-15 LAB — URINALYSIS, ROUTINE W REFLEX MICROSCOPIC
Bilirubin Urine: NEGATIVE
Glucose, UA: NEGATIVE mg/dL
Hgb urine dipstick: NEGATIVE
Ketones, ur: 80 mg/dL — AB
Leukocytes,Ua: NEGATIVE
Nitrite: NEGATIVE
Protein, ur: NEGATIVE mg/dL
Specific Gravity, Urine: 1.013 (ref 1.005–1.030)
pH: 7 (ref 5.0–8.0)

## 2019-07-15 MED ORDER — LACTATED RINGERS IV BOLUS
1000.0000 mL | Freq: Once | INTRAVENOUS | Status: AC
  Administered 2019-07-15: 17:00:00 1000 mL via INTRAVENOUS

## 2019-07-15 NOTE — Discharge Instructions (Signed)

## 2019-07-15 NOTE — MAU Provider Note (Signed)
History     [id number]  Arrival date and time: 07/15/19 1504    Chief Complaint  Patient presents with  . Contractions  . Emesis     HPI Julia Roman is a 23 y.o. at [redacted]w[redacted]d by 5 and 19 week [place] c/w LMP, who presents for contractions and feeling weak.   Reports feeling weak Does not have a good appetite, has not eaten much Endorses some intermittent nausea, no vomiting Denies dysuria, vaginal discharge Has been taking med for yeast infection prescribed at last MAU visit on 07/10/2019, reports improvement in symptoms No new foods  Has been also feeling contractions Not sure if they're braxton hicks or real Feeling a lot of pelvic pressure as well  Vaginal bleeding: No LOF: No Fetal Movement: Yes Contractions: Yes  AB/Positive/-- (02/10 1119)  OB History    Gravida  4   Para  2   Term  1   Preterm  1   AB  1   Living  2     SAB  1   TAB      Ectopic      Multiple      Live Births  2           Past Medical History:  Diagnosis Date  . Anemia   . Chlamydia 2010  . Depression AGE 31   NO MEDS CURRENTLY  . Gonorrhea 2010  . Headache   . Ovarian cyst 2013  . Sickle cell trait ([place])   . Trichomonas vaginalis infection 4/282015  . Urinary tract infection    OCC    Past Surgical History:  Procedure Laterality Date  . NO PAST SURGERIES      Family History  Problem Relation Age of Onset  . Asthma Mother   . Fibromyalgia Mother   . Asthma Brother   . Arthritis Maternal Grandfather   . Diabetes Maternal Grandfather   . Hypertension Maternal Grandfather   . Kidney disease Maternal Grandfather   . Stroke Maternal Grandfather   . Birth defects Cousin        CLEFT LIP  . Cancer Cousin   . Cancer Maternal Aunt        MOUTH  . Hypertension Maternal Aunt   . Heart disease Maternal Grandmother   . Hyperlipidemia Maternal Grandmother   . Hypertension Maternal Grandmother   . Diabetes Paternal Grandmother   . Hypertension Maternal Aunt      Social History   Socioeconomic History  . Marital status: Single    Spouse name: Not on file  . Number of children: Not on file  . Years of education: 11+  . Highest education level: Not on file  Occupational History  . 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 Financial resource strain: Not on file  . Food insecurity    Worry: Not on file    Inability: Not on file  . Transportation needs    Medical: Not on file    Non-medical: Not on file  Tobacco Use  . Smoking status: Current Every Day Smoker    Packs/day: 0.25    Types: Cigarettes  . Smokeless tobacco: Never Used  Substance and Sexual Activity  . Alcohol use: Not Currently    Comment: 1 times a week  . Drug use: Yes    Frequency: 7.0 times per week    Types: Marijuana    Comment: daily  . Sexual activity: Yes    Partners:  Male    Birth control/protection: None  Lifestyle  . Physical activity    Days per week: Not on file    Minutes per session: Not on file  . Stress: Not on file  Relationships  . Social Musicianconnections    Talks on phone: Not on file    Gets together: Not on file    Attends religious service: Not on file    Active member of club or organization: Not on file    Attends meetings of clubs or organizations: Not on file    Relationship status: Not on file  . Intimate partner violence    Fear of current or ex partner: Not on file    Emotionally abused: Not on file    Physically abused: Not on file    Forced sexual activity: Not on file  Other Topics Concern  . Not on file  Social History Narrative  . Not on file    Allergies  Allergen Reactions  . Tramadol Hives and Rash    Entire body rash    No current facility-administered medications on file prior to encounter.    Current Outpatient Medications on File Prior to Encounter  Medication Sig Dispense Refill  . docusate sodium (COLACE) 100 MG capsule Take 1 capsule (100 mg total) by mouth 2 (two) times daily as  needed. (Patient not taking: Reported on 06/22/2019) 60 capsule 1  . ferrous sulfate 324 (65 Fe) MG TBEC Take 1 tablet (325 mg total) by mouth daily. 30 tablet 2  . hydrocortisone 1 % ointment Apply 1 application topically 2 (two) times daily as needed for itching. 56 g 0  . loratadine (CLARITIN) 10 MG tablet Take 1 tablet (10 mg total) by mouth daily. 30 tablet 3  . Prenatal Vit-Fe Phos-FA-Omega (VITAFOL GUMMIES) 3.33-0.333-34.8 MG CHEW Chew 3 each by mouth daily. 90 tablet 11  . terconazole (TERAZOL 7) 0.4 % vaginal cream Place 1 applicator vaginally at bedtime for 7 days. 45 g 0     ROS Complete ROS completed and otherwise negative except as noted in HPI  Physical Exam   BP 118/74 (BP Location: Right Arm)   Temp 98.4 F (36.9 C) (Oral)   Resp 18   Ht 5\' 2"  (1.575 m)   Wt 55 kg   LMP 11/03/2018 (Exact Date)   BMI 22.19 kg/m   Physical Exam  Vitals reviewed. Constitutional: She appears well-developed and well-nourished. No distress.  Cardiovascular: Normal rate and regular rhythm.  Respiratory: Effort normal and breath sounds normal. No respiratory distress.  GI: Soft. Bowel sounds are normal. She exhibits no distension. There is no abdominal tenderness. There is no rebound and no guarding.  Musculoskeletal:        General: No edema.  Neurological: She is alert. Coordination normal.  Skin: Skin is warm and dry. She is not diaphoretic.  Psychiatric: She has a normal mood and affect.    Bedside Ultrasound Not performed  FHT Normal NST Baseline 140, mod variability, +accels, no decels, some uterine irritability and intermittent contractions Cat I  SVE 1/30/ballotable, unchanged over 1.5 hours  MAU Course  Procedures NST  MDM Moderate  Assessment and Plan  #False labor Patient given 1L LR bolus with improvement in symptoms. No signs or symptoms of other infectious process, yeast symptoms have resolved since last MAU visit. Likely symptoms of late pregnancy.   Cervix examined and found to be 1/30/ballotable, she was rechecked after 1.5 hours and found to be unchanged.  Reviewed labor  precautions in detail, d/c to home in stable condition, has follow up PNV in three days.   Venora MaplesMatthew M Alleene Stoy

## 2019-07-15 NOTE — MAU Note (Signed)
Since 0600, contraction pressure, nausea, diarrhea, light-headed. Denies LOF, VB +FM. Was seen a few days ago for same symptoms. Has not been able to eat or drink anything.

## 2019-07-18 ENCOUNTER — Encounter: Payer: Medicaid Other | Admitting: Obstetrics & Gynecology

## 2019-07-23 ENCOUNTER — Encounter (HOSPITAL_COMMUNITY): Payer: Self-pay | Admitting: *Deleted

## 2019-07-23 ENCOUNTER — Inpatient Hospital Stay (HOSPITAL_COMMUNITY)
Admission: AD | Admit: 2019-07-23 | Discharge: 2019-07-23 | Disposition: A | Discharge status: Home / Self Care | Payer: Medicaid Other | Source: Ambulatory Visit | Attending: Obstetrics & Gynecology | Admitting: Obstetrics & Gynecology

## 2019-07-23 ENCOUNTER — Other Ambulatory Visit: Payer: Self-pay

## 2019-07-23 DIAGNOSIS — Z3A37 37 weeks gestation of pregnancy: Secondary | ICD-10-CM | POA: Diagnosis not present

## 2019-07-23 DIAGNOSIS — O26893 Other specified pregnancy related conditions, third trimester: Secondary | ICD-10-CM | POA: Insufficient documentation

## 2019-07-23 DIAGNOSIS — O0993 Supervision of high risk pregnancy, unspecified, third trimester: Secondary | ICD-10-CM

## 2019-07-23 DIAGNOSIS — Z0371 Encounter for suspected problem with amniotic cavity and membrane ruled out: Secondary | ICD-10-CM | POA: Diagnosis not present

## 2019-07-23 DIAGNOSIS — Z3689 Encounter for other specified antenatal screening: Secondary | ICD-10-CM

## 2019-07-23 DIAGNOSIS — O479 False labor, unspecified: Secondary | ICD-10-CM

## 2019-07-23 DIAGNOSIS — O0990 Supervision of high risk pregnancy, unspecified, unspecified trimester: Secondary | ICD-10-CM

## 2019-07-23 NOTE — MAU Provider Note (Signed)
None      S: Ms. Julia Roman is a 23 y.o. [phone] at [redacted]w[redacted]d  who presents to MAU today complaining of leaking of fluid since this evening. She denies vaginal bleeding. She endorses contractions. She reports normal fetal movement.    O: BP 119/74 (BP Location: Right Arm)   Pulse 63   Temp 98.7 F (37.1 C)   Resp 18   Ht 5\' 2"  (1.575 m)   Wt 55.8 kg   LMP 11/03/2018 (Exact Date)   BMI 22.50 kg/m  GENERAL: Well-developed, well-nourished female in no acute distress.  HEAD: Normocephalic, atraumatic.  CHEST: Normal effort of breathing, regular heart rate ABDOMEN: Soft, nontender, gravid PELVIC: Normal external female genitalia. Vagina is pink and rugated. Cervix with normal contour, no lesions. Normal discharge.  + pooling of milky white fluid. [employer]; No ferning noted, +Sperm  Cervical exam:  Dilation: 3.5 Effacement (%): 60 Cervical Position: Posterior Station: -2 Presentation: Vertex Exam by:: DCALLAWAY, RN   Fetal Monitoring: FHT: 135 bpm, Mod Var, -Decels, +Accels Toco: Irregular  No results found for this or any previous visit (from the past 24 hour(s)).   A: [facility] at [redacted]w[redacted]d  Membranes intact  No Leakage of Amniotic Fluid Cat I FT   P: No cervical change. NST Reactive Will discharge to home Encouraged to call or return to MAU if symptoms worsen or with the onset of new symptoms. Labor Precautions  Gavin Pound, CNM 07/23/2019 5:49 AM

## 2019-07-23 NOTE — Discharge Instructions (Signed)
Braxton Hicks Contractions Contractions of the uterus can occur throughout pregnancy, but they are not always a sign that you are in labor. You may have practice contractions called Braxton Hicks contractions. These false labor contractions are sometimes confused with true labor. What are Braxton Hicks contractions? Braxton Hicks contractions are tightening movements that occur in the muscles of the uterus before labor. Unlike true labor contractions, these contractions do not result in opening (dilation) and thinning of the cervix. Toward the end of pregnancy (32-34 weeks), Braxton Hicks contractions can happen more often and may become stronger. These contractions are sometimes difficult to tell apart from true labor because they can be very uncomfortable. You should not feel embarrassed if you go to the hospital with false labor. Sometimes, the only way to tell if you are in true labor is for your health care provider to look for changes in the cervix. The health care provider will do a physical exam and may monitor your contractions. If you are not in true labor, the exam should show that your cervix is not dilating and your water has not broken. If there are no other health problems associated with your pregnancy, it is completely safe for you to be sent home with false labor. You may continue to have Braxton Hicks contractions until you go into true labor. How to tell the difference between true labor and false labor True labor  Contractions last 30-70 seconds.  Contractions become very regular.  Discomfort is usually felt in the top of the uterus, and it spreads to the lower abdomen and low back.  Contractions do not go away with walking.  Contractions usually become more intense and increase in frequency.  The cervix dilates and gets thinner. False labor  Contractions are usually shorter and not as strong as true labor contractions.  Contractions are usually irregular.  Contractions  are often felt in the front of the lower abdomen and in the groin.  Contractions may go away when you walk around or change positions while lying down.  Contractions get weaker and are shorter-lasting as time goes on.  The cervix usually does not dilate or become thin. Follow these instructions at home:   Take over-the-counter and prescription medicines only as told by your health care provider.  Keep up with your usual exercises and follow other instructions from your health care provider.  Eat and drink lightly if you think you are going into labor.  If Braxton Hicks contractions are making you uncomfortable: ? Change your position from lying down or resting to walking, or change from walking to resting. ? Sit and rest in a tub of warm water. ? Drink enough fluid to keep your urine pale yellow. Dehydration may cause these contractions. ? Do slow and deep breathing several times an hour.  Keep all follow-up prenatal visits as told by your health care provider. This is important. Contact a health care provider if:  You have a fever.  You have continuous pain in your abdomen. Get help right away if:  Your contractions become stronger, more regular, and closer together.  You have fluid leaking or gushing from your vagina.  You pass blood-tinged mucus (bloody show).  You have bleeding from your vagina.  You have low back pain that you never had before.  You feel your baby's head pushing down and causing pelvic pressure.  Your baby is not moving inside you as much as it used to. Summary  Contractions that occur before labor are   called Braxton Hicks contractions, false labor, or practice contractions.  Braxton Hicks contractions are usually shorter, weaker, farther apart, and less regular than true labor contractions. True labor contractions usually become progressively stronger and regular, and they become more frequent.  Manage discomfort from Braxton Hicks contractions  by changing position, resting in a warm bath, drinking plenty of water, or practicing deep breathing. This information is not intended to replace advice given to you by your health care provider. Make sure you discuss any questions you have with your health care provider. Document Released: 04/09/2017 Document Revised: 11/06/2017 Document Reviewed: 04/09/2017 Elsevier Patient Education  2020 Elsevier Inc[employer]  

## 2019-07-23 NOTE — MAU Note (Signed)
Reports ctx all night about 5-7 min apart. Denies any bleeding but reports some leaking. Good fetal movement felt. 1-2cm dilated last week.

## 2019-07-25 ENCOUNTER — Ambulatory Visit (INDEPENDENT_AMBULATORY_CARE_PROVIDER_SITE_OTHER): Payer: Medicaid Other | Admitting: Family Medicine

## 2019-07-25 ENCOUNTER — Other Ambulatory Visit: Payer: Self-pay

## 2019-07-25 VITALS — BP 127/74 | HR 78 | Wt 122.0 lb

## 2019-07-25 DIAGNOSIS — Z3A37 37 weeks gestation of pregnancy: Secondary | ICD-10-CM

## 2019-07-25 DIAGNOSIS — O0990 Supervision of high risk pregnancy, unspecified, unspecified trimester: Secondary | ICD-10-CM

## 2019-07-25 DIAGNOSIS — Z8751 Personal history of pre-term labor: Secondary | ICD-10-CM

## 2019-07-25 DIAGNOSIS — D573 Sickle-cell trait: Secondary | ICD-10-CM

## 2019-07-25 DIAGNOSIS — O0993 Supervision of high risk pregnancy, unspecified, third trimester: Secondary | ICD-10-CM

## 2019-07-25 DIAGNOSIS — O09213 Supervision of pregnancy with history of pre-term labor, third trimester: Secondary | ICD-10-CM

## 2019-07-25 NOTE — Addendum Note (Signed)
Addended by: Alric Seton on: 07/25/2019 03:49 PM   Modules accepted: Orders

## 2019-07-25 NOTE — Progress Notes (Signed)
   PRENATAL VISIT NOTE  Subjective:  Julia Roman is a 22 y.o. [phone] at [redacted]w[redacted]d being seen today for ongoing prenatal care.  She is currently monitored for the following issues for this high-risk pregnancy and has Current smoker; Marijuana use; History of depression; Hemoglobin C trait ([place]); Tobacco use disorder affecting pregnancy, antepartum; History of preterm delivery; Supervision of high risk pregnancy, antepartum; Trichomonal vaginitis during pregnancy; Non-compliant pregnant patient; Pruritic urticarial papules and plaques of pregnancy; and Sickle cell trait ([facility]) on their problem list.  Patient reports no complaints.  Contractions: Irregular. Vag. Bleeding: None.  Movement: Present. Denies leaking of fluid.   The following portions of the patient's history were reviewed and updated as appropriate: allergies, current medications, past family history, past medical history, past social history, past surgical history and problem list.   Objective:   Vitals:   07/25/19 1515  BP: 127/74  Pulse: 78  Weight: 122 lb (55.3 kg)    Fetal Status: Fetal Heart Rate (bpm): 145 Fundal Height: 36 cm Movement: Present  Presentation: Vertex  General:  Alert, oriented and cooperative. Patient is in no acute distress.  Skin: Skin is warm and dry. No rash noted.   Cardiovascular: Normal heart rate noted  Respiratory: Normal respiratory effort, no problems with respiration noted  Abdomen: Soft, gravid, appropriate for gestational age.  Pain/Pressure: Present     Pelvic: Cervical exam performed Dilation: 3 Effacement (%): 60 Station: -3  Extremities: Normal range of motion.  Edema: None  Mental Status: Normal mood and affect. Normal behavior. Normal judgment and thought content.   Assessment and Plan:  Pregnancy: [id number] at [redacted]w[redacted]d 1. Supervision of high risk pregnancy, antepartum FHT and FH normal  2. Sickle cell trait ([place])  3. History of preterm delivery Now 37 weeks  Term labor symptoms  and general obstetric precautions including but not limited to vaginal bleeding, contractions, leaking of fluid and fetal movement were reviewed in detail with the patient. Please refer to After Visit Summary for other counseling recommendations.   No follow-ups on file.  Future Appointments  Date Time Provider [place]  08/01/2019  3:15 PM Tykeshia Tourangeau [facility] [place]  08/08/2019  3:15 PM Nehemiah Settle Tanna Savoy, DO [facility] [place], DO

## 2019-07-29 LAB — CULTURE, BETA STREP (GROUP B ONLY): Strep Gp B Culture: NEGATIVE

## 2019-08-01 ENCOUNTER — Other Ambulatory Visit: Payer: Self-pay

## 2019-08-01 ENCOUNTER — Ambulatory Visit (INDEPENDENT_AMBULATORY_CARE_PROVIDER_SITE_OTHER): Payer: Medicaid Other | Admitting: Family Medicine

## 2019-08-01 VITALS — BP 131/78 | HR 89 | Wt 124.0 lb

## 2019-08-01 DIAGNOSIS — O09213 Supervision of pregnancy with history of pre-term labor, third trimester: Secondary | ICD-10-CM

## 2019-08-01 DIAGNOSIS — D573 Sickle-cell trait: Secondary | ICD-10-CM

## 2019-08-01 DIAGNOSIS — Z3A38 38 weeks gestation of pregnancy: Secondary | ICD-10-CM

## 2019-08-01 DIAGNOSIS — O0990 Supervision of high risk pregnancy, unspecified, unspecified trimester: Secondary | ICD-10-CM

## 2019-08-01 DIAGNOSIS — O99013 Anemia complicating pregnancy, third trimester: Secondary | ICD-10-CM

## 2019-08-01 DIAGNOSIS — O0993 Supervision of high risk pregnancy, unspecified, third trimester: Secondary | ICD-10-CM

## 2019-08-01 DIAGNOSIS — Z8751 Personal history of pre-term labor: Secondary | ICD-10-CM

## 2019-08-01 NOTE — Progress Notes (Signed)
   PRENATAL VISIT NOTE  Subjective:  Julia Roman is a 23 y.o. [phone] at [redacted]w[redacted]d being seen today for ongoing prenatal care.  She is currently monitored for the following issues for this high-risk pregnancy and has Current smoker; Marijuana use; History of depression; Hemoglobin C trait ([place]); Tobacco use disorder affecting pregnancy, antepartum; History of preterm delivery; Supervision of high risk pregnancy, antepartum; Trichomonal vaginitis during pregnancy; Non-compliant pregnant patient; Pruritic urticarial papules and plaques of pregnancy; and Sickle cell trait (HCC) on their problem list.  Patient reports no complaints.  Contractions: Irregular. Vag. Bleeding: None.  Movement: Present. Denies leaking of fluid.   The following portions of the patient's history were reviewed and updated as appropriate: allergies, current medications, past family history, past medical history, past social history, past surgical history and problem list.   Objective:   Vitals:   08/01/19 1523  BP: 131/78  Pulse: 89  Weight: 124 lb (56.2 kg)    Fetal Status: Fetal Heart Rate (bpm): 138 Fundal Height: 37 cm Movement: Present  Presentation: Vertex  General:  Alert, oriented and cooperative. Patient is in no acute distress.  Skin: Skin is warm and dry. No rash noted.   Cardiovascular: Normal heart rate noted  Respiratory: Normal respiratory effort, no problems with respiration noted  Abdomen: Soft, gravid, appropriate for gestational age.  Pain/Pressure: Present     Pelvic: Cervical exam deferred Dilation: 3 Effacement (%): 60 Station: -2  Extremities: Normal range of motion.  Edema: None  Mental Status: Normal mood and affect. Normal behavior. Normal judgment and thought content.   Assessment and Plan:  Pregnancy: [id number] at [redacted]w[redacted]d 1. Supervision of high risk pregnancy, antepartum FHT and FH normal  2. Sickle cell trait ([place]) =  3. History of preterm delivery  Term labor symptoms and general  obstetric precautions including but not limited to vaginal bleeding, contractions, leaking of fluid and fetal movement were reviewed in detail with the patient. Please refer to After Visit Summary for other counseling recommendations.   No follow-ups on file.  Future Appointments  Date Time Provider [place]  08/08/2019  3:15 PM Truett Mainland, DO [facility] [place], DO

## 2019-08-05 ENCOUNTER — Telehealth: Payer: Self-pay | Admitting: Family Medicine

## 2019-08-05 NOTE — Telephone Encounter (Signed)
Spoke to patient about her appointment on 8/31 @ 3:15. Patient instructed to wear a face mask for the entire appointment and no visitors are allowed with her during the visit. Patient screened for covid symptoms and denied having any

## 2019-08-08 ENCOUNTER — Ambulatory Visit (INDEPENDENT_AMBULATORY_CARE_PROVIDER_SITE_OTHER): Payer: Medicaid Other | Admitting: Family Medicine

## 2019-08-08 ENCOUNTER — Other Ambulatory Visit: Payer: Self-pay

## 2019-08-08 ENCOUNTER — Encounter: Payer: Self-pay | Admitting: Family Medicine

## 2019-08-08 VITALS — BP 124/77 | HR 77 | Temp 98.6°F | Wt 125.3 lb

## 2019-08-08 DIAGNOSIS — O0990 Supervision of high risk pregnancy, unspecified, unspecified trimester: Secondary | ICD-10-CM

## 2019-08-08 DIAGNOSIS — D573 Sickle-cell trait: Secondary | ICD-10-CM

## 2019-08-08 DIAGNOSIS — Z8751 Personal history of pre-term labor: Secondary | ICD-10-CM

## 2019-08-08 DIAGNOSIS — O0993 Supervision of high risk pregnancy, unspecified, third trimester: Secondary | ICD-10-CM

## 2019-08-08 DIAGNOSIS — O99013 Anemia complicating pregnancy, third trimester: Secondary | ICD-10-CM

## 2019-08-08 DIAGNOSIS — O09213 Supervision of pregnancy with history of pre-term labor, third trimester: Secondary | ICD-10-CM

## 2019-08-08 NOTE — Progress Notes (Signed)
   PRENATAL VISIT NOTE  Subjective:  Julia Roman is a 23 y.o. [phone] at [redacted]w[redacted]d being seen today for ongoing prenatal care.  She is currently monitored for the following issues for this high-risk pregnancy and has Current smoker; Marijuana use; History of depression; Hemoglobin C trait ([place]); Tobacco use disorder affecting pregnancy, antepartum; History of preterm delivery; Supervision of high risk pregnancy, antepartum; Trichomonal vaginitis during pregnancy; Non-compliant pregnant patient; Pruritic urticarial papules and plaques of pregnancy; and Sickle cell trait (HCC) on their problem list.  Patient reports no complaints.  Contractions: Irritability. Vag. Bleeding: None.  Movement: Present. Denies leaking of fluid.   The following portions of the patient's history were reviewed and updated as appropriate: allergies, current medications, past family history, past medical history, past social history, past surgical history and problem list.   Objective:   Vitals:   08/08/19 1522  BP: 124/77  Pulse: 77  Temp: 98.6 F (37 C)  Weight: 125 lb 4.8 oz (56.8 kg)    Fetal Status: Fetal Heart Rate (bpm): 147 Fundal Height: 39 cm Movement: Present  Presentation: Vertex  General:  Alert, oriented and cooperative. Patient is in no acute distress.  Skin: Skin is warm and dry. No rash noted.   Cardiovascular: Normal heart rate noted  Respiratory: Normal respiratory effort, no problems with respiration noted  Abdomen: Soft, gravid, appropriate for gestational age.  Pain/Pressure: Present     Pelvic: Cervical exam performed Dilation: 3.5 Effacement (%): 60 Station: -2  Extremities: Normal range of motion.  Edema: Trace  Mental Status: Normal mood and affect. Normal behavior. Normal judgment and thought content.   Assessment and Plan:  Pregnancy: [id number] at [redacted]w[redacted]d 1. Supervision of high risk pregnancy, antepartum FHT and FH normal  2. Sickle cell trait ([place])   3. History of preterm  delivery   Term labor symptoms and general obstetric precautions including but not limited to vaginal bleeding, contractions, leaking of fluid and fetal movement were reviewed in detail with the patient. Please refer to After Visit Summary for other counseling recommendations.   Return in about 4 weeks (around 09/05/2019) for Postpartum Exam.  Future Appointments  Date Time Provider [place]  08/09/2019  7:30 AM [facility] [place], DO

## 2019-08-09 ENCOUNTER — Inpatient Hospital Stay (HOSPITAL_COMMUNITY): Payer: Medicaid Other | Admitting: Anesthesiology

## 2019-08-09 ENCOUNTER — Encounter (HOSPITAL_COMMUNITY): Payer: Self-pay

## 2019-08-09 ENCOUNTER — Inpatient Hospital Stay (HOSPITAL_COMMUNITY): Payer: Medicaid Other

## 2019-08-09 ENCOUNTER — Inpatient Hospital Stay (HOSPITAL_COMMUNITY)
Admission: AD | Admit: 2019-08-09 | Discharge: 2019-08-11 | DRG: 806 | Disposition: A | Discharge status: Home / Self Care | Payer: Medicaid Other | Attending: Family Medicine | Admitting: Family Medicine

## 2019-08-09 ENCOUNTER — Other Ambulatory Visit: Payer: Self-pay

## 2019-08-09 DIAGNOSIS — O9902 Anemia complicating childbirth: Principal | ICD-10-CM | POA: Diagnosis present

## 2019-08-09 DIAGNOSIS — Z3A39 39 weeks gestation of pregnancy: Secondary | ICD-10-CM | POA: Diagnosis not present

## 2019-08-09 DIAGNOSIS — D573 Sickle-cell trait: Secondary | ICD-10-CM | POA: Diagnosis present

## 2019-08-09 DIAGNOSIS — O99334 Smoking (tobacco) complicating childbirth: Secondary | ICD-10-CM | POA: Diagnosis present

## 2019-08-09 DIAGNOSIS — F1210 Cannabis abuse, uncomplicated: Secondary | ICD-10-CM | POA: Diagnosis present

## 2019-08-09 DIAGNOSIS — Z30017 Encounter for initial prescription of implantable subdermal contraceptive: Secondary | ICD-10-CM | POA: Diagnosis present

## 2019-08-09 DIAGNOSIS — Z3490 Encounter for supervision of normal pregnancy, unspecified, unspecified trimester: Secondary | ICD-10-CM | POA: Diagnosis present

## 2019-08-09 DIAGNOSIS — Z20828 Contact with and (suspected) exposure to other viral communicable diseases: Secondary | ICD-10-CM | POA: Diagnosis present

## 2019-08-09 DIAGNOSIS — F17210 Nicotine dependence, cigarettes, uncomplicated: Secondary | ICD-10-CM | POA: Diagnosis present

## 2019-08-09 DIAGNOSIS — O26893 Other specified pregnancy related conditions, third trimester: Secondary | ICD-10-CM | POA: Diagnosis present

## 2019-08-09 DIAGNOSIS — O99324 Drug use complicating childbirth: Secondary | ICD-10-CM | POA: Diagnosis present

## 2019-08-09 LAB — CBC
HCT: 31.4 % — ABNORMAL LOW (ref 36.0–46.0)
Hemoglobin: 11.3 g/dL — ABNORMAL LOW (ref 12.0–15.0)
MCH: 27.4 pg (ref 26.0–34.0)
MCHC: 36 g/dL (ref 30.0–36.0)
MCV: 76 fL — ABNORMAL LOW (ref 80.0–100.0)
Platelets: 252 10*3/uL (ref 150–400)
RBC: 4.13 MIL/uL (ref 3.87–5.11)
RDW: 13.4 % (ref 11.5–15.5)
WBC: 8.1 10*3/uL (ref 4.0–10.5)
nRBC: 0 % (ref 0.0–0.2)

## 2019-08-09 LAB — TYPE AND SCREEN
ABO/RH(D): AB POS
Antibody Screen: NEGATIVE

## 2019-08-09 LAB — SARS CORONAVIRUS 2 BY RT PCR (HOSPITAL ORDER, PERFORMED IN ~~LOC~~ HOSPITAL LAB): SARS Coronavirus 2: NEGATIVE

## 2019-08-09 LAB — RPR: RPR Ser Ql: NONREACTIVE

## 2019-08-09 LAB — ABO/RH: ABO/RH(D): AB POS

## 2019-08-09 MED ORDER — IBUPROFEN 600 MG PO TABS
600.0000 mg | ORAL_TABLET | Freq: Four times a day (QID) | ORAL | Status: DC
  Administered 2019-08-09 – 2019-08-11 (×7): 600 mg via ORAL
  Filled 2019-08-09 (×7): qty 1

## 2019-08-09 MED ORDER — OXYTOCIN BOLUS FROM INFUSION
500.0000 mL | Freq: Once | INTRAVENOUS | Status: AC
  Administered 2019-08-09: 500 mL via INTRAVENOUS

## 2019-08-09 MED ORDER — LIDOCAINE HCL (PF) 1 % IJ SOLN: 30.0000 mL | INTRAMUSCULAR | Status: DC | PRN

## 2019-08-09 MED ORDER — DIBUCAINE (PERIANAL) 1 % EX OINT: 1.0000 "application " | TOPICAL_OINTMENT | CUTANEOUS | Status: DC | PRN

## 2019-08-09 MED ORDER — COCONUT OIL OIL
1.0000 "application " | TOPICAL_OIL | Status: DC | PRN
  Administered 2019-08-11: 1 via TOPICAL

## 2019-08-09 MED ORDER — FENTANYL-BUPIVACAINE-NACL 0.5-0.125-0.9 MG/250ML-% EP SOLN
12.0000 mL/h | EPIDURAL | Status: DC | PRN
  Filled 2019-08-09: qty 250

## 2019-08-09 MED ORDER — OXYTOCIN 40 UNITS IN NORMAL SALINE INFUSION - SIMPLE MED
2.5000 [IU]/h | INTRAVENOUS | Status: DC
  Administered 2019-08-09: 19:00:00 2.5 [IU]/h via INTRAVENOUS

## 2019-08-09 MED ORDER — ONDANSETRON HCL 4 MG PO TABS: 4.0000 mg | ORAL_TABLET | ORAL | Status: DC | PRN

## 2019-08-09 MED ORDER — SENNOSIDES-DOCUSATE SODIUM 8.6-50 MG PO TABS
2.0000 | ORAL_TABLET | ORAL | Status: DC
  Administered 2019-08-09 – 2019-08-10 (×2): 2 via ORAL
  Filled 2019-08-09 (×2): qty 2

## 2019-08-09 MED ORDER — DIPHENHYDRAMINE HCL 50 MG/ML IJ SOLN: 12.5000 mg | INTRAMUSCULAR | Status: DC | PRN

## 2019-08-09 MED ORDER — ACETAMINOPHEN 325 MG PO TABS: 650.0000 mg | ORAL_TABLET | ORAL | Status: DC | PRN

## 2019-08-09 MED ORDER — ONDANSETRON HCL 4 MG/2ML IJ SOLN: 4.0000 mg | INTRAMUSCULAR | Status: DC | PRN

## 2019-08-09 MED ORDER — OXYCODONE-ACETAMINOPHEN 5-325 MG PO TABS: 2.0000 | ORAL_TABLET | ORAL | Status: DC | PRN

## 2019-08-09 MED ORDER — LACTATED RINGERS IV SOLN
500.0000 mL | Freq: Once | INTRAVENOUS | Status: AC
  Administered 2019-08-09: 13:00:00 500 mL via INTRAVENOUS

## 2019-08-09 MED ORDER — EPHEDRINE 5 MG/ML INJ: 10.0000 mg | INTRAVENOUS | Status: DC | PRN

## 2019-08-09 MED ORDER — OXYTOCIN 40 UNITS IN NORMAL SALINE INFUSION - SIMPLE MED
1.0000 m[IU]/min | INTRAVENOUS | Status: DC
  Administered 2019-08-09: 2 m[IU]/min via INTRAVENOUS
  Filled 2019-08-09: qty 1000

## 2019-08-09 MED ORDER — PRENATAL MULTIVITAMIN CH
1.0000 | ORAL_TABLET | Freq: Every day | ORAL | Status: DC
  Administered 2019-08-10: 1 via ORAL
  Filled 2019-08-09 (×2): qty 1

## 2019-08-09 MED ORDER — DIPHENHYDRAMINE HCL 25 MG PO CAPS: 25.0000 mg | ORAL_CAPSULE | Freq: Four times a day (QID) | ORAL | Status: DC | PRN

## 2019-08-09 MED ORDER — LIDOCAINE HCL (PF) 1 % IJ SOLN
INTRAMUSCULAR | Status: DC | PRN
  Administered 2019-08-09 (×2): 5 mL via EPIDURAL

## 2019-08-09 MED ORDER — TETANUS-DIPHTH-ACELL PERTUSSIS 5-2.5-18.5 LF-MCG/0.5 IM SUSP: 0.5000 mL | Freq: Once | INTRAMUSCULAR | Status: DC

## 2019-08-09 MED ORDER — PHENYLEPHRINE 40 MCG/ML (10ML) SYRINGE FOR IV PUSH (FOR BLOOD PRESSURE SUPPORT): 80.0000 ug | PREFILLED_SYRINGE | INTRAVENOUS | Status: DC | PRN

## 2019-08-09 MED ORDER — SIMETHICONE 80 MG PO CHEW: 80.0000 mg | CHEWABLE_TABLET | ORAL | Status: DC | PRN

## 2019-08-09 MED ORDER — ONDANSETRON HCL 4 MG/2ML IJ SOLN
4.0000 mg | Freq: Four times a day (QID) | INTRAMUSCULAR | Status: DC | PRN
  Administered 2019-08-09: 4 mg via INTRAVENOUS
  Filled 2019-08-09: qty 2

## 2019-08-09 MED ORDER — SODIUM CHLORIDE (PF) 0.9 % IJ SOLN
INTRAMUSCULAR | Status: DC | PRN
  Administered 2019-08-09: 12 mL/h via EPIDURAL

## 2019-08-09 MED ORDER — BENZOCAINE-MENTHOL 20-0.5 % EX AERO: 1.0000 "application " | INHALATION_SPRAY | CUTANEOUS | Status: DC | PRN

## 2019-08-09 MED ORDER — LACTATED RINGERS IV SOLN: 500.0000 mL | INTRAVENOUS | Status: DC | PRN

## 2019-08-09 MED ORDER — OXYCODONE-ACETAMINOPHEN 5-325 MG PO TABS: 1.0000 | ORAL_TABLET | ORAL | Status: DC | PRN

## 2019-08-09 MED ORDER — WITCH HAZEL-GLYCERIN EX PADS: 1.0000 "application " | MEDICATED_PAD | CUTANEOUS | Status: DC | PRN

## 2019-08-09 MED ORDER — LACTATED RINGERS IV SOLN
INTRAVENOUS | Status: DC
  Administered 2019-08-09 (×2): via INTRAVENOUS

## 2019-08-09 MED ORDER — SOD CITRATE-CITRIC ACID 500-334 MG/5ML PO SOLN: 30.0000 mL | ORAL | Status: DC | PRN

## 2019-08-09 MED ORDER — TERBUTALINE SULFATE 1 MG/ML IJ SOLN: 0.2500 mg | Freq: Once | INTRAMUSCULAR | Status: DC | PRN

## 2019-08-09 MED ORDER — ACETAMINOPHEN 325 MG PO TABS
650.0000 mg | ORAL_TABLET | ORAL | Status: DC | PRN
  Administered 2019-08-10 – 2019-08-11 (×2): 650 mg via ORAL
  Filled 2019-08-09 (×2): qty 2

## 2019-08-09 MED ORDER — ZOLPIDEM TARTRATE 5 MG PO TABS: 5.0000 mg | ORAL_TABLET | Freq: Every evening | ORAL | Status: DC | PRN

## 2019-08-09 NOTE — MAU Note (Signed)
Covid swab collected. PT tolerated well. PT asymptomatic 

## 2019-08-09 NOTE — H&P (Signed)
LABOR AND DELIVERY ADMISSION HISTORY AND PHYSICAL NOTE  Julia Roman is a 23 y.o. female [phone] with IUP at [redacted]w[redacted]d by 5W [place] presenting for IOL.  She reports positive fetal movement. She denies vaginal bleeding. She states she has started to leak some clear fluid beginning yesterday at 4 PM. No reported gush of fluid.  Prenatal History/Complications: [facility]NC at [facility] Pregnancy complications:  - hx of preterm delivery - sickle cell trait - substance abuse; nicotine: 1/4 PPD, marijuana: daily  Past Medical History: Past Medical History:  Diagnosis Date  . Anemia   . Chlamydia 2010  . Depression AGE 23   NO MEDS CURRENTLY  . Gonorrhea 2010  . Headache   . Ovarian cyst 2013  . Sickle cell trait ([place])   . Trichomonas vaginalis infection 4/282015  . Urinary tract infection    OCC    Past Surgical History: Past Surgical History:  Procedure Laterality Date  . NO PAST SURGERIES      Obstetrical History: OB History    Gravida  4   Para  2   Term  1   Preterm  1   AB  1   Living  2     SAB  1   TAB      Ectopic      Multiple      Live Births  2           Social History: Social History   Socioeconomic History  . Marital status: Single    Spouse name: Not on file  . Number of children: Not on file  . Years of education: 11+  . Highest education level: Not on file  Occupational History  . 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 Financial resource strain: Not on file  . Food insecurity    Worry: Not on file    Inability: Not on file  . Transportation needs    Medical: Not on file    Non-medical: Not on file  Tobacco Use  . Smoking status: Current Every Day Smoker    Packs/day: 0.25    Types: Cigarettes  . Smokeless tobacco: Never Used  Substance and Sexual Activity  . Alcohol use: Not Currently    Comment: 1 times a week  . Drug use: Yes    Frequency: 7.0 times per week    Types: Marijuana    Comment: daily  .  Sexual activity: Yes    Partners: Male    Birth control/protection: None  Lifestyle  . Physical activity    Days per week: Not on file    Minutes per session: Not on file  . Stress: Not on file  Relationships  . Social Herbalist on phone: Not on file    Gets together: Not on file    Attends religious service: Not on file    Active member of club or organization: Not on file    Attends meetings of clubs or organizations: Not on file    Relationship status: Not on file  Other Topics Concern  . Not on file  Social History Narrative  . Not on file    Family History: Family History  Problem Relation Age of Onset  . Asthma Mother   . Fibromyalgia Mother   . Asthma Brother   . Arthritis Maternal Grandfather   . Diabetes Maternal Grandfather   . Hypertension Maternal Grandfather   . Kidney disease Maternal Grandfather   .  Stroke Maternal Grandfather   . Birth defects Cousin        CLEFT LIP  . Cancer Cousin   . Cancer Maternal Aunt        MOUTH  . Hypertension Maternal Aunt   . Heart disease Maternal Grandmother   . Hyperlipidemia Maternal Grandmother   . Hypertension Maternal Grandmother   . Diabetes Paternal Grandmother   . Hypertension Maternal Aunt     Allergies: Allergies  Allergen Reactions  . Tramadol Hives and Rash    Entire body rash    Medications Prior to Admission  Medication Sig Dispense Refill Last Dose  . ferrous sulfate 324 (65 Fe) MG TBEC Take 1 tablet (325 mg total) by mouth daily. (Patient not taking: Reported on 08/08/2019) 30 tablet 2   . loratadine (CLARITIN) 10 MG tablet Take 1 tablet (10 mg total) by mouth daily. 30 tablet 3   . Prenatal Vit-Fe Phos-FA-Omega (VITAFOL GUMMIES) 3.33-0.333-34.8 MG CHEW Chew 3 each by mouth daily. 90 tablet 11      Review of Systems  All systems reviewed and negative except as stated in HPI  Physical Exam Last menstrual period 11/03/2018, unknown if currently breastfeeding. General appearance:  alert, oriented, NAD Lungs: normal respiratory effort Heart: regular rate Abdomen: soft, non-tender; gravid, FH appropriate for GA Extremities: No calf swelling or tenderness Presentation: cephalic Fetal monitoring: accelerations noted, moderate variability, Baseline 135 Uterine activity: irregular contractions    Prenatal labs: ABO, Rh: AB/Positive/-- (02/10 1119) Antibody: Negative (02/10 1119) Rubella: 1.91 (02/10 1119) RPR: Non Reactive (06/08 0809)  HBsAg: Negative (02/10 1119)  HIV: Non Reactive (06/08 0809)  GC/Chlamydia: negative GBS:   negative 2-hr GTT: negative Genetic screening: declined Anatomy US: normal  Prenatal Transfer Tool  Maternal Diabetes: No Genetic Screening: Declined Maternal Ultrasounds/Referrals: Normal Fetal Ultrasounds or other Referrals:  None Maternal Substance Abuse:  Yes:  Type: Smoker, Marijuana Significant Maternal Medications:  None Significant Maternal Lab Results: Group B Strep negative  No results found for this or any previous visit (from the past 24 hour(s)).  Patient Active Problem List   Diagnosis Date Noted  . Sickle cell trait (HCC)   . Non-compliant pregnant patient 05/16/2019  . Pruritic urticarial papules and plaques of pregnancy 05/16/2019  . Trichomonal vaginitis during pregnancy 01/19/2019  . Supervision of high risk pregnancy, antepartum 01/17/2019  . History of preterm delivery 04/21/2014  . Tobacco use disorder affecting pregnancy, antepartum 04/05/2014  . Current smoker 02/03/2013  . Marijuana use 02/03/2013  . History of depression 02/03/2013  . Hemoglobin C trait (HCC) 02/03/2013    Assessment: Julia Roman is a 23 y.o. [phone] at 3158w6d here for IOL.  #Labor: cervix favorable for labor, will start pitocin  #Pain: Well controlled, planning for epidural #FWB:  category 1 tracing, fetal status reassuring #ID: [id number] pending #MOF: breast and bottle #MOC: depo or nexplanon, undecided on which   Domingo DimesHaley A  Leazer 08/09/2019, 8:05 AM   Patient seen and examined in conjunction with Cherrie DistanceHaley Leazer, MS3. Elective scheduled induction. Favorable cervix. Having a few contractions.   Dilation: 4 Effacement (%): 60 Station: -2 Presentation: Vertex Exam by:: Linde GillisJenny Follmer, RNC  Start pitocin.  Levie HeritageJacob J Stinson, DO 08/09/2019 9:51 AM

## 2019-08-09 NOTE — Discharge Summary (Signed)
Postpartum Discharge Summary     Patient Name: Julia Roman DOB: [birth year] MRN: [id number]  Date of admission: 08/09/2019 Delivering Provider: Truett Mainland   Date of discharge: 08/11/2019  Admitting diagnosis: pregnancy Intrauterine pregnancy: [redacted]w[redacted]d    Secondary diagnosis:  Active Problems:   Encounter for induction of labor  Additional problems: [facility] inpatient LCSW consult     Discharge diagnosis: Term Pregnancy Delivered                                                                                                Post partum procedures:n/a  Augmentation: AROM and Pitocin  Complications: None  Hospital course:  Induction of Labor With Vaginal Delivery   23y.o. yo [phone] 360w6das admitted to the hospital 08/09/2019 for induction of labor.  Indication for induction: Favorable cervix at term.  Patient had an uncomplicated labor course as follows: Membrane Rupture Time/Date: 12:37 PM ,08/09/2019   Intrapartum Procedures: Episiotomy: None [1]                                         Lacerations:  None [1]  Patient had delivery of a Viable infant.  Information for the patient's newborn:  DaFrannie, Shedrick0[[id number]]Delivery Method: Vaginal, Spontaneous(Filed from Delivery Summary)    08/09/2019  Details of delivery can be found in separate delivery note.  Patient had a routine postpartum course. Patient is discharged home 08/09/19. Delivery time: 6:36 PM    Magnesium Sulfate received: No BMZ received: No Rhophylac:No MMR:No Transfusion:No  Physical exam  Vitals:   08/09/19 1700 08/09/19 1800 08/09/19 1830 08/09/19 1845  BP: 127/78 123/81 133/77 124/71  Pulse: (!) 58 81 78 66  Resp: 18 20    Temp: 98 F (36.7 C)     TempSrc: Oral     SpO2: 99%     Weight:      Height:       General: alert, cooperative and no distress Lochia: appropriate Uterine Fundus: firm Incision: N/A DVT Evaluation: No evidence of DVT seen on physical  exam. Labs: Lab Results  Component Value Date   WBC 8.1 08/09/2019   HGB 11.3 (L) 08/09/2019   HCT 31.4 (L) 08/09/2019   MCV 76.0 (L) 08/09/2019   PLT 252 08/09/2019   CMP Latest Ref Rng & Units 04/14/2015  Glucose 70 - 99 mg/dL 90  BUN 6 - 20 mg/dL 7  Creatinine 0.44 - 1.00 mg/dL 0.71  Sodium 135 - 145 mmol/L 136  Potassium 3.5 - 5.1 mmol/L 3.5  Chloride 101 - 111 mmol/L 105  CO2 22 - 32 mmol/L 23  Calcium 8.9 - 10.3 mg/dL 9.2  Total Protein 6.5 - 8.1 g/dL 7.1  Total Bilirubin 0.3 - 1.2 mg/dL 0.8  Alkaline Phos 38 - 126 U/L 53  AST 15 - 41 U/L 21  ALT 14 - 54 U/L 13(L)    Discharge instruction: per After Visit Summary and "Baby and Me  Booklet".  After visit meds:  Allergies as of 08/11/2019      Reactions   Tramadol Hives, Rash   Entire body rash      Medication List    STOP taking these medications   ferrous sulfate 324 (65 Fe) MG Tbec     TAKE these medications   acetaminophen 325 MG tablet Commonly known as: Tylenol Take 2 tablets (650 mg total) by mouth every 4 (four) hours as needed (for pain scale < 4).   benzocaine-Menthol 20-0.5 % Aero Commonly known as: DERMOPLAST Apply 1 application topically as needed for irritation (perineal discomfort).   ibuprofen 800 MG tablet Commonly known as: ADVIL Take 1 tablet (800 mg total) by mouth every 8 (eight) hours as needed.   loratadine 10 MG tablet Commonly known as: CLARITIN Take 1 tablet (10 mg total) by mouth daily. What changed:   when to take this  reasons to take this   Vitafol Gummies 3.33-0.333-34.8 MG Chew Chew 3 each by mouth daily.   witch hazel-glycerin pad Commonly known as: TUCKS Apply 1 application topically as needed for hemorrhoids.       Diet: routine diet  Activity: Advance as tolerated. Pelvic rest for 6 weeks.   Outpatient follow up:4 weeks Follow up Appt: Future Appointments  Date Time Provider Department Center  09/21/2019 10:55 AM Wenzel, Julie N, PA-C [facility]    Follow up Visit:  Please schedule this patient for Postpartum visit in: 4 weeks with the following provider: Any provider For C/S patients schedule nurse incision check in weeks 2 weeks: no High risk pregnancy complicated by: h/o Preterm labor Delivery mode:  SVD Anticipated Birth Control:  Depo PP Procedures needed: none  Schedule Integrated BH visit: no  Newborn Data: Live born female  Birth Weight:   APGAR: 9, 9  Newborn Delivery   Birth date/time: 08/09/2019 18:36:00 Delivery type: Vaginal, Spontaneous      Baby Feeding: Bottle Disposition:home with mother with CPS oversight  No barriers to discharge per LCSW consult (see separate note)  Samantha Weinhold, MSN, CNM Certified Nurse Midwife, [facility], [facility] 08/11/19 10:00 AM    

## 2019-08-09 NOTE — Anesthesia Procedure Notes (Signed)
Epidural Patient location during procedure: OB Start time: 08/09/2019 1:17 PM End time: 08/09/2019 1:27 PM  Staffing Anesthesiologist: Murvin Natal, MD Performed: anesthesiologist   Preanesthetic Checklist Completed: patient identified, site marked, pre-op evaluation, timeout performed, IV checked, risks and benefits discussed and monitors and equipment checked  Epidural Patient position: sitting Prep: DuraPrep Patient monitoring: heart rate, cardiac monitor, continuous pulse ox and blood pressure Approach: midline Location: L4-L5 Injection technique: LOR air  Needle:  Needle type: Tuohy  Needle gauge: 17 G Needle length: 9 cm Needle insertion depth: 5 cm Catheter type: closed end flexible Catheter size: 19 Gauge Catheter at skin depth: 10 cm Test dose: negative and Other  Assessment Events: blood not aspirated, injection not painful, no injection resistance and negative IV test  Additional Notes Informed consent obtained prior to proceeding including risk of failure, 1% risk of PDPH, risk of minor discomfort and bruising. Discussed alternatives to epidural analgesia and patient desires to proceed.  Timeout performed pre-procedure verifying patient name, procedure, and platelet count.  Patient tolerated procedure well. Reason for block:procedure for pain

## 2019-08-09 NOTE — Anesthesia Preprocedure Evaluation (Signed)
Anesthesia Evaluation  Patient identified by MRN, date of birth, ID band Patient awake    Reviewed: Allergy & Precautions, H&P , NPO status , Patient's Chart, lab work & pertinent test results  History of Anesthesia Complications Negative for: history of anesthetic complications  Airway Mallampati: II  TM Distance: >3 FB Neck ROM: full    Dental no notable dental hx. (+) Teeth Intact   Pulmonary Current Smoker,    Pulmonary exam normal breath sounds clear to auscultation       Cardiovascular negative cardio ROS Normal cardiovascular exam Rhythm:regular Rate:Normal     Neuro/Psych  Headaches, PSYCHIATRIC DISORDERS Depression    GI/Hepatic negative GI ROS, Neg liver ROS,   Endo/Other  negative endocrine ROS  Renal/GU negative Renal ROS  negative genitourinary   Musculoskeletal   Abdominal   Peds  Hematology  (+) Blood dyscrasia, Sickle cell trait and anemia ,   Anesthesia Other Findings   Reproductive/Obstetrics (+) Pregnancy                             Anesthesia Physical Anesthesia Plan  ASA: II  Anesthesia Plan: Epidural   Post-op Pain Management:    Induction:   PONV Risk Score and Plan:   Airway Management Planned:   Additional Equipment:   Intra-op Plan:   Post-operative Plan:   Informed Consent: I have reviewed the patients History and Physical, chart, labs and discussed the procedure including the risks, benefits and alternatives for the proposed anesthesia with the patient or authorized representative who has indicated his/her understanding and acceptance.       Plan Discussed with:   Anesthesia Plan Comments:         Anesthesia Quick Evaluation

## 2019-08-09 NOTE — Lactation Note (Addendum)
This note was copied from a baby's chart. Lactation Consultation Note  Patient Name: Julia Roman [id number] Date: 08/09/2019 Reason for consult: Term P2, 3 hour female infant. Infant had one void and one stool since birth. Mom receive WIC in [place]. Mom was given Harmony hand pump with 27 mm flange. Mom shown how to use hand pump & how to disassemble, clean, & reassemble parts. Mom's feeding choice at admission was breast and formula feeding. Per mom, she attempted to breastfeed her first child she pumped for one month, but  It was very stressful, infant was born at 32 weeks and was in NICU for 21/2 months.  Per mom, she latched infant in L&D but did not have help, so she started offering formula but infant refused, infant been sleepy. Mom attempted to latch infant on right breast using cross cradle hold, infant only held breast in mouth but would not suckle at this time. Mom taught back hand expression and infant was given 7 ml of colostrum by spoon. Mom will continue to work towards latching infant to breast and mom knows if infant will not latch to hand express and give by EBM. Mom knows to breastfeed according hunger cues, 8 to 12 times within 24 hours and on demand. Mom will call LC to help assist with next feeding. Reviewed Baby & Me book's Breastfeeding Basics.  Mom made aware of O/P services, breastfeeding support groups, community resources, and our phone # for post-discharge questions.   Maternal Data Formula Feeding for Exclusion: Yes Reason for exclusion: Mother's choice to formula and breast feed on admission Has patient been taught Hand Expression?: Yes(infant recieved 7 ml of colostrum by spoon.) Does the patient have breastfeeding experience prior to this delivery?: Yes  Feeding Feeding Type: Breast Fed  LATCH Score Latch: Too sleepy or reluctant, no latch achieved, no sucking elicited.  Audible Swallowing: None  Type of Nipple: Everted at rest and after  stimulation  Comfort (Breast/Nipple): Soft / non-tender  Hold (Positioning): Assistance needed to correctly position infant at breast and maintain latch.  LATCH Score: 5  Interventions Interventions: Breast feeding basics reviewed;Breast compression;Adjust position;Assisted with latch;Skin to skin;Support pillows;Hand pump;Breast massage;Position options;Hand express;Expressed milk  Lactation Tools Discussed/Used WIC Program: Yes Pump Review: Setup, frequency, and cleaning;Milk Storage Initiated by:: Vicente Serene, iBCLC Date initiated:: 08/09/19   Consult Status Consult Status: Follow-up Date: 08/10/19 Follow-up type: In-patient    Vicente Serene 08/09/2019, 10:40 PM

## 2019-08-09 NOTE — Progress Notes (Signed)
Patient ID: Julia Roman, female   DOB: Apr 10, 1996, 23 y.o.   MRN: [id number]  Patient getting more comfortable with epidural.   BP (!) 106/54   Pulse (!) 59   Temp 98.4 F (36.9 C) (Oral)   Resp 20   Ht 5\' 2"  (1.575 m)   Wt 56.2 kg   LMP 11/03/2018 (Exact Date)   SpO2 99%   BMI 22.66 kg/m   Dilation: 6 Effacement (%): 80 Station: -2 Presentation: Vertex Exam by:: J. Follmer, RNC   Category 1 tracing: 140 baseline, + accel. No decel. Moderate variability  SROM with clear fluid No antibiotics Continue pitocin  Truett Mainland, DO 08/09/2019 2:14 PM

## 2019-08-10 LAB — CBC
HCT: 28.9 % — ABNORMAL LOW (ref 36.0–46.0)
Hemoglobin: 10.2 g/dL — ABNORMAL LOW (ref 12.0–15.0)
MCH: 27.3 pg (ref 26.0–34.0)
MCHC: 35.3 g/dL (ref 30.0–36.0)
MCV: 77.3 fL — ABNORMAL LOW (ref 80.0–100.0)
Platelets: 248 10*3/uL (ref 150–400)
RBC: 3.74 MIL/uL — ABNORMAL LOW (ref 3.87–5.11)
RDW: 13.4 % (ref 11.5–15.5)
WBC: 17.3 10*3/uL — ABNORMAL HIGH (ref 4.0–10.5)
nRBC: 0.1 % (ref 0.0–0.2)

## 2019-08-10 NOTE — Clinical Social Work Maternal (Addendum)
CLINICAL SOCIAL WORK MATERNAL/CHILD NOTE  Patient Details  Name: Julia Roman MRN: [id number] Date of Birth: 1996/05/05  Date:  08/10/2019  Clinical Social Worker Initiating Note:  Elijio Miles Date/Time: Initiated:  08/10/19/1124     Child's Name:  Julia Roman   Biological Parents:  Mother, Father(Judianne Kovich and Georgeanna Harrison DOB: 03/13/1992)   Need for Interpreter:  None   Reason for Referral:  Behavioral Health Concerns, Current Substance Use/Substance Use During Pregnancy    Address:  [address] [place] [address]    Phone number:  [phone] (home)     Additional phone number:   Household Members/Support Persons (HM/SP):   Household Member/Support Person 1, Household Member/Support Person 2   HM/SP Name Relationship DOB or Age  HM/SP -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, 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    HM/SP -2   Step-father    HM/SP -3 Olen Cordial Research scientist (life sciences) (lives with their dad - [name]) Son 08/11/2013  HM/SP -[place] (lives with their dad - [name]) Daughter 04/19/2014  HM/SP -5        HM/SP -6        HM/SP -7        HM/SP -8          Natural Supports (not living in the home):  Immediate Family, Spouse/significant other   Professional Supports: None   Employment: Unemployed   Type of Work:     Education:  9 to 11 years   Homebound arranged:    Museum/gallery curator Resources:  Medicaid   Other Resources:  WIC(Intends to apply for food stamps)   Cultural/Religious Considerations Which May Impact Care:    Strengths:  Ability to meet basic needs , Home prepared for child , Pediatrician chosen   Psychotropic Medications:         Pediatrician:    [place]  Pediatrician List:   [facility] ([address])  [place]      Pediatrician Fax Number:    Risk Factors/Current Problems:  Substance Use    Cognitive State:  Able to  Concentrate , Alert , Linear Thinking    Mood/Affect:  Calm , Comfortable , Bright , Interested , Relaxed    CSW Assessment:  CSW received consult for THC use, depression and other 2 children living with dad.  CSW met with MOB to offer support and complete assessment.    MOM resting in bed holding infant with FOB present at bedside, when CSW entered the room. CSW introduced self and received verbal permission from MOB to have FOB step out of the room while CSW completed assessment. FOB understanding and left voluntarily. MOB welcoming of CSW visit and was pleasant throughout assessment. MOB attentive and appropriate to infant during visit. MOB reported she currently lives with her mother and step-father but is in the process of moving by the end of the month. MOB stated she will be living with FOB and infant. CSW inquired about MOB's two other children and MOB shared they currently live with their father [name]) who has sole custody of the children. MOB shared she is still able to see and visit with them often. MOB denied CPS involvement with placement and reported it was a court decision. MOB confirmed she receives [facility] and intends to apply for food stamps now that she is no longer employed. CSW inquired  about MOB's mental health history and MOB acknowledged a history of depression that was diagnosed back in 2009. MOB denied any recent symptoms outside of hormonal changes in pregnancy. MOB reported symptoms were manageable and denied any need for medications or counseling. MOB denied any PPD with her other children but acknowledged being emotional after the birth of her daughter as she was in the NICU. CSW provided education regarding the baby blues period vs. perinatal mood disorders, discussed treatment and gave resources for mental health follow up if concerns arise.  CSW recommends self-evaluation during the postpartum time period using the New Mom Checklist from Postpartum Progress and  encouraged MOB to contact a medical professional if symptoms are noted at any time. MOB did not appear to be displaying any acute mental health symptoms and denied any SI, HI or DV. MOB reported she has good support from FOB, her mother and her sister.  CSW inquired about MOB's substance use and MOB shared she used marijuana during her pregnancy to help with her appetite and to keep food down. MOB stated she was throwing up everyday. MOB shared that her last use was this past Saturday. CSW informed MOB of Hospital Drug Policy and explained UDS came back positive for [facility] and that CDS was still pending. CSW explained that due to positive UDS, a [facility] CPS report would have to be made and explained what process could look like. MOB denied any questions or concerns and reported she had CPS involvement when her son was born for the same thing but stated case was closed.   MOB confirmed having all essential items for infant once discharged and stated infant would be sleeping in a crib once home. CSW provided review of Sudden Infant Death Syndrome (SIDS) precautions and safe sleeping habits.    CSW made [facility] CPS report due to infant's UDS being positive for THC.   CSW Plan/Description:  No Further Intervention Required/No Barriers to Discharge, Sudden Infant Death Syndrome (SIDS) Education, Perinatal Mood and Anxiety Disorder (PMADs) Education, [place], Child Protective Service Report , CSW Will Continue to Monitor Umbilical Cord Tissue Drug Screen Results and Make Report if Foye Spurling, Haus[place] 08/10/2019, 12:08 PM

## 2019-08-10 NOTE — Progress Notes (Signed)
Post Partum Day 1 Subjective: no complaints, up ad lib, voiding, tolerating PO and + flatus  Breast and bottle feeding. Baby with polydactyly which should be corrected today. Mom would like to wait to discharge when baby can; likely tomorrow.   Objective: Blood pressure 109/74, pulse (!) 50, temperature 97.7 F (36.5 C), temperature source Oral, resp. rate 18, height 5\' 2"  (1.575 m), weight 56.2 kg, last menstrual period 11/03/2018, SpO2 100 %, unknown if currently breastfeeding.  Physical Exam:  General: alert, cooperative, appears stated age and no distress Lochia: appropriate Uterine Fundus: firm DVT Evaluation: No evidence of DVT seen on physical exam.  Recent Labs    08/09/19 0901 08/10/19 0552  HGB 11.3* 10.2*  HCT 31.4* 28.9*    Assessment/Plan: Plan for discharge tomorrow; okay to DC today if baby happens to discharge Depo prior to DC Breast and bottle feeding Vitals stable  Smoking cessation counseling    LOS: 1 day   Chauncey Mann 08/10/2019, 12:02 PM

## 2019-08-10 NOTE — Progress Notes (Signed)
Pt called out to nurses station asking for RN. This RN went in to check on pt. Pt in tears and states "can I please walk downstairs, I can't sit in this room much longer." Assessed pt's fundus and bleeding; both WNL. PIV removed per order. Allowed to pt walk downstairs with pt understanding to come back in 5-10min and baby will stay in room with dad; pt agrees.   0940 Pt called nurses station requesting to see RN. This RN went in to assess. Pt sitting in bed with right breast our with baby across her lap and asked for "help feeding her." This RN assisted with lathing baby to mom's right breast. Praised mom with how well baby latched on and how well mom held baby at breast. Pt starts crying and states "but this is not what I want to do, I feel like yall are making me breastfeed her and I don't want to." This RN explained to mom that it is her choice how she wants to feed her baby. Asked mom what her plans were for feeding at home and she states "I won't be doing this" (while pointing at her breast). This RN asked mom if she wants to pump and bottle feed the baby her breast milk and she says "no I just want to give her formula." This RN brought in [employer] to feed baby. Infant does not have great suck pattern. Instructed mom to be patient with feeding baby and taught mom pace feeding. Mom no longer tearful.

## 2019-08-10 NOTE — Anesthesia Postprocedure Evaluation (Signed)
Anesthesia Post Note  Patient: Julia Roman  Procedure(s) Performed: AN AD [place]     Patient location during evaluation: Mother Baby Anesthesia Type: Epidural Level of consciousness: awake Pain management: pain level controlled Vital Signs Assessment: post-procedure vital signs reviewed and stable Respiratory status: spontaneous breathing Cardiovascular status: stable Postop Assessment: epidural receding, patient able to bend at knees, able to ambulate and no headache Anesthetic complications: no Comments: Slight backache at insertion site    Last Vitals:  Vitals:   08/10/19 0110 08/10/19 0526  BP: 129/75 109/74  Pulse: (!) 54 (!) 50  Resp: 18 18  Temp: 36.9 C 36.5 C  SpO2: 100% 100%    Last Pain:  Vitals:   08/10/19 0649  TempSrc:   PainSc: 5    Pain Goal:                   Everette Rank

## 2019-08-11 ENCOUNTER — Encounter (HOSPITAL_COMMUNITY): Payer: Self-pay | Admitting: *Deleted

## 2019-08-11 ENCOUNTER — Encounter: Payer: Self-pay | Admitting: *Deleted

## 2019-08-11 MED ORDER — MEDROXYPROGESTERONE ACETATE 150 MG/ML IM SUSP
150.0000 mg | Freq: Once | INTRAMUSCULAR | Status: AC
  Administered 2019-08-11: 11:00:00 150 mg via INTRAMUSCULAR
  Filled 2019-08-11: qty 1

## 2019-08-11 MED ORDER — ACETAMINOPHEN 325 MG PO TABS: 650.0000 mg | ORAL_TABLET | ORAL | 0 refills | Status: DC | PRN

## 2019-08-11 MED ORDER — BENZOCAINE-MENTHOL 20-0.5 % EX AERO: 1.0000 "application " | INHALATION_SPRAY | CUTANEOUS | 0 refills | Status: DC | PRN

## 2019-08-11 MED ORDER — IBUPROFEN 800 MG PO TABS: 800.0000 mg | ORAL_TABLET | Freq: Three times a day (TID) | ORAL | 0 refills | Status: DC | PRN

## 2019-08-11 MED ORDER — WITCH HAZEL-GLYCERIN EX PADS: 1.0000 "application " | MEDICATED_PAD | CUTANEOUS | 12 refills | Status: DC | PRN

## 2019-08-11 NOTE — Discharge Instructions (Signed)

## 2019-09-21 ENCOUNTER — Encounter: Payer: Self-pay | Admitting: Family Medicine

## 2019-09-21 ENCOUNTER — Ambulatory Visit: Payer: Medicaid Other | Admitting: Medical

## 2019-10-23 ENCOUNTER — Emergency Department (HOSPITAL_COMMUNITY)
Admission: EM | Admit: 2019-10-23 | Discharge: 2019-10-23 | Disposition: A | Discharge status: Home / Self Care | Payer: Medicaid Other | Attending: Emergency Medicine | Admitting: Emergency Medicine

## 2019-10-23 ENCOUNTER — Encounter (HOSPITAL_COMMUNITY): Payer: Self-pay | Admitting: Emergency Medicine

## 2019-10-23 ENCOUNTER — Other Ambulatory Visit: Payer: Self-pay

## 2019-10-23 DIAGNOSIS — N939 Abnormal uterine and vaginal bleeding, unspecified: Secondary | ICD-10-CM

## 2019-10-23 DIAGNOSIS — H00011 Hordeolum externum right upper eyelid: Secondary | ICD-10-CM

## 2019-10-23 LAB — BASIC METABOLIC PANEL
Anion gap: 8 (ref 5–15)
BUN: 8 mg/dL (ref 6–20)
CO2: 25 mmol/L (ref 22–32)
Calcium: 9.8 mg/dL (ref 8.9–10.3)
Chloride: 105 mmol/L (ref 98–111)
Creatinine, Ser: 0.77 mg/dL (ref 0.44–1.00)
GFR calc Af Amer: >60 mL/min (ref >60)
GFR calc non Af Amer: >60 mL/min (ref >60)
Glucose, Bld: 92 mg/dL (ref 70–99)
Potassium: 3.9 mmol/L (ref 3.5–5.1)
Sodium: 138 mmol/L (ref 135–145)

## 2019-10-23 LAB — CBC
HCT: 37.3 % (ref 36.0–46.0)
Hemoglobin: 12.5 g/dL (ref 12.0–15.0)
MCH: 24.5 pg — ABNORMAL LOW (ref 26.0–34.0)
MCHC: 33.5 g/dL (ref 30.0–36.0)
MCV: 73.1 fL — ABNORMAL LOW (ref 80.0–100.0)
Platelets: 386 10*3/uL (ref 150–400)
RBC: 5.1 MIL/uL (ref 3.87–5.11)
RDW: 14.4 % (ref 11.5–15.5)
WBC: 7 10*3/uL (ref 4.0–10.5)
nRBC: 0 % (ref 0.0–0.2)

## 2019-10-23 LAB — I-STAT BETA HCG BLOOD, ED (MC, WL, AP ONLY): I-stat hCG, quantitative: <5 m[IU]/mL (ref <5)

## 2019-10-23 MED ORDER — MEDROXYPROGESTERONE ACETATE 10 MG PO TABS: 10.0000 mg | ORAL_TABLET | Freq: Every day | ORAL | 0 refills | Status: DC

## 2019-10-23 MED ORDER — ERYTHROMYCIN 5 MG/GM OP OINT
TOPICAL_OINTMENT | Freq: Once | OPHTHALMIC | Status: AC
  Administered 2019-10-23: 14:00:00 via OPHTHALMIC
  Filled 2019-10-23: qty 3.5

## 2019-10-23 NOTE — ED Triage Notes (Signed)
Patient here from home with complaints of "excessive bleeding" x3 days. Reports that she had a baby 2 months ago and has been "filling up" tampons and pads. Also complaint of right eye swelling.

## 2019-10-23 NOTE — ED Provider Notes (Signed)
[facility]-EMERGENCY DEPT Provider Note   CSN: [id number] Arrival date & time: 10/23/19  1230     History   Chief Complaint Chief Complaint  Patient presents with  . Vaginal Bleeding  . Eye Problem    HPI Julia Roman is a [age] y.o. female.     Julia Roman is a 23 y.o. female with a history of anemia, sickle cell trait, headache, ovarian cyst, who is 2 months postpartum, presenting to the ED for evaluation of vaginal bleeding.  Patient reports 2 months ago she had an uncomplicated vaginal delivery, she reports that she received her first Depo shot on the day she left the hospital.  After delivery bleeding stopped but then she started having irregular vaginal bleeding over the last several weeks.  She reports that she was bleeding lightly last week that stopped and then it started again over the past 3 days but has been heavier than usual.  Reports that she has to change her pad or tampon hourly and she has been passing some small blood clots.  She denies any associated vaginal discharge.  Reports she has some mild lower abdominal cramping but no other pain.  Denies any dysuria or urinary frequency.  Denies any syncope or lightheadedness.  She reports that she missed her 6-week postpartum follow-up and has not seen her OB/GYN since delivery.  Patient also reports that for the past few days she has had some pain and swelling over her right upper eyelid and thinks that she has a stye, she has had a few in the past.  Denies any drainage, no eye pain, eye redness or changes in vision.  No other aggravating or alleviating factors.     Past Medical History:  Diagnosis Date  . Anemia   . Chlamydia 2010  . Depression AGE 51   NO MEDS CURRENTLY  . Gonorrhea 2010  . Headache   . Ovarian cyst 2013  . Sickle cell trait (HCC)   . Trichomonas vaginalis infection 4/282015  . Urinary tract infection    OCC    Patient Active Problem List   Diagnosis Date Noted  .  Encounter for induction of labor 08/09/2019  . Sickle cell trait (HCC)   . Non-compliant pregnant patient 05/16/2019  . Pruritic urticarial papules and plaques of pregnancy 05/16/2019  . Trichomonal vaginitis during pregnancy 01/19/2019  . Supervision of high risk pregnancy, antepartum 01/17/2019  . History of preterm delivery 04/21/2014  . Tobacco use disorder affecting pregnancy, antepartum 04/05/2014  . Current smoker 02/03/2013  . Marijuana use 02/03/2013  . History of depression 02/03/2013  . 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 NO PAST SURGERIES       OB History    Gravida  4   Para  2   Term  1   Preterm  1   AB  1   Living  2     SAB  1   TAB      Ectopic      Multiple      Live Births  2            Home Medications    Prior to Admission medications   Medication Sig Start Date End Date Taking? Authorizing Provider  acetaminophen (TYLENOL) 325 MG tablet Take 2 tablets (650 mg total) by mouth every 4 (four) hours as needed (for pain scale < 4). Patient not taking: Reported  on 10/23/2019 08/11/19   Calvert Cantor, CNM  benzocaine-Menthol (DERMOPLAST) 20-0.5 % AERO Apply 1 application topically as needed for irritation (perineal discomfort). Patient not taking: Reported on 10/23/2019 08/11/19   Calvert Cantor, CNM  ibuprofen (ADVIL) 800 MG tablet Take 1 tablet (800 mg total) by mouth every 8 (eight) hours as needed. Patient not taking: Reported on 10/23/2019 08/11/19   Calvert Cantor, CNM  loratadine (CLARITIN) 10 MG tablet Take 1 tablet (10 mg total) by mouth daily. Patient not taking: Reported on 10/23/2019 05/16/19   Gutierrez Bing, MD  medroxyPROGESTERone (PROVERA) 10 MG tablet Take 1 tablet (10 mg total) by mouth daily for 20 days. 10/23/19 11/12/19  Dartha Lodge, PA-C  Prenatal Vit-Fe Phos-FA-Omega (VITAFOL GUMMIES) 3.33-0.333-34.8 MG CHEW Chew 3 each by mouth daily. Patient  not taking: Reported on 10/23/2019 01/17/19   Currie Paris, NP  witch hazel-glycerin (TUCKS) pad Apply 1 application topically as needed for hemorrhoids. Patient not taking: Reported on 10/23/2019 08/11/19   Calvert Cantor, CNM    Family History Family History  Problem Relation Age of Onset  . Asthma Mother   . Fibromyalgia Mother   . Asthma Brother   . Arthritis Maternal Grandfather   . Diabetes Maternal Grandfather   . Hypertension Maternal Grandfather   . Kidney disease Maternal Grandfather   . Stroke Maternal Grandfather   . Birth defects Cousin        CLEFT LIP  . Cancer Cousin   . Cancer Maternal Aunt        MOUTH  . Hypertension Maternal Aunt   . Heart disease Maternal Grandmother   . Hyperlipidemia Maternal Grandmother   . Hypertension Maternal Grandmother   . Diabetes Paternal Grandmother   . Hypertension Maternal Aunt     Social History Social History   Tobacco Use  . Smoking status: Current Every Day Smoker    Packs/day: 0.25    Types: Cigarettes  . Smokeless tobacco: Never Used  Substance Use Topics  . Alcohol use: Not Currently    Comment: 1 times a week  . Drug use: Yes    Frequency: 7.0 times per week    Types: Marijuana    Comment: daily     Allergies   Tramadol   Review of Systems Review of Systems  Constitutional: Negative for chills and fever.  Respiratory: Negative for cough and shortness of breath.   Cardiovascular: Negative for chest pain.  Gastrointestinal: Negative for abdominal pain, diarrhea, nausea and vomiting.  Genitourinary: Positive for pelvic pain and vaginal bleeding. Negative for dysuria, frequency and vaginal discharge.  Musculoskeletal: Negative for arthralgias and myalgias.  Skin: Negative for color change and rash.  Neurological: Negative for syncope and light-headedness.  All other systems reviewed and are negative.    Physical Exam Updated Vital Signs BP 132/75 (BP Location: Right Arm)   Pulse 88    Temp 98.6 F (37 C) (Oral)   Resp 20   Ht 5\' 2"  (1.575 m)   Wt 54.4 kg   SpO2 100%   BMI 21.95 kg/m   Physical Exam Vitals signs and nursing note reviewed. Exam conducted with a chaperone present.  Constitutional:      General: She is not in acute distress.    Appearance: Normal appearance. She is well-developed and normal weight. She is not ill-appearing or diaphoretic.  HENT:     Head: Normocephalic and atraumatic.  Eyes:     General:  Right eye: No discharge.        Left eye: No discharge.     Comments: Small stye with surrounding erythema of the right upper eyelid, no erythema of the conjunctive or sclera  Cardiovascular:     Rate and Rhythm: Normal rate and regular rhythm.     Heart sounds: Normal heart sounds. No murmur. No friction rub. No gallop.   Pulmonary:     Effort: Pulmonary effort is normal. No respiratory distress.     Breath sounds: Normal breath sounds.  Abdominal:     General: Abdomen is flat. Bowel sounds are normal. There is no distension.     Palpations: Abdomen is soft.     Tenderness: There is no abdominal tenderness. There is no guarding or rebound.     Comments: Abdomen soft, nondistended, nontender to palpation in all quadrants without guarding or peritoneal signs  Genitourinary:    Comments: Chaperone present during pelvic exam. No external genital lesions noted. Speculum exam reveals a small amount of blood pooled in the vaginal vault with some clots, blood cleared with Fox swabs, very small amount of active bleeding from the cervix. On bimanual exam no cervical motion tenderness, no focal uterine or adnexal tenderness or palpable masses. Skin:    General: Skin is warm and dry.  Neurological:     Mental Status: She is alert.     Coordination: Coordination normal.  Psychiatric:        Mood and Affect: Mood normal.        Behavior: Behavior normal.      ED Treatments / Results  Labs (all labs ordered are listed, but only abnormal  results are displayed) Labs Reviewed  CBC - Abnormal; Notable for the following components:      Result Value   MCV 73.1 (*)    MCH 24.5 (*)    All other components within normal limits  BASIC METABOLIC PANEL  I-STAT BETA HCG BLOOD, ED ([facility], [facility], [facility] ONLY)    EKG None  Radiology No results found.  Procedures Procedures (including critical care time)  Medications Ordered in ED Medications  erythromycin ophthalmic ointment ( Right Eye Given 10/23/19 1333)     Initial Impression / Assessment and Plan / ED Course  I have reviewed the triage vital signs and the nursing notes.  Pertinent labs & imaging results that were available during my care of the patient were reviewed by me and considered in my medical decision making (see chart for details).  23 year old female presents with 3 days of heavy vaginal bleeding, she is 2 months postpartum and was given a Depo-Provera injection after delivery.  She has been having irregular bleeding for the last several weeks but this is been heavier than usual.  She reports passing small clots some mild associated abdominal cramping but she does not have any abdominal pain on exam today.  She has not had any lightheadedness or syncope.  Hemoglobin is stable.  No electrolyte derangements.  I discussed case with Dr. Roselie Awkward, on-call OB/GYN, who recommended starting patient on 10 mg of oral Provera daily for the next 20 days, and stressed the importance that patient will need to follow-up with them in the clinic.  I discussed this plan with the patient and she expresses understanding and agreement.  I have asked her to call the OB/GYN clinic tomorrow morning to schedule follow-up appointment and provided her with the phone number again.  She expresses understanding and agreement with plan.  Discharged home  in good condition.  Patient also has a small stye to the right upper eyelid, no signs of bacterial conjunctivitis.  Provided patient with erythromycin  ointment and encouraged her to use warm compresses and make sure she is cleaning her hands frequently.  Final Clinical Impressions(s) / ED Diagnoses   Final diagnoses:  Vaginal bleeding  Hordeolum externum of right upper eyelid    ED Discharge Orders         Ordered    medroxyPROGESTERone (PROVERA) 10 MG tablet  Daily     10/23/19 1401           Jodi Geralds Groom, [place] 10/23/19 1445    Mancel Bale, MD 10/24/19 1047

## 2019-10-23 NOTE — Discharge Instructions (Signed)
Take Provera daily at the same time for the next 20 days as directed.  Please call tomorrow morning to schedule follow-up appointment with the women's clinic for postpartum checkup.  Return to the ED if you have worsening bleeding feel lightheaded or like you may pass out or experience any other new or concerning symptoms.

## 2020-03-29 ENCOUNTER — Encounter (HOSPITAL_COMMUNITY): Payer: Self-pay

## 2020-03-29 ENCOUNTER — Other Ambulatory Visit: Payer: Self-pay

## 2020-03-29 ENCOUNTER — Ambulatory Visit (HOSPITAL_COMMUNITY)
Admission: EM | Admit: 2020-03-29 | Discharge: 2020-03-29 | Disposition: A | Discharge status: Home / Self Care | Payer: Medicaid Other | Attending: Family Medicine | Admitting: Family Medicine

## 2020-03-29 DIAGNOSIS — Z3202 Encounter for pregnancy test, result negative: Secondary | ICD-10-CM

## 2020-03-29 DIAGNOSIS — Z202 Contact with and (suspected) exposure to infections with a predominantly sexual mode of transmission: Secondary | ICD-10-CM | POA: Insufficient documentation

## 2020-03-29 DIAGNOSIS — R300 Dysuria: Secondary | ICD-10-CM | POA: Insufficient documentation

## 2020-03-29 DIAGNOSIS — Z113 Encounter for screening for infections with a predominantly sexual mode of transmission: Secondary | ICD-10-CM | POA: Insufficient documentation

## 2020-03-29 DIAGNOSIS — N898 Other specified noninflammatory disorders of vagina: Secondary | ICD-10-CM | POA: Insufficient documentation

## 2020-03-29 LAB — POCT URINALYSIS DIP (DEVICE)
Bilirubin Urine: NEGATIVE
Glucose, UA: NEGATIVE mg/dL
Hgb urine dipstick: NEGATIVE
Ketones, ur: NEGATIVE mg/dL
Nitrite: NEGATIVE
Protein, ur: NEGATIVE mg/dL
Specific Gravity, Urine: 1.015 (ref 1.005–1.030)
Urobilinogen, UA: 0.2 mg/dL (ref 0.0–1.0)
pH: 7 (ref 5.0–8.0)

## 2020-03-29 LAB — RAPID HIV SCREEN (HIV 1/2 AB+AG)
HIV 1/2 Antibodies: NONREACTIVE
HIV-1 P24 Antigen - HIV24: NONREACTIVE

## 2020-03-29 LAB — POC URINE PREG, ED: Preg Test, Ur: NEGATIVE

## 2020-03-29 LAB — POCT PREGNANCY, URINE: Preg Test, Ur: NEGATIVE

## 2020-03-29 MED ORDER — LIDOCAINE HCL (PF) 1 % IJ SOLN
INTRAMUSCULAR | Status: AC
  Filled 2020-03-29: qty 2

## 2020-03-29 MED ORDER — CEFTRIAXONE SODIUM 500 MG IJ SOLR
INTRAMUSCULAR | Status: AC
  Filled 2020-03-29: qty 500

## 2020-03-29 MED ORDER — CEFTRIAXONE SODIUM 500 MG IJ SOLR
500.0000 mg | Freq: Once | INTRAMUSCULAR | Status: AC
  Administered 2020-03-29: 500 mg via INTRAMUSCULAR

## 2020-03-29 MED ORDER — DOXYCYCLINE HYCLATE 100 MG PO CAPS: 100.0000 mg | ORAL_CAPSULE | Freq: Two times a day (BID) | ORAL | 0 refills | Status: DC

## 2020-03-29 NOTE — ED Provider Notes (Signed)
[facility]    CSN: [id number] Arrival date & time: 03/29/20  1558      History   Chief Complaint Chief Complaint  Patient presents with  . Abdominal Pain    RLQ, LLQ  . Back Pain    Lower  . Vaginal Itching    HPI Julia Roman is a 24 y.o. female.   Patient reports that she has been having vaginal itching for the last 2 days.  Reports that her boyfriend has been experiencing dysuria with some penile discharge.  Unsure of how long that her boyfriend has been experiencing symptoms.  She reports that he is also here today for STD testing and was given a shot and a prescription.  Denies headache denies pelvic pain denies nausea, vomiting, diarrhea, rash, fever, other symptoms.  Medical history significant for daily smoker, history of STDs, sickle cell trait.  ROS per HPI  The history is provided by the patient.    Past Medical History:  Diagnosis Date  . Anemia   . Chlamydia 2010  . Depression AGE 78   NO MEDS CURRENTLY  . Gonorrhea 2010  . Headache   . Ovarian cyst 2013  . Sickle cell trait (HCC)   . Trichomonas vaginalis infection 4/282015  . Urinary tract infection    OCC    Patient Active Problem List   Diagnosis Date Noted  . Encounter for induction of labor 08/09/2019  . Sickle cell trait (HCC)   . Non-compliant pregnant patient 05/16/2019  . Pruritic urticarial papules and plaques of pregnancy 05/16/2019  . Trichomonal vaginitis during pregnancy 01/19/2019  . Supervision of high risk pregnancy, antepartum 01/17/2019  . History of preterm delivery 04/21/2014  . Tobacco use disorder affecting pregnancy, antepartum 04/05/2014  . Current smoker 02/03/2013  . Marijuana use 02/03/2013  . History of depression 02/03/2013  . 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 NO PAST SURGERIES      OB History    Gravida  4   Para  2   Term  1   Preterm  1   AB  1   Living  2     SAB  1     TAB      Ectopic      Multiple      Live Births  2            Home Medications    Prior to Admission medications   Medication Sig Start Date End Date Taking? Authorizing Provider  acetaminophen (TYLENOL) 325 MG tablet Take 2 tablets (650 mg total) by mouth every 4 (four) hours as needed (for pain scale < 4). Patient not taking: Reported on 10/23/2019 08/11/19   Calvert Cantor, CNM  benzocaine-Menthol (DERMOPLAST) 20-0.5 % AERO Apply 1 application topically as needed for irritation (perineal discomfort). Patient not taking: Reported on 10/23/2019 08/11/19   Calvert Cantor, CNM  doxycycline (VIBRAMYCIN) 100 MG capsule Take 1 capsule (100 mg total) by mouth 2 (two) times daily. 03/29/20   Moshe Cipro, NP  ibuprofen (ADVIL) 800 MG tablet Take 1 tablet (800 mg total) by mouth every 8 (eight) hours as needed. Patient not taking: Reported on 10/23/2019 08/11/19   Calvert Cantor, CNM  loratadine (CLARITIN) 10 MG tablet Take 1 tablet (10 mg total) by mouth daily. Patient not taking: Reported on 10/23/2019 05/16/19   [place] Bing, MD  medroxyPROGESTERone (PROVERA) 10 MG tablet Take 1  tablet (10 mg total) by mouth daily for 20 days. 10/23/19 11/12/19  Jacqlyn Larsen, PA-C  Prenatal Vit-Fe Phos-FA-Omega (VITAFOL GUMMIES) 3.33-0.333-34.8 MG CHEW Chew 3 each by mouth daily. Patient not taking: Reported on 10/23/2019 01/17/19   Virginia Rochester, NP  witch hazel-glycerin (TUCKS) pad Apply 1 application topically as needed for hemorrhoids. Patient not taking: Reported on 10/23/2019 08/11/19   Darlina Rumpf, CNM    Family History Family History  Problem Relation Age of Onset  . Asthma Mother   . Fibromyalgia Mother   . Asthma Brother   . Arthritis Maternal Grandfather   . Diabetes Maternal Grandfather   . Hypertension Maternal Grandfather   . Kidney disease Maternal Grandfather   . Stroke Maternal Grandfather   . Birth defects Cousin        CLEFT LIP  . Cancer  Cousin   . Cancer Maternal Aunt        MOUTH  . Hypertension Maternal Aunt   . Heart disease Maternal Grandmother   . Hyperlipidemia Maternal Grandmother   . Hypertension Maternal Grandmother   . Diabetes Paternal Grandmother   . Hypertension Maternal Aunt     Social History Social History   Tobacco Use  . Smoking status: Current Every Day Smoker    Packs/day: 0.25    Types: Cigarettes  . Smokeless tobacco: Never Used  Substance Use Topics  . Alcohol use: Not Currently    Comment: 1 times a week  . Drug use: Yes    Frequency: 7.0 times per week    Types: Marijuana    Comment: daily     Allergies   Tramadol   Review of Systems Review of Systems   Physical Exam Triage Vital Signs ED Triage Vitals  Enc Vitals Group     BP 03/29/20 1638 118/73     Pulse Rate 03/29/20 1638 75     Resp 03/29/20 1638 18     Temp 03/29/20 1638 98.5 F (36.9 C)     Temp Source 03/29/20 1638 Oral     SpO2 03/29/20 1638 100 %     Weight --      Height --      Head Circumference --      Peak Flow --      Pain Score 03/29/20 1636 6     Pain Loc --      Pain Edu? --      Excl. in [place]? --    No data found.  Updated Vital Signs BP 118/73 (BP Location: Right Arm)   Pulse 75   Temp 98.5 F (36.9 C) (Oral)   Resp 18   LMP 03/13/2020   SpO2 100%   Visual Acuity Right Eye Distance:   Left Eye Distance:   Bilateral Distance:    Right Eye Near:   Left Eye Near:    Bilateral Near:     Physical Exam Vitals and nursing note reviewed.  Constitutional:      General: She is not in acute distress.    Appearance: Normal appearance. She is well-developed and normal weight. She is not ill-appearing.  HENT:     Head: Normocephalic and atraumatic.     Nose: Nose normal.     Mouth/Throat:     Mouth: Mucous membranes are moist.     Pharynx: Oropharynx is clear.  Eyes:     Extraocular Movements: Extraocular movements intact.     Conjunctiva/sclera: Conjunctivae normal.     Pupils:  Pupils  are equal, round, and reactive to light.  Cardiovascular:     Rate and Rhythm: Normal rate and regular rhythm.     Heart sounds: Normal heart sounds. No murmur.  Pulmonary:     Effort: Pulmonary effort is normal. No respiratory distress.     Breath sounds: Normal breath sounds. No stridor. No wheezing, rhonchi or rales.  Chest:     Chest wall: No tenderness.  Abdominal:     General: Bowel sounds are normal.     Palpations: Abdomen is soft.     Tenderness: There is no abdominal tenderness.  Musculoskeletal:        General: Normal range of motion.     Cervical back: Normal range of motion and neck supple.  Skin:    General: Skin is warm and dry.     Capillary Refill: Capillary refill takes less than 2 seconds.  Neurological:     General: No focal deficit present.     Mental Status: She is alert and oriented to person, place, and time.  Psychiatric:        Mood and Affect: Mood normal.        Behavior: Behavior normal.        Thought Content: Thought content normal.      UC Treatments / Results  Labs (all labs ordered are listed, but only abnormal results are displayed) Labs Reviewed  POCT URINALYSIS DIP (DEVICE) - Abnormal; Notable for the following components:      Result Value   Leukocytes,Ua TRACE (*)    All other components within normal limits  URINE CULTURE  RPR  RAPID HIV SCREEN (HIV 1/2 AB+AG)  POC URINE PREG, ED  POCT PREGNANCY, URINE  CERVICOVAGINAL ANCILLARY ONLY    EKG   Radiology No results found.  Procedures Procedures (including critical care time)  Medications Ordered in UC Medications  cefTRIAXone (ROCEPHIN) injection 500 mg (has no administration in time range)    Initial Impression / Assessment and Plan / UC Course  I have reviewed the triage vital signs and the nursing notes.  Pertinent labs & imaging results that were available during my care of the patient were reviewed by me and considered in my medical decision making (see  chart for details).     Vaginal itching/dysuria: Presents with vaginal itching and dysuria x2 days.  Abdomen nontender to palpation.  No fevers, chills, body aches.  Pelvic exam deferred per patient request.  UA obtained and has trace leuks.  Will culture to be sure that there is no infection.  Also obtained swab for gonorrhea, chlamydia, trichomonas, BV, yeast.  Also obtain blood samples for RPR and HIV.  Treated with Rocephin 500 mg IM in office today.  Doxycycline twice daily 100 mg x 7 days sent to the patient's pharmacy.  Instructed patient to abstain from sex for the next 7 days until treatment is completed.  Patient instructed that her results will be available via MyChart.  If she requires further treatment for any results, we will be in contact with her to let her know.  Patient verbalized understanding and is in agreement with treatment plan. Final Clinical Impressions(s) / UC Diagnoses   Final diagnoses:  Vaginal itching  Dysuria  Exposure to STD  Screen for STD (sexually transmitted disease)     Discharge Instructions     You were treated with an antibiotic today called Rocephin.  Additionally, you are prescribed doxycycline twice a day for 7 days.    Do not  have sex for 7 days.  Your STD tests are pending.  If your test results are positive, we will call you.  You may need additional treatment and your partner(s) may also need treatment.         ED Prescriptions    Medication Sig Dispense Auth. Provider   doxycycline (VIBRAMYCIN) 100 MG capsule Take 1 capsule (100 mg total) by mouth 2 (two) times daily. 14 capsule Moshe Cipro, NP     PDMP not reviewed this encounter.   Moshe Cipro, NP 03/29/20 1732

## 2020-03-29 NOTE — ED Triage Notes (Signed)
Pt presents with lower back pain, lower abdominal pain, and vaginal itching X 2 days.

## 2020-03-29 NOTE — Discharge Instructions (Signed)
You were treated with an antibiotic today called Rocephin.  Additionally, you are prescribed doxycycline twice a day for 7 days.    Do not have sex for 7 days.  Your STD tests are pending.  If your test results are positive, we will call you.  You may need additional treatment and your partner(s) may also need treatment.      

## 2020-03-30 ENCOUNTER — Telehealth (HOSPITAL_COMMUNITY): Payer: Self-pay

## 2020-03-30 LAB — CERVICOVAGINAL ANCILLARY ONLY
Bacterial Vaginitis (gardnerella): POSITIVE — AB
Candida Glabrata: NEGATIVE
Candida Vaginitis: NEGATIVE
Chlamydia: NEGATIVE
Comment: NEGATIVE
Comment: NEGATIVE
Comment: NEGATIVE
Comment: NEGATIVE
Comment: NEGATIVE
Comment: NORMAL
Neisseria Gonorrhea: POSITIVE — AB
Trichomonas: POSITIVE — AB

## 2020-03-30 LAB — RPR: RPR Ser Ql: NONREACTIVE

## 2020-03-30 MED ORDER — METRONIDAZOLE 500 MG PO TABS: 500.0000 mg | ORAL_TABLET | Freq: Two times a day (BID) | ORAL | 0 refills | Status: DC

## 2020-03-31 LAB — URINE CULTURE: Culture: NO GROWTH

## 2020-05-14 ENCOUNTER — Ambulatory Visit (INDEPENDENT_AMBULATORY_CARE_PROVIDER_SITE_OTHER): Payer: Medicaid Other | Admitting: *Deleted

## 2020-05-14 ENCOUNTER — Other Ambulatory Visit: Payer: Self-pay

## 2020-05-14 DIAGNOSIS — Z3490 Encounter for supervision of normal pregnancy, unspecified, unspecified trimester: Secondary | ICD-10-CM

## 2020-05-14 DIAGNOSIS — Z3201 Encounter for pregnancy test, result positive: Secondary | ICD-10-CM

## 2020-05-14 DIAGNOSIS — Z3200 Encounter for pregnancy test, result unknown: Secondary | ICD-10-CM

## 2020-05-14 LAB — POCT PREGNANCY, URINE: Preg Test, Ur: POSITIVE — AB

## 2020-05-14 NOTE — Progress Notes (Addendum)
Pt obtained urine specimen for UPT and was informed she would be called with results.   Called pt and informed her of +UPT today. She stated LMP approx 02/24/20 and is not sure if she had a period during month of April. Per chart review, pt had ED visit on 03/29/20 and had negative UPT at that time. She stated she had been incarcerated and really doesn't know when she got pregnant. Pt advised [place] for dating will be scheduled and she will be notified of appointment date and time. She will receive results in our office on the same [place] Hiller. Pt voiced understanding and agreed to plan of care.

## 2020-05-22 ENCOUNTER — Ambulatory Visit (INDEPENDENT_AMBULATORY_CARE_PROVIDER_SITE_OTHER): Payer: Medicaid Other | Admitting: General Practice

## 2020-05-22 ENCOUNTER — Ambulatory Visit
Admission: RE | Admit: 2020-05-22 | Discharge: 2020-05-22 | Disposition: A | Discharge status: Home / Self Care | Payer: Medicaid Other | Source: Ambulatory Visit | Attending: Family Medicine | Admitting: Family Medicine

## 2020-05-22 ENCOUNTER — Other Ambulatory Visit: Payer: Self-pay

## 2020-05-22 DIAGNOSIS — Z3200 Encounter for pregnancy test, result unknown: Secondary | ICD-10-CM | POA: Insufficient documentation

## 2020-05-22 DIAGNOSIS — Z3490 Encounter for supervision of normal pregnancy, unspecified, unspecified trimester: Secondary | ICD-10-CM | POA: Diagnosis present

## 2020-05-22 DIAGNOSIS — Z3A01 Less than 8 weeks gestation of pregnancy: Secondary | ICD-10-CM

## 2020-05-22 DIAGNOSIS — Z712 Person consulting for explanation of examination or test findings: Secondary | ICD-10-CM

## 2020-05-22 MED ORDER — PRENATAL 19 29-1 MG PO CHEW: 1.0000 | CHEWABLE_TABLET | Freq: Every day | ORAL | 11 refills | Status: DC

## 2020-05-22 NOTE — Progress Notes (Signed)
Patient presents to office today for dating ultrasound results. Reviewed results with Dr Salomon Mast who finds single living [facility] at [redacted]w[redacted]d, she may begin OB care.   Informed patient of results, reviewed dating, & patient had already been given u/s pictures. Patient verbalized understanding & Rx for PNV given. Patient is uncertain about keeping pregnancy but would like to schedule a new OB appt. Patient will check out with front office staff to schedule appt.   Chase Caller RN BSN 05/22/20

## 2020-05-24 NOTE — Progress Notes (Signed)
Chart reviewed for nurse visit. Agree with plan of care.   Bevin Mayall L, DO 05/24/2020 7:57 AM

## 2020-06-20 ENCOUNTER — Telehealth (INDEPENDENT_AMBULATORY_CARE_PROVIDER_SITE_OTHER): Payer: Medicaid Other | Admitting: *Deleted

## 2020-06-20 DIAGNOSIS — O0990 Supervision of high risk pregnancy, unspecified, unspecified trimester: Secondary | ICD-10-CM

## 2020-06-20 DIAGNOSIS — Z5320 Procedure and treatment not carried out because of patient's decision for unspecified reasons: Secondary | ICD-10-CM

## 2020-06-20 NOTE — Progress Notes (Signed)
1:10  Kiera does not have MyChart and is not connected virtually for her new ob intake. I called Albert and she confirmed 2 identifiers. Before we started she informed me she wanted to let us know she does not need ob care; she had an abortion. She states they told her she just needed to follow up with her doctor.  Per chart did not have postpartum visit 2020 after that pregnancy, had a pap in 01/2019.  Patient denies any issues or problems after abortion; states it went smoothly. I informed her I will have registrars call her with appointment for check up. She voices understanding. Kenasia Scheller,RN

## 2020-06-21 ENCOUNTER — Encounter: Payer: Medicaid Other | Admitting: Obstetrics and Gynecology

## 2020-06-26 NOTE — Progress Notes (Signed)
Patient was assessed and managed by nursing staff during this encounter. I have reviewed the chart and agree with the documentation and plan. I have also made any necessary editorial changes.  Ziv Welchel, MD 06/26/2020 8:19 AM   

## 2020-07-16 ENCOUNTER — Ambulatory Visit: Payer: Medicaid Other | Admitting: Family Medicine

## 2020-09-24 ENCOUNTER — Ambulatory Visit (INDEPENDENT_AMBULATORY_CARE_PROVIDER_SITE_OTHER): Payer: Medicaid Other | Admitting: Lactation Services

## 2020-09-24 ENCOUNTER — Other Ambulatory Visit: Payer: Self-pay

## 2020-09-24 ENCOUNTER — Telehealth (INDEPENDENT_AMBULATORY_CARE_PROVIDER_SITE_OTHER): Payer: Medicaid Other | Admitting: Lactation Services

## 2020-09-24 DIAGNOSIS — Z3200 Encounter for pregnancy test, result unknown: Secondary | ICD-10-CM

## 2020-09-24 DIAGNOSIS — Z3490 Encounter for supervision of normal pregnancy, unspecified, unspecified trimester: Secondary | ICD-10-CM

## 2020-09-24 DIAGNOSIS — Z3201 Encounter for pregnancy test, result positive: Secondary | ICD-10-CM | POA: Diagnosis not present

## 2020-09-24 LAB — POCT PREGNANCY, URINE: Preg Test, Ur: POSITIVE — AB

## 2020-09-24 NOTE — Telephone Encounter (Signed)
Called patient with results of + UPT. Patient is established patient of [facility].   Patient reports she is not taking PNV. Prescription is still active at her pharmacy. Patient informed.   Patient reports she had an abortion in June. She feels like she may have had a cycle in July or August but is unsure of when.   Patient reports no bleeding. Patient reports she is having intermittent cramping and sharp pains occasionally. The pains are in the middle of her abdomen.   [place] ordered for dating and scheduled for 10/28 at 8 am. Arrive at 7:45 am with a full bladder.   Reviewed ectopic precautions and when to seek emergency care through [facility]. Patient voiced understanding.   Reviewed that once [place] results are back she will be contacted about starting [facility].

## 2020-09-24 NOTE — Progress Notes (Signed)
Opened in error

## 2020-10-03 ENCOUNTER — Ambulatory Visit
Admission: RE | Admit: 2020-10-03 | Discharge: 2020-10-03 | Disposition: A | Discharge status: Home / Self Care | Payer: Medicaid Other | Source: Ambulatory Visit | Attending: Obstetrics and Gynecology | Admitting: Obstetrics and Gynecology

## 2020-10-03 ENCOUNTER — Other Ambulatory Visit: Payer: Self-pay

## 2020-10-03 DIAGNOSIS — Z3490 Encounter for supervision of normal pregnancy, unspecified, unspecified trimester: Secondary | ICD-10-CM | POA: Diagnosis not present

## 2020-10-04 ENCOUNTER — Ambulatory Visit: Payer: Medicaid Other

## 2020-10-22 ENCOUNTER — Other Ambulatory Visit: Payer: Self-pay

## 2020-10-22 ENCOUNTER — Ambulatory Visit (INDEPENDENT_AMBULATORY_CARE_PROVIDER_SITE_OTHER): Payer: Medicaid Other | Admitting: Obstetrics and Gynecology

## 2020-10-22 ENCOUNTER — Other Ambulatory Visit (HOSPITAL_COMMUNITY)
Admission: RE | Admit: 2020-10-22 | Discharge: 2020-10-22 | Disposition: A | Discharge status: Home / Self Care | Payer: Medicaid Other | Source: Ambulatory Visit | Attending: Obstetrics and Gynecology | Admitting: Obstetrics and Gynecology

## 2020-10-22 ENCOUNTER — Encounter: Payer: Self-pay | Admitting: Obstetrics and Gynecology

## 2020-10-22 VITALS — BP 115/70 | HR 59 | Wt 105.4 lb

## 2020-10-22 DIAGNOSIS — O0990 Supervision of high risk pregnancy, unspecified, unspecified trimester: Secondary | ICD-10-CM | POA: Insufficient documentation

## 2020-10-22 DIAGNOSIS — Z8659 Personal history of other mental and behavioral disorders: Secondary | ICD-10-CM

## 2020-10-22 DIAGNOSIS — F17200 Nicotine dependence, unspecified, uncomplicated: Secondary | ICD-10-CM | POA: Diagnosis not present

## 2020-10-22 DIAGNOSIS — Z3A11 11 weeks gestation of pregnancy: Secondary | ICD-10-CM | POA: Diagnosis not present

## 2020-10-22 DIAGNOSIS — F1290 Cannabis use, unspecified, uncomplicated: Secondary | ICD-10-CM

## 2020-10-22 DIAGNOSIS — Z8751 Personal history of pre-term labor: Secondary | ICD-10-CM

## 2020-10-22 DIAGNOSIS — D582 Other hemoglobinopathies: Secondary | ICD-10-CM

## 2020-10-22 MED ORDER — PRENATAL FORMULA 28-0.8-235 MG PO CAPS: 1.0000 | ORAL_CAPSULE | Freq: Every day | ORAL | 8 refills | Status: DC

## 2020-10-22 MED ORDER — BLOOD PRESSURE KIT DEVI: 1.0000 | Freq: Every day | 0 refills | Status: DC

## 2020-10-22 NOTE — Progress Notes (Signed)
Wants a different prenatal vitamins Last pap 01/17/2019

## 2020-10-22 NOTE — Progress Notes (Signed)
INITIAL PRENATAL VISIT NOTE  Subjective:  Julia Roman is a 24 y.o. [phone] at 69w0dby 8 week u/s being seen today for her initial prenatal visit. This is a unplanned pregnancy.  She used depo once  for birth control previously. She has an obstetric history significant for 25 week preterm delivery followed by a term delivery. She has a medical history significant for sickle cell trait and marijuana use.  Patient reports no complaints.  Contractions: Not present. Vag. Bleeding: None.  Movement: Absent. Denies leaking of fluid.    Past Medical History:  Diagnosis Date  . Anemia   . Chlamydia 2010  . Depression AGE 72   NO MEDS CURRENTLY  . Gonorrhea 2010  . Headache   . Ovarian cyst 2013  . Sickle cell trait ([place]   . Trichomonas vaginalis infection 4/282015  . Urinary tract infection    OCC    Past Surgical History:  Procedure Laterality Date  . NO PAST SURGERIES      OB History  Gravida Para Term Preterm AB Living  '7 3 2 1 1 2  ' SAB TAB Ectopic Multiple Live Births  1       3    # Outcome Date GA Lbr Len/2nd Weight Sex Delivery Anes PTL Lv  7 Current           6 SAB 06/06/18 258w4d       5 Preterm 04/19/14 2549w2d:12 / 00:14 2 lb 0.8 oz (0.93 kg) F Vag-Spont None  LIV  4 Term 08/11/13 37w16w0d23 / 00:26 4 lb 15.7 oz (2.26 kg) M Vag-Spont EPI  LIV  3 Term      Vag-Spont     2 Gravida           1 Gravida             Social History   Socioeconomic History  . Marital status: Single    Spouse name: Not on file  . Number of children: Not on file  . Years of education: 11+  . Highest education level: Not on file  Occupational History  . Occupation: STUDENT    Employer: NOT EMPLOYED  Tobacco Use  . Smoking status: Current Every Day Smoker    Packs/day: 0.25    Types: Cigarettes  . Smokeless tobacco: Never Used  Vaping Use  . Vaping Use: Never used  Substance and Sexual Activity  . Alcohol use: Not Currently    Comment: 1 times a week  . Drug use: Yes     Frequency: 7.0 times per week    Types: Marijuana    Comment: daily  . Sexual activity: Yes    Partners: Male    Birth control/protection: None  Other Topics Concern  . Not on file  Social History Narrative  . Not on file   Social Determinants of Health   Financial Resource Strain:   . Difficulty of Paying Living Expenses: Not on file  Food Insecurity:   . Worried About RunnCharity fundraiserthe Last Year: Not on file  . Ran Out of Food in the Last Year: Not on file  Transportation Needs:   . Lack of Transportation (Medical): Not on file  . Lack of Transportation (Non-Medical): Not on file  Physical Activity:   . Days of Exercise per Week: Not on file  . Minutes of Exercise per Session: Not on file  Stress:   . Feeling of Stress : Not  on file  Social Connections:   . Frequency of Communication with Friends and Family: Not on file  . Frequency of Social Gatherings with Friends and Family: Not on file  . Attends Religious Services: Not on file  . Active Member of Clubs or Organizations: Not on file  . Attends Archivist Meetings: Not on file  . Marital Status: Not on file    Family History  Problem Relation Age of Onset  . Asthma Mother   . Fibromyalgia Mother   . Asthma Brother   . Arthritis Maternal Grandfather   . Diabetes Maternal Grandfather   . Hypertension Maternal Grandfather   . Kidney disease Maternal Grandfather   . Stroke Maternal Grandfather   . Birth defects Cousin        CLEFT LIP  . Cancer Cousin   . Cancer Maternal Aunt        MOUTH  . Hypertension Maternal Aunt   . Heart disease Maternal Grandmother   . Hyperlipidemia Maternal Grandmother   . Hypertension Maternal Grandmother   . Diabetes Paternal Grandmother   . Hypertension Maternal Aunt      Current Outpatient Medications:  .  acetaminophen (TYLENOL) 325 MG tablet, Take 2 tablets (650 mg total) by mouth every 4 (four) hours as needed (for pain scale < 4). (Patient not  taking: Reported on 10/23/2019), Disp: 30 tablet, Rfl: 0 .  Blood Pressure Monitoring (BLOOD PRESSURE KIT) DEVI, 1 Device by Does not apply route daily. ICD 10: Z34.00, Disp: 1 each, Rfl: 0 .  Prenatal Vit-Fe Fum-FA-Omega (PRENATAL FORMULA) 28-0.8-235 MG CAPS, Take 1 capsule by mouth daily., Disp: 30 capsule, Rfl: 8  Allergies  Allergen Reactions  . Tramadol Hives and Rash    Entire body rash    Review of Systems: Negative except for what is mentioned in HPI.  Objective:   Vitals:   10/22/20 0957  BP: 115/70  Pulse: (!) 59  Weight: 105 lb 6.4 oz (47.8 kg)    Fetal Status: Fetal Heart Rate (bpm): 154   Movement: Absent     Physical Exam: BP 115/70   Pulse (!) 59   Wt 105 lb 6.4 oz (47.8 kg)   LMP 03/13/2020   BMI 19.28 kg/m  CONSTITUTIONAL: Well-developed, well-nourished female in no acute distress.  NEUROLOGIC: Alert and oriented to person, place, and time. Normal reflexes, muscle tone coordination. No cranial nerve deficit noted. PSYCHIATRIC: Normal mood and affect. Normal behavior. Normal judgment and thought content. SKIN: Skin is warm and dry. No rash noted. Not diaphoretic. No erythema. No pallor. HENT:  Normocephalic, atraumatic, External right and left ear normal. Oropharynx is clear and moist EYES: Conjunctivae and EOM are normal. Pupils are equal, round, and reactive to light. No scleral icterus.  NECK: Normal range of motion, supple, no masses CARDIOVASCULAR: Normal heart rate noted, regular rhythm RESPIRATORY: Effort and breath sounds normal, no problems with respiration noted BREASTS: symmetric, non-tender, no masses palpable ABDOMEN: Soft, nontender, nondistended, gravid. GU: normal appearing external female genitalia,  Bimanual: 10 weeks sized uterus, no adnexal tenderness or palpable lesions noted MUSCULOSKELETAL: Normal range of motion. EXT:  No edema and no tenderness. 2+ distal pulses.   Assessment and Plan:  Pregnancy: [id number] at 33w0dby  ultrasound  1. Supervision of high risk pregnancy, antepartum  - Blood Pressure Monitoring (BLOOD PRESSURE KIT) DEVI; 1 Device by Does not apply route daily. ICD 10: Z34.00  Dispense: 1 each; Refill: 0 - Culture, OB Urine - GC/Chlamydia probe amp (  [place])not at [facility] - Genetic Screening - US MFM OB DETAIL +14 WK; Future - CBC/D/Plt+RPR+Rh+ABO+Rub Ab... - Ambulatory referral to [facility] - Prenatal Vit-Fe Fum-FA-Omega (PRENATAL FORMULA) 28-0.8-235 MG CAPS; Take 1 capsule by mouth daily.  Dispense: 30 capsule; Refill: 8  2. [redacted] weeks gestation of pregnancy   3. Marijuana use Pt not currently using THC, advised not to restart  4. Current smoker Pt smokes 3 cigs/day, advised to cease if possible  5. History of depression Behavorial health consult scheduled  6. Hemoglobin C trait ([place])   7. History of preterm delivery makena to be used this pregnancy, she does not desire to self inject, cervical length at time of anatomy scan for baseline - [place] MFM OB TRANSVAGINAL; Future   Preterm labor symptoms and general obstetric precautions including but not limited to vaginal bleeding, contractions, leaking of fluid and fetal movement were reviewed in detail with the patient.  Please refer to After Visit Summary for other counseling recommendations.   Return in about 4 weeks (around 11/19/2020) for [facility], in person.  Griffin Basil 10/22/2020 10:57 AM

## 2020-10-22 NOTE — Patient Instructions (Signed)
Preventing Preterm Birth Preterm birth is when your baby is delivered between 20 weeks and 37 weeks of pregnancy. A full-term pregnancy lasts for at least 37 weeks. Preterm birth can be dangerous for your baby because the last few weeks of pregnancy are an important time for your baby's brain and lungs to grow. Many things can cause a baby to be born early. Sometimes the cause is not known. There are certain factors that make you more likely to experience preterm birth, such as:  Having a previous baby born preterm.  Being pregnant with twins or other multiples.  Having had fertility treatment.  Being overweight or underweight at the start of your pregnancy.  Having any of the following during pregnancy: ? An infection, including a urinary tract infection (UTI) or an STI (sexually transmitted infection). ? High blood pressure. ? Diabetes. ? Vaginal bleeding.  Being age 35 or older.  Being age 18 or younger.  Getting pregnant within 6 months of a previous pregnancy.  Suffering extreme stress or physical or emotional abuse during pregnancy.  Standing for long periods of time during pregnancy, such as working at a job that requires standing. What are the risks? The most serious risk of preterm birth is that the baby may not survive. This is more likely to happen if a baby is born before 34 weeks. Other risks and complications of preterm birth may include your baby having:  Breathing problems.  Brain damage that affects movement and coordination (cerebral palsy).  Feeding difficulties.  Vision or hearing problems.  Infections or inflammation of the digestive tract (colitis).  Developmental delays.  Learning disabilities.  Higher risk for diabetes, heart disease, and high blood pressure later in life. What can I do to lower my risk?  Medical care The most important thing you can do to lower your risk for preterm birth is to get routine medical care during pregnancy (prenatal  care). If you have a high risk of preterm birth, you may be referred to a health care provider who specializes in managing high-risk pregnancies (perinatologist). You may be given medicine to help prevent preterm birth. Lifestyle changes Certain lifestyle changes can also lower your risk of preterm birth:  Wait at least 6 months after a pregnancy to become pregnant again.  Try to plan pregnancy for when you are between 19 and 35 years old.  Get to a healthy weight before getting pregnant. If you are overweight, work with your health care provider to safely lose weight.  Do not use any products that contain nicotine or tobacco, such as cigarettes and e-cigarettes. If you need help quitting, ask your health care provider.  Do not drink alcohol.  Do not use drugs. Where to find support For more support, consider:  Talking with your health care provider.  Talking with a therapist or substance abuse counselor, if you need help quitting.  Working with a diet and nutrition specialist (dietitian) or a personal trainer to maintain a healthy weight.  Joining a support group. Where to find more information Learn more about preventing preterm birth from:  Centers for Disease Control and Prevention: cdc.gov/reproductivehealth/maternalinfanthealth/pretermbirth.htm  March of Dimes: marchofdimes.org/complications/premature-babies.aspx  American Pregnancy Association: americanpregnancy.org/labor-and-birth/premature-labor Contact a health care provider if:  You have any of the following signs of preterm labor before 37 weeks: ? A change or increase in vaginal discharge. ? Fluid leaking from your vagina. ? Pressure or cramps in your lower abdomen. ? A backache that does not go away or gets worse. ?   Regular tightening (contractions) in your lower abdomen. Summary  Preterm birth means having your baby during weeks 20-37 of pregnancy.  Preterm birth may put your baby at risk for physical and  mental problems.  Getting good prenatal care can help prevent preterm birth.  You can lower your risk of preterm birth by making certain lifestyle changes, such as not smoking and not using alcohol. This information is not intended to replace advice given to you by your health care provider. Make sure you discuss any questions you have with your health care provider. Document Revised: 11/06/2017 Document Reviewed: 08/02/2016 Elsevier Patient Education  2020 [employer]. First Trimester of Pregnancy  The first trimester of pregnancy is from week 1 until the end of week 13 (months 1 through 3). During this time, your baby will begin to develop inside you. At 6-8 weeks, the eyes and face are formed, and the heartbeat can be seen on ultrasound. At the end of 12 weeks, all the baby's organs are formed. Prenatal care is all the medical care you receive before the birth of your baby. Make sure you get good prenatal care and follow all of your doctor's instructions. Follow these instructions at home: Medicines  Take over-the-counter and prescription medicines only as told by your doctor. Some medicines are safe and some medicines are not safe during pregnancy.  Take a prenatal vitamin that contains at least 600 micrograms (mcg) of folic acid.  If you have trouble pooping (constipation), take medicine that will make your stool soft (stool softener) if your doctor approves. Eating and drinking   Eat regular, healthy meals.  Your doctor will tell you the amount of weight gain that is right for you.  Avoid raw meat and uncooked cheese.  If you feel sick to your stomach (nauseous) or throw up (vomit): ? Eat 4 or 5 small meals a day instead of 3 large meals. ? Try eating a few soda crackers. ? Drink liquids between meals instead of during meals.  To prevent constipation: ? Eat foods that are high in fiber, like fresh fruits and vegetables, whole grains, and beans. ? Drink enough fluids to keep  your pee (urine) clear or pale yellow. Activity  Exercise only as told by your doctor. Stop exercising if you have cramps or pain in your lower belly (abdomen) or low back.  Do not exercise if it is too hot, too humid, or if you are in a place of great height (high altitude).  Try to avoid standing for long periods of time. Move your legs often if you must stand in one place for a long time.  Avoid heavy lifting.  Wear low-heeled shoes. Sit and stand up straight.  You can have sex unless your doctor tells you not to. Relieving pain and discomfort  Wear a good support bra if your breasts are sore.  Take warm water baths (sitz baths) to soothe pain or discomfort caused by hemorrhoids. Use hemorrhoid cream if your doctor says it is okay.  Rest with your legs raised if you have leg cramps or low back pain.  If you have puffy, bulging veins (varicose veins) in your legs: ? Wear support hose or compression stockings as told by your doctor. ? Raise (elevate) your feet for 15 minutes, 3-4 times a day. ? Limit salt in your food. Prenatal care  Schedule your prenatal visits by the twelfth week of pregnancy.  Write down your questions. Take them to your prenatal visits.  Keep all  your prenatal visits as told by your doctor. This is important. Safety  Wear your seat belt at all times when driving.  Make a list of emergency phone numbers. The list should include numbers for family, friends, the hospital, and police and fire departments. General instructions  Ask your doctor for a referral to a local prenatal class. Begin classes no later than at the start of month 6 of your pregnancy.  Ask for help if you need counseling or if you need help with nutrition. Your doctor can give you advice or tell you where to go for help.  Do not use hot tubs, steam rooms, or saunas.  Do not douche or use tampons or scented sanitary pads.  Do not cross your legs for long periods of time.  Avoid  all herbs and alcohol. Avoid drugs that are not approved by your doctor.  Do not use any tobacco products, including cigarettes, chewing tobacco, and electronic cigarettes. If you need help quitting, ask your doctor. You may get counseling or other support to help you quit.  Avoid cat litter boxes and soil used by cats. These carry germs that can cause birth defects in the baby and can cause a loss of your baby (miscarriage) or stillbirth.  Visit your dentist. At home, brush your teeth with a soft toothbrush. Be gentle when you floss. Contact a doctor if:  You are dizzy.  You have mild cramps or pressure in your lower belly.  You have a nagging pain in your belly area.  You continue to feel sick to your stomach, you throw up, or you have watery poop (diarrhea).  You have a bad smelling fluid coming from your vagina.  You have pain when you pee (urinate).  You have increased puffiness (swelling) in your face, hands, legs, or ankles. Get help right away if:  You have a fever.  You are leaking fluid from your vagina.  You have spotting or bleeding from your vagina.  You have very bad belly cramping or pain.  You gain or lose weight rapidly.  You throw up blood. It may look like coffee grounds.  You are around people who have [place] measles, fifth disease, or chickenpox.  You have a very bad headache.  You have shortness of breath.  You have any kind of trauma, such as from a fall or a car accident. Summary  The first trimester of pregnancy is from week 1 until the end of week 13 (months 1 through 3).  To take care of yourself and your unborn baby, you will need to eat healthy meals, take medicines only if your doctor tells you to do so, and do activities that are safe for you and your baby.  Keep all follow-up visits as told by your doctor. This is important as your doctor will have to ensure that your baby is healthy and growing well. This information is not intended  to replace advice given to you by your health care provider. Make sure you discuss any questions you have with your health care provider. Document Revised: 03/17/2019 Document Reviewed: 12/02/2016 Elsevier Patient Education  2020 [employer].

## 2020-10-23 LAB — HCV INTERPRETATION

## 2020-10-23 LAB — CBC/D/PLT+RPR+RH+ABO+RUB AB...
Antibody Screen: NEGATIVE
Basophils Absolute: 0 10*3/uL (ref 0.0–0.2)
Basos: 0 %
EOS (ABSOLUTE): 0.1 10*3/uL (ref 0.0–0.4)
Eos: 2 %
HCV Ab: <0.1 s/co ratio (ref 0.0–0.9)
HIV Screen 4th Generation wRfx: NONREACTIVE
Hematocrit: 36.5 % (ref 34.0–46.6)
Hemoglobin: 12.3 g/dL (ref 11.1–15.9)
Hepatitis B Surface Ag: NEGATIVE
Immature Grans (Abs): 0 10*3/uL (ref 0.0–0.1)
Immature Granulocytes: 0 %
Lymphocytes Absolute: 1.7 10*3/uL (ref 0.7–3.1)
Lymphs: 29 %
MCH: 24.8 pg — ABNORMAL LOW (ref 26.6–33.0)
MCHC: 33.7 g/dL (ref 31.5–35.7)
MCV: 74 fL — ABNORMAL LOW (ref 79–97)
Monocytes Absolute: 0.5 10*3/uL (ref 0.1–0.9)
Monocytes: 9 %
Neutrophils Absolute: 3.5 10*3/uL (ref 1.4–7.0)
Neutrophils: 60 %
Platelets: 342 10*3/uL (ref 150–450)
RBC: 4.95 x10E6/uL (ref 3.77–5.28)
RDW: 20.2 % — ABNORMAL HIGH (ref 11.7–15.4)
RPR Ser Ql: NONREACTIVE
Rh Factor: POSITIVE
Rubella Antibodies, IGG: 2.14 index (ref >0.99)
WBC: 5.9 10*3/uL (ref 3.4–10.8)

## 2020-10-23 LAB — GC/CHLAMYDIA PROBE AMP (~~LOC~~) NOT AT ARMC
Chlamydia: NEGATIVE
Comment: NEGATIVE
Comment: NORMAL
Neisseria Gonorrhea: NEGATIVE

## 2020-10-24 ENCOUNTER — Encounter: Payer: Self-pay | Admitting: *Deleted

## 2020-10-24 ENCOUNTER — Other Ambulatory Visit: Payer: Self-pay

## 2020-10-24 DIAGNOSIS — Z8659 Personal history of other mental and behavioral disorders: Secondary | ICD-10-CM

## 2020-10-24 LAB — URINE CULTURE, OB REFLEX

## 2020-10-24 LAB — CULTURE, OB URINE

## 2020-10-24 NOTE — Progress Notes (Signed)
Received request from Misty Stanley, pregnancy navigator, that patient needs to speak with Asher Muir.  Referral placed via Epic.    Addison Naegeli, RN  10/24/20

## 2020-10-25 NOTE — BH Specialist Note (Deleted)
Integrated Behavioral Health via Telemedicine Video (Caregility) Visit  10/25/2020 JENISIS HARMSEN [id number]  Number of Integrated Behavioral Health visits: 1 Session Start time: 1:45***  Session End time: 2:15*** Total time: {IBH Total Time:21014050} minutes  Referring Provider: *** Type of Service: Individual, Family, *** Patient/Family location: *** [facility] Provider location: *** All persons participating in visit: ***   I connected with Kalilah U Shiley and/or Tanicka U Thane's {family members:20773} by a video enabled telemedicine application (Caregility) and verified that I am speaking with the correct person using two identifiers.   Discussed confidentiality: {YES/NO:21197}  Confirmed demographics & insurance:  {YES/NO:21197}  I discussed that engaging in this virtual visit, they consent to the provision of behavioral healthcare and the services will be billed under their insurance.   Patient and/or legal guardian expressed understanding and consented to virtual visit: {YES/NO:21197}  PRESENTING CONCERNS: Patient and/or family reports the following symptoms/concerns: *** Duration of problem: ***; Severity of problem: {Mild/Moderate/Severe:20260}  STRENGTHS (Protective Factors/Coping Skills): {CHL AMB BH PROTECTIVE FACTORS/[phone]}  ASSESSMENT: Patient currently experiencing ***.    GOALS ADDRESSED: Patient will: 1.  Reduce symptoms of: {IBH Symptoms:21014056}  2.  Increase knowledge and/or ability of: {IBH Patient Tools:21014057}  3.  Demonstrate ability to: {IBH Goals:21014053}   Progress of Goals: {CHL AMB BH PROGRESS TOWARDS MEQAS:[id number]}  INTERVENTIONS: Interventions utilized:  {IBH Interventions:21014054} Standardized Assessments completed & reviewed: {IBH Screening Tools:21014051}   OUTCOME: Patient Response: ***   PLAN: 1. Follow up with behavioral health clinician on : *** 2. Behavioral recommendations: *** 3. Referral(s): {IBH  Referrals:21014055}  I discussed the assessment and treatment plan with the patient and/or parent/guardian. They were provided an opportunity to ask questions and all were answered. They agreed with the plan and demonstrated an understanding of the instructions.   They were advised to call back or seek an in-person evaluation as appropriate.  I discussed that the purpose of this visit is to provide behavioral health care while limiting exposure to the novel coronavirus.  Discussed there is a possibility of technology failure and discussed alternative modes of communication if that failure occurs.  Valetta Close Amire Leazer  Depression screen [facility] 2/9 10/22/2020 05/16/2019 02/14/2019 01/17/2019  Decreased Interest 1 1 2 2   Down, Depressed, Hopeless 2 0 1 1  PHQ - 2 Score 3 1 3 3   Altered sleeping 1 0 2 1  Tired, decreased energy 3 2 3 2   Change in appetite 2 0 1 1  Feeling bad or failure about yourself  2 0 0 0  Trouble concentrating 1 0 0 0  Moving slowly or fidgety/restless 0 0 0 0  Suicidal thoughts 0 0 0 0  PHQ-9 Score 12 3 9 7    GAD 7 : Generalized Anxiety Score 10/22/2020 05/16/2019 02/14/2019 01/17/2019  Nervous, Anxious, on Edge 1 1 1 1   Control/stop worrying 1 0 1 2  Worry too much - different things 1 1 0 1  Trouble relaxing 1 0 1 1  Restless 0 0 0 0  Easily annoyed or irritable 2 3 2 2   Afraid - awful might happen 1 0 0 1  Total GAD 7 Score 7 5 5 8     ***

## 2020-11-05 ENCOUNTER — Encounter: Payer: Self-pay | Admitting: *Deleted

## 2020-11-06 ENCOUNTER — Other Ambulatory Visit: Payer: Self-pay

## 2020-11-12 NOTE — BH Specialist Note (Deleted)
Integrated Behavioral Health via Telemedicine Visit  11/12/2020 FRUMA AFRICA [id number]  Number of Integrated Behavioral Health visits: 1 Session Start time: 1:15***  Session End time: 2:15*** Total time: {IBH Total WNIO:27035009}  Referring Provider: Mariel Aloe, MD Patient/Family location: Home*** [facility] Provider [facility]: [facility]  All persons participating in visit: Patient *** and [facility] Latana Colin ***  Types of Service: {CHL AMB TYPE OF [phone]}  I connected with Grant Fontana and/or Zenab U Reinitz's {family members:20773} by {CHL AMB IBH TELEMEDICINE MODES:[phone]} and verified that I am speaking with the correct person using two identifiers.    Discussed confidentiality: {YES/NO:21197}  I discussed the limitations of telemedicine and the availability of in person appointments.  Discussed there is a possibility of technology failure and discussed alternative modes of communication if that failure occurs.  I discussed that engaging in this telemedicine visit, they consent to the provision of behavioral healthcare and the services will be billed under their insurance.  Patient and/or legal guardian expressed understanding and consented to Telemedicine visit: {YES/NO:21197}  Presenting Concerns: Patient and/or family reports the following symptoms/concerns: *** Duration of problem: ***; Severity of problem: {Mild/Moderate/Severe:20260}  Patient and/or Family's Strengths/Protective Factors: {CHL AMB BH PROTECTIVE FACTORS:[phone]}  Goals Addressed: Patient will: 1.  Reduce symptoms of: {IBH Symptoms:21014056}  2.  Increase knowledge and/or ability of: {IBH Patient Tools:21014057}  3.  Demonstrate ability to: {IBH Goals:21014053}  Progress towards Goals: {CHL AMB BH PROGRESS TOWARDS GOALS:[phone]}  Interventions: Interventions utilized:  {IBH Interventions:21014054} Standardized Assessments  completed: {IBH Screening Tools:21014051}  Patient and/or Family Response: ***  Assessment: Patient currently experiencing ***.   Patient may benefit from ***.  Plan: 1. Follow up with behavioral health clinician on : *** 2. Behavioral recommendations: *** 3. Referral(s): {IBH Referrals:21014055}  I discussed the assessment and treatment plan with the patient and/or parent/guardian. They were provided an opportunity to ask questions and all were answered. They agreed with the plan and demonstrated an understanding of the instructions.   They were advised to call back or seek an in-person evaluation if the symptoms worsen or if the condition fails to improve as anticipated.  Valetta Close Loreda Silverio, LCSW

## 2020-11-21 ENCOUNTER — Ambulatory Visit (INDEPENDENT_AMBULATORY_CARE_PROVIDER_SITE_OTHER): Payer: Medicaid Other | Admitting: Student

## 2020-11-21 ENCOUNTER — Encounter: Payer: Self-pay | Admitting: Student

## 2020-11-21 VITALS — BP 129/73 | HR 69 | Wt 98.8 lb

## 2020-11-21 DIAGNOSIS — Z3A15 15 weeks gestation of pregnancy: Secondary | ICD-10-CM

## 2020-11-21 DIAGNOSIS — O0990 Supervision of high risk pregnancy, unspecified, unspecified trimester: Secondary | ICD-10-CM | POA: Diagnosis not present

## 2020-11-21 NOTE — Progress Notes (Signed)
   PRENATAL VISIT NOTE  Subjective:  Julia Roman is a 24 y.o. [phone] at [redacted]w[redacted]d being seen today for ongoing prenatal care.  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  Patient reports no complaints.  Contractions: Not present. Vag. Bleeding: None.  Movement: Absent. Denies leaking of fluid.   The following portions of the patient's history were reviewed and updated as appropriate: allergies, current medications, past family history, past medical history, past social history, past surgical history and problem list.   Objective:   Vitals:   11/21/20 1452  BP: 129/73  Pulse: 69  Weight: 98 lb 12.8 oz (44.8 kg)    Fetal Status: Fetal Heart Rate (bpm): 166   Movement: Absent     General:  Alert, oriented and cooperative. Patient is in no acute distress.  Skin: Skin is warm and dry. No rash noted.   Cardiovascular: Normal heart rate noted  Respiratory: Normal respiratory effort, no problems with respiration noted  Abdomen: Soft, gravid, appropriate for gestational age.  Pain/Pressure: Present     Pelvic: Cervical exam deferred        Extremities: Normal range of motion.  Edema: None  Mental Status: Normal mood and affect. Normal behavior. Normal judgment and thought content.   Assessment and Plan:  Pregnancy: [id number] at [redacted]w[redacted]d 1. Supervision of high risk pregnancy, antepartum -Doing well; smoking 3 cigarettes a day but she is cutting back -GTPAL updated -Interested in PP IUD -Makena is ordered and she will bring in to have  someone show her friend how to give her injections.  -AFP today   Preterm labor symptoms and general obstetric precautions including but not limited to vaginal bleeding, contractions, leaking of fluid and fetal movement were reviewed in detail with the patient. Please refer to After Visit Summary for other counseling recommendations.   Return in about 4 weeks (around 12/19/2020), or LROB and please schedule detailed anatomy scan for 18-23 weeks.  Future Appointments  Date Time Provider Department Center  12/13/2020  1:15 PM [facility] HEALTH CLINICIAN [facility]    Charlesetta Garibaldi [place], [place]

## 2020-11-21 NOTE — Patient Instructions (Signed)
Levonorgestrel intrauterine device (IUD) What is this medicine? LEVONORGESTREL IUD (LEE voe nor jes trel) is a contraceptive (birth control) device. The device is placed inside the uterus by a healthcare professional. It is used to prevent pregnancy. This device can also be used to treat heavy bleeding that occurs during your period. This medicine may be used for other purposes; ask your health care provider or pharmacist if you have questions. COMMON BRAND NAME(S): Kyleena, LILETTA, Mirena, Skyla What should I tell my health care provider before I take this medicine? They need to know if you have any of these conditions:  abnormal Pap smear  cancer of the breast, uterus, or cervix  diabetes  endometritis  genital or pelvic infection now or in the past  have more than one sexual partner or your partner has more than one partner  heart disease  history of an ectopic or tubal pregnancy  immune system problems  IUD in place  liver disease or tumor  problems with blood clots or take blood-thinners  seizures  use intravenous drugs  uterus of unusual shape  vaginal bleeding that has not been explained  an unusual or allergic reaction to levonorgestrel, other hormones, silicone, or polyethylene, medicines, foods, dyes, or preservatives  pregnant or trying to get pregnant  breast-feeding How should I use this medicine? This device is placed inside the uterus by a health care professional. Talk to your pediatrician regarding the use of this medicine in children. Special care may be needed. Overdosage: If you think you have taken too much of this medicine contact a poison control center or emergency room at once. NOTE: This medicine is only for you. Do not share this medicine with others. What if I miss a dose? This does not apply. Depending on the brand of device you have inserted, the device will need to be replaced every 3 to 6 years if you wish to continue using this type  of birth control. What may interact with this medicine? Do not take this medicine with any of the following medications:  amprenavir  bosentan  fosamprenavir This medicine may also interact with the following medications:  aprepitant  armodafinil  barbiturate medicines for inducing sleep or treating seizures  bexarotene  boceprevir  griseofulvin  medicines to treat seizures like carbamazepine, ethotoin, felbamate, oxcarbazepine, phenytoin, topiramate  modafinil  pioglitazone  rifabutin  rifampin  rifapentine  some medicines to treat HIV infection like atazanavir, efavirenz, indinavir, lopinavir, nelfinavir, tipranavir, ritonavir  St. John's wort  warfarin This list may not describe all possible interactions. Give your health care provider a list of all the medicines, herbs, non-prescription drugs, or dietary supplements you use. Also tell them if you smoke, drink alcohol, or use illegal drugs. Some items may interact with your medicine. What should I watch for while using this medicine? Visit your doctor or health care professional for regular check ups. See your doctor if you or your partner has sexual contact with others, becomes HIV positive, or gets a sexual transmitted disease. This product does not protect you against HIV infection (AIDS) or other sexually transmitted diseases. You can check the placement of the IUD yourself by reaching up to the top of your vagina with clean fingers to feel the threads. Do not pull on the threads. It is a good habit to check placement after each menstrual period. Call your doctor right away if you feel more of the IUD than just the threads or if you cannot feel the threads at   all. The IUD may come out by itself. You may become pregnant if the device comes out. If you notice that the IUD has come out use a backup birth control method like condoms and call your health care provider. Using tampons will not change the position of the  IUD and are okay to use during your period. This IUD can be safely scanned with magnetic resonance imaging (MRI) only under specific conditions. Before you have an MRI, tell your healthcare provider that you have an IUD in place, and which type of IUD you have in place. What side effects may I notice from receiving this medicine? Side effects that you should report to your doctor or health care professional as soon as possible:  allergic reactions like skin rash, itching or hives, swelling of the face, lips, or tongue  fever, flu-like symptoms  genital sores  high blood pressure  no menstrual period for 6 weeks during use  pain, swelling, warmth in the leg  pelvic pain or tenderness  severe or sudden headache  signs of pregnancy  stomach cramping  sudden shortness of breath  trouble with balance, talking, or walking  unusual vaginal bleeding, discharge  yellowing of the eyes or skin Side effects that usually do not require medical attention (report to your doctor or health care professional if they continue or are bothersome):  acne  breast pain  change in sex drive or performance  changes in weight  cramping, dizziness, or faintness while the device is being inserted  headache  irregular menstrual bleeding within first 3 to 6 months of use  nausea This list may not describe all possible side effects. Call your doctor for medical advice about side effects. You may report side effects to FDA at 1-800-FDA-1088. Where should I keep my medicine? This does not apply. NOTE: This sheet is a summary. It may not cover all possible information. If you have questions about this medicine, talk to your doctor, pharmacist, or health care provider.  2020 Elsevier/Gold Standard (2018-10-05 13:22:01)  

## 2020-11-23 LAB — AFP, SERUM, OPEN SPINA BIFIDA
AFP MoM: 1.01
AFP Value: 45.5 ng/mL
Gest. Age on Collection Date: 15.2 weeks
Maternal Age At EDD: 26.2 yr
OSBR Risk 1 IN: 10000
Test Results:: NEGATIVE
Weight: 98 [lb_av]

## 2020-11-27 ENCOUNTER — Telehealth: Payer: Self-pay

## 2020-11-27 NOTE — Telephone Encounter (Signed)
Called pt to verify she has received Makena injectors. Reports she has not heard from pharmacy since day of last visit with our office. Called Makena and spoke with representative Revonda Standard who states I can follow up with [employer]. Call was transferred to pharmacy, not currently open. Called pt back and provided name and number for pharmacy; requested she call to verify address tomorrow and to contact the office as soon as she receives injectors.

## 2020-12-05 NOTE — BH Specialist Note (Deleted)
Integrated Behavioral Health Initial In-Person Visit  MRN: [id number] Name: Julia Roman  Number of Integrated Behavioral Health Clinician visits:: {IBH Number of Visits:21014052} Session Start time: 1:15***  Session End time: 2:!5*** Total time: {IBH Total Time:21014050} minutes  Types of Service: Individual psychotherapy *** Interpretor:No. Interpretor Name and Language: n/a   Warm Hand Off Completed.       Subjective: Julia Roman is a 24 y.o. female accompanied by {CHL AMB ACCOMPANIED UQ:[id number]} Patient was referred by Mariel Aloe, MD for depression. Patient reports the following symptoms/concerns: Pt states her primary concern today is *** Duration of problem: ***; Severity of problem: {Mild/Moderate/Severe:20260}  Objective: Mood: {BHH MOOD:22306} and Affect: {BHH AFFECT:22307} Risk of harm to self or others: {CHL AMB BH Suicide Current Mental Status:21022748}  Life Context: Family and Social: *** School/Work: *** Self-Care: *** Life Changes: Current pregnancy; ***  Patient and/or Family's Strengths/Protective Factors: {CHL AMB BH PROTECTIVE FACTORS:[phone]}  Goals Addressed: Patient will: 1. Reduce symptoms of: {IBH Symptoms:21014056} 2. Increase knowledge and/or ability of: {IBH Patient Tools:21014057}  3. Demonstrate ability to: {IBH Goals:21014053}  Progress towards Goals: {CHL AMB BH PROGRESS TOWARDS [phone]}  Interventions: Interventions utilized: {IBH Interventions:21014054}  Standardized Assessments completed: {IBH Screening Tools:21014051}  Patient and/or Family Response: Pt agrees to treatment plan ***  Patient Centered Plan: Patient is on the following Treatment Plan(s):  IBH  Assessment: Patient currently experiencing ***.   Patient may benefit from psychoeducation and brief therapeutic interventions regarding coping with symptoms of *** .  Plan: 1. Follow up with behavioral health clinician on : *** 2. Behavioral  recommendations:  -*** -*** 3. Referral(s): {IBH Referrals:21014055} 4. "From scale of 1-10, how likely are you to follow plan?": ***  Rae Lips, LCSW  Depression screen [facility] 2/9 11/21/2020 10/22/2020 05/16/2019 02/14/2019 01/17/2019  Decreased Interest 1 1 1 2 2   Down, Depressed, Hopeless 1 2 0 1 1  PHQ - 2 Score 2 3 1 3 3   Altered sleeping 1 1 0 2 1  Tired, decreased energy 2 3 2 3 2   Change in appetite 2 2 0 1 1  Feeling bad or failure about yourself  2 2 0 0 0  Trouble concentrating 1 1 0 0 0  Moving slowly or fidgety/restless 0 0 0 0 0  Suicidal thoughts 0 0 0 0 0  PHQ-9 Score 10 12 3 9 7    GAD 7 : Generalized Anxiety Score 11/21/2020 10/22/2020 05/16/2019 02/14/2019  Nervous, Anxious, on Edge 1 1 1 1   Control/stop worrying 0 1 0 1  Worry too much - different things 1 1 1  0  Trouble relaxing 1 1 0 1  Restless 0 0 0 0  Easily annoyed or irritable 2 2 3 2   Afraid - awful might happen 0 1 0 0  Total GAD 7 Score 5 7 5 5    ***

## 2020-12-06 ENCOUNTER — Telehealth: Payer: Self-pay | Admitting: *Deleted

## 2020-12-06 NOTE — Telephone Encounter (Signed)
Pt called office and spoke w/Katina Linde Gillis. She reported that she had received her medication and wanted to schedule appointment with nurse for self administration education. Pt was offered appointment today and declined stating that she cannot come to office today. She agreed to appt on Monday 12/10/20.

## 2020-12-10 ENCOUNTER — Observation Stay (HOSPITAL_COMMUNITY)
Admission: AD | Admit: 2020-12-10 | Discharge: 2020-12-11 | Disposition: A | Discharge status: Home / Self Care | Payer: Medicaid Other | Attending: Obstetrics and Gynecology | Admitting: Obstetrics and Gynecology

## 2020-12-10 ENCOUNTER — Ambulatory Visit: Payer: Medicaid Other

## 2020-12-10 DIAGNOSIS — O039 Complete or unspecified spontaneous abortion without complication: Secondary | ICD-10-CM | POA: Diagnosis present

## 2020-12-10 DIAGNOSIS — D573 Sickle-cell trait: Secondary | ICD-10-CM | POA: Diagnosis present

## 2020-12-10 DIAGNOSIS — O99332 Smoking (tobacco) complicating pregnancy, second trimester: Secondary | ICD-10-CM | POA: Insufficient documentation

## 2020-12-10 DIAGNOSIS — O3680X0 Pregnancy with inconclusive fetal viability, not applicable or unspecified: Secondary | ICD-10-CM

## 2020-12-10 DIAGNOSIS — Z20822 Contact with and (suspected) exposure to covid-19: Secondary | ICD-10-CM | POA: Diagnosis not present

## 2020-12-10 DIAGNOSIS — N96 Recurrent pregnancy loss: Secondary | ICD-10-CM

## 2020-12-10 DIAGNOSIS — F17210 Nicotine dependence, cigarettes, uncomplicated: Secondary | ICD-10-CM | POA: Diagnosis not present

## 2020-12-10 DIAGNOSIS — O9902 Anemia complicating childbirth: Secondary | ICD-10-CM | POA: Diagnosis present

## 2020-12-10 DIAGNOSIS — O021 Missed abortion: Secondary | ICD-10-CM | POA: Diagnosis present

## 2020-12-10 DIAGNOSIS — O99334 Smoking (tobacco) complicating childbirth: Secondary | ICD-10-CM | POA: Diagnosis present

## 2020-12-10 DIAGNOSIS — O0990 Supervision of high risk pregnancy, unspecified, unspecified trimester: Secondary | ICD-10-CM

## 2020-12-10 DIAGNOSIS — O3432 Maternal care for cervical incompetence, second trimester: Secondary | ICD-10-CM | POA: Insufficient documentation

## 2020-12-10 DIAGNOSIS — Z3A18 18 weeks gestation of pregnancy: Secondary | ICD-10-CM | POA: Insufficient documentation

## 2020-12-10 NOTE — MAU Note (Signed)
Pt to room complaining of urge to push. States she felt needed to have BM-got int the shower and felt something was coming out of her vagina at 2300. Denies SROM or vaginal bleeding. Dark bag noted at introitus. Pt placed in [place]. Nicole Nugent at bedside-discussed plan of care

## 2020-12-11 ENCOUNTER — Encounter (HOSPITAL_COMMUNITY): Payer: Self-pay | Admitting: Obstetrics and Gynecology

## 2020-12-11 ENCOUNTER — Other Ambulatory Visit (HOSPITAL_COMMUNITY): Payer: Medicaid Other

## 2020-12-11 ENCOUNTER — Inpatient Hospital Stay (HOSPITAL_BASED_OUTPATIENT_CLINIC_OR_DEPARTMENT_OTHER): Payer: Medicaid Other

## 2020-12-11 DIAGNOSIS — O3680X0 Pregnancy with inconclusive fetal viability, not applicable or unspecified: Secondary | ICD-10-CM

## 2020-12-11 DIAGNOSIS — O3432 Maternal care for cervical incompetence, second trimester: Secondary | ICD-10-CM

## 2020-12-11 DIAGNOSIS — O039 Complete or unspecified spontaneous abortion without complication: Secondary | ICD-10-CM | POA: Diagnosis not present

## 2020-12-11 DIAGNOSIS — Z3A18 18 weeks gestation of pregnancy: Secondary | ICD-10-CM

## 2020-12-11 DIAGNOSIS — O0990 Supervision of high risk pregnancy, unspecified, unspecified trimester: Secondary | ICD-10-CM

## 2020-12-11 DIAGNOSIS — N96 Recurrent pregnancy loss: Secondary | ICD-10-CM

## 2020-12-11 DIAGNOSIS — O4102X0 Oligohydramnios, second trimester, not applicable or unspecified: Secondary | ICD-10-CM | POA: Diagnosis not present

## 2020-12-11 DIAGNOSIS — O034 Incomplete spontaneous abortion without complication: Secondary | ICD-10-CM

## 2020-12-11 DIAGNOSIS — O321XX0 Maternal care for breech presentation, not applicable or unspecified: Secondary | ICD-10-CM

## 2020-12-11 LAB — COMPREHENSIVE METABOLIC PANEL
ALT: 10 U/L (ref 0–44)
AST: 16 U/L (ref 15–41)
Albumin: 3.5 g/dL (ref 3.5–5.0)
Alkaline Phosphatase: 57 U/L (ref 38–126)
Anion gap: 9 (ref 5–15)
BUN: 6 mg/dL (ref 6–20)
CO2: 22 mmol/L (ref 22–32)
Calcium: 8.9 mg/dL (ref 8.9–10.3)
Chloride: 102 mmol/L (ref 98–111)
Creatinine, Ser: 0.6 mg/dL (ref 0.44–1.00)
GFR, Estimated: >60 mL/min (ref >60)
Glucose, Bld: 82 mg/dL (ref 70–99)
Potassium: 3 mmol/L — ABNORMAL LOW (ref 3.5–5.1)
Sodium: 133 mmol/L — ABNORMAL LOW (ref 135–145)
Total Bilirubin: 0.6 mg/dL (ref 0.3–1.2)
Total Protein: 7.3 g/dL (ref 6.5–8.1)

## 2020-12-11 LAB — CBC
HCT: 34.7 % — ABNORMAL LOW (ref 36.0–46.0)
Hemoglobin: 12.1 g/dL (ref 12.0–15.0)
MCH: 26.1 pg (ref 26.0–34.0)
MCHC: 34.9 g/dL (ref 30.0–36.0)
MCV: 74.8 fL — ABNORMAL LOW (ref 80.0–100.0)
Platelets: 304 10*3/uL (ref 150–400)
RBC: 4.64 MIL/uL (ref 3.87–5.11)
RDW: 16.2 % — ABNORMAL HIGH (ref 11.5–15.5)
WBC: 12.2 10*3/uL — ABNORMAL HIGH (ref 4.0–10.5)
nRBC: 0 % (ref 0.0–0.2)

## 2020-12-11 LAB — RPR: RPR Ser Ql: NONREACTIVE

## 2020-12-11 LAB — ABO/RH: ABO/RH(D): AB POS

## 2020-12-11 LAB — RESP PANEL BY RT-PCR (FLU A&B, COVID) ARPGX2
Influenza A by PCR: NEGATIVE
Influenza B by PCR: NEGATIVE
SARS Coronavirus 2 by RT PCR: NEGATIVE

## 2020-12-11 MED ORDER — IBUPROFEN 600 MG PO TABS
600.0000 mg | ORAL_TABLET | Freq: Three times a day (TID) | ORAL | Status: DC | PRN
  Administered 2020-12-11 (×2): 600 mg via ORAL
  Filled 2020-12-11 (×2): qty 1

## 2020-12-11 MED ORDER — LACTATED RINGERS IV SOLN
INTRAVENOUS | Status: DC
  Administered 2020-12-10: 1000 mL via INTRAVENOUS

## 2020-12-11 MED ORDER — IBUPROFEN 600 MG PO TABS: 600.0000 mg | ORAL_TABLET | Freq: Three times a day (TID) | ORAL | 3 refills | Status: DC | PRN

## 2020-12-11 MED ORDER — MISOPROSTOL 200 MCG PO TABS
200.0000 ug | ORAL_TABLET | Freq: Once | ORAL | Status: AC
  Administered 2020-12-11: 200 ug via ORAL
  Filled 2020-12-11: qty 1

## 2020-12-11 MED ORDER — SOD CITRATE-CITRIC ACID 500-334 MG/5ML PO SOLN: 30.0000 mL | ORAL | Status: DC | PRN

## 2020-12-11 MED ORDER — OXYCODONE-ACETAMINOPHEN 5-325 MG PO TABS
1.0000 | ORAL_TABLET | ORAL | Status: DC | PRN
Start: 2020-12-11 — End: 2020-12-11

## 2020-12-11 MED ORDER — OXYTOCIN-SODIUM CHLORIDE 30-0.9 UT/500ML-% IV SOLN
2.5000 [IU]/h | INTRAVENOUS | Status: DC
  Administered 2020-12-11: 2.5 [IU]/h via INTRAVENOUS

## 2020-12-11 MED ORDER — OXYTOCIN BOLUS FROM INFUSION
333.0000 mL | Freq: Once | INTRAVENOUS | Status: AC
  Administered 2020-12-11: 333 mL via INTRAVENOUS

## 2020-12-11 MED ORDER — ONDANSETRON HCL 4 MG/2ML IJ SOLN: 4.0000 mg | Freq: Four times a day (QID) | INTRAMUSCULAR | Status: DC | PRN

## 2020-12-11 MED ORDER — OXYCODONE-ACETAMINOPHEN 5-325 MG PO TABS: 2.0000 | ORAL_TABLET | ORAL | Status: DC | PRN

## 2020-12-11 MED ORDER — FENTANYL CITRATE (PF) 100 MCG/2ML IJ SOLN
100.0000 ug | INTRAMUSCULAR | Status: DC | PRN
  Administered 2020-12-11 (×2): 100 ug via INTRAVENOUS
  Filled 2020-12-11 (×2): qty 2

## 2020-12-11 MED ORDER — LIDOCAINE HCL (PF) 1 % IJ SOLN: 30.0000 mL | INTRAMUSCULAR | Status: DC | PRN

## 2020-12-11 MED ORDER — ACETAMINOPHEN 325 MG PO TABS: 650.0000 mg | ORAL_TABLET | ORAL | Status: DC | PRN

## 2020-12-11 MED ORDER — LACTATED RINGERS IV SOLN
500.0000 mL | INTRAVENOUS | Status: DC | PRN
Start: 2020-12-11 — End: 2020-12-11

## 2020-12-11 MED ORDER — ACETAMINOPHEN 325 MG PO TABS: 650.0000 mg | ORAL_TABLET | Freq: Four times a day (QID) | ORAL | Status: DC | PRN

## 2020-12-11 MED ORDER — ACETAMINOPHEN 325 MG PO TABS
650.0000 mg | ORAL_TABLET | Freq: Four times a day (QID) | ORAL | Status: DC | PRN
  Administered 2020-12-11 (×2): 650 mg via ORAL
  Filled 2020-12-11 (×2): qty 2

## 2020-12-11 MED ORDER — OXYTOCIN-SODIUM CHLORIDE 30-0.9 UT/500ML-% IV SOLN
INTRAVENOUS | Status: AC
  Filled 2020-12-11: qty 500

## 2020-12-11 NOTE — Progress Notes (Signed)
Called to bedside by nurse given pt endorsing increased vaginal pressure. Bulging bag visible at introitus. IV fentanyl administered. Coached pt in pushing at which time fetal legs and trunk delivered en caul s/p 1 push with subsequent ROM. Given head entrapment, Dr. Alysia Penna notified of plan to administer buccal cytotec . Reassured pt and explained the plan to administer cytotec to help the cervix relax. Pt reports understanding of plan. Nursing aware. MD will re-assess in several hours or sooner as clinically indicated.  Sheila Oats, MD OB Fellow, Faculty Practice 12/11/2020 2:42 AM

## 2020-12-11 NOTE — H&P (Addendum)
[facility] [name] is a 25 y.o. female [phone] with IUP at 51w1dby 8 week ultrasound presenting for concern of preterm labor. She reports No LOF, no VB, no blurry vision, headaches or peripheral edema, and RUQ pain. Pt was feeling increased vaginal pressure all day. Immediately before coming to MAU, she was on the toilet and felt she needed to have a bowel movement at which time she felt a "bulge coming through into the vagina". Pregnancy was desired. Of note, pt has h/o preterm delivery at 234w2d [facility] Dating: By 8 week ultrasund --->  Estimated Date of Delivery: 05/13/21  Sono:  '@[redacted]w[redacted]d' , CWD, normal anatomy, breech presentation, posterior placenta, cervical dilation   Prenatal History/Complications:  - h/o preterm delivery at 2544w2dt was scheduled for first dose of Makena on 12/10/20) - tobacco use, marijuana use - sickle cell trait  Past Medical History: Past Medical History:  Diagnosis Date  . Anemia   . Chlamydia 2010  . Depression AGE 83   NO MEDS CURRENTLY  . Gonorrhea 2010  . Headache   . Ovarian cyst 2013  . [place] . Trichomonas vaginalis infection 4/282015  . Urinary tract infection    OCC    Past Surgical History: Past Surgical History:  Procedure Laterality Date  . NO PAST SURGERIES      Obstetrical History: OB History    Gravida  6   Para  3   Term  2   Preterm  1   AB  2   Living  3     SAB  1   IAB  1   Ectopic      Multiple      Live Births  3           Social History Social History   Socioeconomic History  . Marital status: Single    Spouse name: Not on file  . Number of children: Not on file  . Years of education: 11+  . Highest education level: Not on file  Occupational History  . Occupation: STUDENT    Employer: NOT EMPLOYED  Tobacco Use  . Smoking status: Current Every Day Smoker    Packs/day: 0.25    Types: Cigarettes  .  Smokeless tobacco: Never Used  Vaping Use  . Vaping Use: Never used  Substance and Sexual Activity  . Alcohol use: Not Currently    Comment: 1 times a week  . Drug use: Yes    Frequency: 7.0 times per week    Types: Marijuana    Comment: daily  . Sexual activity: Yes    Partners: Male    Birth control/protection: None  Other Topics Concern  . Not on file  Social History Narrative  . Not on file   Social Determinants of Health   Financial Resource Strain: Not on file  Food Insecurity: Not on file  Transportation Needs: Not on file  Physical Activity: Not on file  Stress: Not on file  Social Connections: Not on file    Family History: Family History  Problem Relation Age of Onset  . Asthma Mother   . Fibromyalgia Mother   . Asthma Brother   . Arthritis Maternal Grandfather   . Diabetes Maternal Grandfather   . Hypertension Maternal Grandfather   . Kidney disease Maternal Grandfather   . Stroke Maternal Grandfather   . Birth defects Cousin  CLEFT LIP  . Cancer Cousin   . Cancer Maternal Aunt        MOUTH  . Hypertension Maternal Aunt   . Heart disease Maternal Grandmother   . Hyperlipidemia Maternal Grandmother   . Hypertension Maternal Grandmother   . Diabetes Paternal Grandmother   . Hypertension Maternal Aunt     Allergies: Allergies  Allergen Reactions  . Tramadol Hives and Rash    Entire body rash    Medications Prior to Admission  Medication Sig Dispense Refill Last Dose  . Prenatal Vit-Fe Fum-FA-Omega (PRENATAL FORMULA) 28-0.8-235 MG CAPS Take 1 capsule by mouth daily. 30 capsule 8 12/10/2020 at Unknown time  . acetaminophen (TYLENOL) 325 MG tablet Take 2 tablets (650 mg total) by mouth every 4 (four) hours as needed (for pain scale < 4). (Patient not taking: No sig reported) 30 tablet 0   . Blood Pressure Monitoring (BLOOD PRESSURE KIT) DEVI 1 Device by Does not apply route daily. ICD 10: Z34.00 (Patient not taking: Reported on 11/21/2020) 1  each 0   . MAKENA autoinjector Inject into the skin.        Review of Systems   All systems reviewed and negative except as stated in HPI  Blood pressure 130/72, pulse 72, temperature 97.9 F (36.6 C), temperature source Oral, resp. rate 17, height '5\' 2"'  (1.575 m), weight 44.5 kg, last menstrual period 03/13/2020, SpO2 100 %, unknown if currently breastfeeding. General appearance: alert, cooperative and appears stated age Lungs: normal WOB Heart: regular rate Abdomen: soft, non-tender Extremities: no sign of DVT Presentation: breech on ultrasound Fetal monitoring: FHT 180 on formal bedside ultrasound  Cervical exam: bulging membranes visible at introitus on arrival  Prenatal labs: ABO, Rh: --/--/AB POS (01/03 2356) Antibody: Negative (11/15 1058) Rubella: 2.14 (11/15 1058) RPR: Non Reactive (11/15 1058)  HBsAg: Negative (11/15 1058)  HIV: Non Reactive (11/15 1058)  GBS:   unknown 1 hr Glucola unknown Genetic screening wnl Anatomy US not yet performed  Results for orders placed or performed during the hospital encounter of 12/10/20 (from the past 24 hour(s))  CBC   Collection Time: 12/10/20 11:55 PM  Result Value Ref Range   WBC 12.2 (H) 4.0 - 10.5 K/uL   RBC 4.64 3.87 - 5.11 MIL/uL   Hemoglobin 12.1 12.0 - 15.0 g/dL   HCT 34.7 (L) 36.0 - 46.0 %   MCV 74.8 (L) 80.0 - 100.0 fL   MCH 26.1 26.0 - 34.0 pg   MCHC 34.9 30.0 - 36.0 g/dL   RDW 16.2 (H) 11.5 - 15.5 %   Platelets 304 150 - 400 K/uL   nRBC 0.0 0.0 - 0.2 %  ABO/Rh   Collection Time: 12/10/20 11:56 PM  Result Value Ref Range   ABO/RH(D) AB POS    No rh immune globuloin      NOT A RH IMMUNE GLOBULIN CANDIDATE, PT RH POSITIVE Performed at [facility], [address], [place], [place] [address]     Patient Active Problem List   Diagnosis Date Noted  . Supervision of high risk pregnancy, antepartum 10/22/2020  . [redacted] weeks gestation of pregnancy 10/22/2020  . Sickle cell trait ([place])   . Non-compliant  pregnant patient 05/16/2019  . Pruritic urticarial papules and plaques of pregnancy 05/16/2019  . Trichomonal vaginitis during pregnancy 01/19/2019  . History of preterm delivery 04/21/2014  . Tobacco use disorder affecting pregnancy, antepartum 04/05/2014  . Current smoker 02/03/2013  . Marijuana use 02/03/2013  . History  of depression 02/03/2013  . 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 V2O3009 at 8w1dhere for management of inevitable miscarriage.  #Inevitable miscarriage of Desired Pregnancy: Pt presenting with visible bulging membranes at introitus. Formal ultrasound confirmed cervical dilation in MAU. Most likely secondary to cervical incompetence. PPROM is also possible given low AFI on formal ultrasound but bulging membranes also noted on exam. Intermittent urge to push. [facility] - will manage expectantly - prn IV fentanyl for pain - discussed option of genetic testing; pt still undecided - Spiritual & SW Consults s/p delivery  Cheryllynn Sarff, AGildardo Cranker MD OB Fellow, Faculty Practice 12/11/2020 1:39 AM

## 2020-12-11 NOTE — Discharge Instructions (Signed)
Managing Pregnancy Loss Pregnancy loss can happen any time during a pregnancy. Often the cause is not known. It is rarely because of anything you did. Pregnancy loss in early pregnancy (during the first trimester) is called a miscarriage. This type of pregnancy loss is the most common. Pregnancy loss that happens after 20 weeks of pregnancy is called fetal demise if the baby's heart stops beating before birth. Fetal demise is much less common. Some women experience spontaneous labor shortly after fetal demise resulting in a stillborn birth (stillbirth). Any pregnancy loss can be devastating. You will need to recover both physically and emotionally. Most women are able to get pregnant again after a pregnancy loss and deliver a healthy baby. How to manage emotional recovery  Pregnancy loss is very hard emotionally. You may feel many different emotions while you grieve. You may feel sad and angry. You may also feel guilty. It is normal to have periods of crying. Emotional recovery can take longer than physical recovery. It is different for everyone. Taking these steps can help you in managing this loss:  Remember that it is unlikely you did anything to cause the pregnancy loss.  Share your thoughts and feelings with friends, family, and your partner. Remember that your partner is also recovering emotionally.  Make sure you have a good support system. Do not spend too much time alone.  Meet with a pregnancy loss counselor or join a pregnancy loss support group.  Get enough sleep and eat a healthy diet. Return to regular exercise when you have recovered physically.  Do not use drugs or alcohol to manage your emotions.  Consider seeing a mental health professional to help you recover emotionally.  Ask a friend or loved one to help you decide what to do with any clothing and nursery items you received for your baby. In the case of a stillbirth, many women benefit from taking additional steps in the  grieving process. You may want to:  Hold your baby after the birth.  Name your baby.  Request a birth certificate.  Create a keepsake such as handprints or footprints.  Dress your baby and have a picture taken.  Make funeral arrangements.  Ask for a baptism or blessing. Hospitals have staff members who can help you with all these arrangements. How to recognize emotional stress It is normal to have emotional stress after a pregnancy loss. But emotional stress that lasts a long time or becomes severe requires treatment. Watch out for these signs of severe emotional stress:  Sadness, anger, or guilt that is not going away and is interfering with your normal activities.  Relationship problems that have occurred or gotten worse since the pregnancy loss.  Signs of depression that last longer than 2 weeks. These may include: ? Sadness. ? Anxiety. ? Hopelessness. ? Loss of interest in activities you enjoy. ? Inability to concentrate. ? Trouble sleeping or sleeping too much. ? Loss of appetite or overeating. ? Thoughts of death or of hurting yourself. Follow these instructions at home:  Take over-the-counter and prescription medicines only as told by your health care provider.  Rest at home until your energy level returns. Return to your normal activities as told by your health care provider. Ask your health care provider what activities are safe for you.  When you are ready, meet with your health care provider to discuss steps to take for a future pregnancy.  Keep all follow-up visits as told by your health care provider. This is important.   Where to find support  To help you and your partner with the process of grieving, talk with your health care provider or seek counseling.  Consider meeting with others who have experienced pregnancy loss. Ask your health care provider about support groups and resources. Where to find more information  U.S. Department of Health and Human  Services Office on Women's Health: www.womenshealth.gov  American Pregnancy Association: www.americanpregnancy.org Contact a health care provider if:  You continue to experience grief, sadness, or lack of motivation for everyday activities, and those feelings do not improve over time.  You are struggling to recover emotionally, especially if you are using alcohol or substances to help. Get help right away if:  You have thoughts of hurting yourself or others. If you ever feel like you may hurt yourself or others, or have thoughts about taking your own life, get help right away. You can go to your nearest emergency department or call:  Your local emergency services (911 in the [place]  A suicide crisis helpline, such as the National Suicide Prevention Lifeline at 1-800-273-8255. This is open 24 hours a day. Summary  Any pregnancy loss can be difficult physically and emotionally.  You may experience many different emotions while you grieve. Emotional recovery can last longer than physical recovery.  It is normal to have emotional stress after a pregnancy loss. But emotional stress that lasts a long time or becomes severe requires treatment.  See your health care provider if you are struggling emotionally after a pregnancy loss. This information is not intended to replace advice given to you by your health care provider. Make sure you discuss any questions you have with your health care provider. Document Revised: 03/16/2019 Document Reviewed: 02/04/2018 Elsevier Patient Education  2020 Elsevier Inc.  

## 2020-12-11 NOTE — Progress Notes (Signed)
POSTPARTUM PROGRESS NOTE  Post Partum Day 0  Subjective:  Julia Roman is a 25 y.o. [phone] s/p IUFD and second trimester misacarriage at [redacted]w[redacted]d.  She reports she is doing well.  She denies any problems with ambulating, voiding or po intake. Denies nausea or vomiting.  Pain is well controlled.  Lochia is appropriate.  She will speak with social work at 1 pm.  Pt currently tired but stable.  Objective: Blood pressure (!) 97/54, pulse 60, temperature 97.9 F (36.6 C), temperature source Oral, resp. rate 16, height 5\' 2"  (1.575 m), weight 44.5 kg, last menstrual period 03/13/2020, SpO2 99 %, unknown if currently breastfeeding.  Physical Exam:  General: alert, cooperative and no distress Chest: no respiratory distress Heart:regular rate, distal pulses intact Abdomen: soft, nontender,  Uterine Fundus: firm, appropriately tender DVT Evaluation: No calf swelling or tenderness Extremities: no edema Skin: warm, dry  Recent Labs    12/10/20 2355  HGB 12.1  HCT 34.7*    Assessment/Plan: Julia Roman is a 25 y.o. [phone] s/p second trimester miscarriage at [redacted]w[redacted]d   PPD#0 -  Pt is emotionally appropriate. Will reassess pt this afternoon to see if she would like to be discharged. Incompetent cervix discussed as well as need for cerclage in future pregnancies.  LOS: 0 days   [redacted]w[redacted]d, MD Faculty attending 12/11/2020, 12:04 PM

## 2020-12-11 NOTE — Progress Notes (Signed)
Called to room given nursing report of delivery of fetal head at 0306. Patient cut umbilical cord under direct observation by author. IV fentanyl administered. Placenta delivered intact s/p 1 push with good maternal effort. Succenturiate lobe visualized on placenta, otherwise unremarkable. Placenta sent to pathology. EBL 125 ml. No visible vaginal lacerations. Fundus firm. IV pitocin administered postpartum. Debriefed with pt regarding her experience of labor and delivery. Pt desires to hold baby at this time. Pt desires time to process and rest prior to discharge.  Sheila Oats, MD OB Fellow, Faculty Practice 12/11/2020 3:37 AM

## 2020-12-11 NOTE — MAU Note (Signed)
Ultrasound at bedside-pt feeling increased vaginal pressure and pain
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
## 2020-12-11 NOTE — Discharge Summary (Signed)
Physician Discharge Summary  Patient ID: Julia Roman MRN: [id number] DOB/AGE: 03-21-1996 24 y.o.  Admit date: 12/10/2020 Discharge date: 12/11/2020  Admission Diagnoses:  Incomplete Second Trimester Pregnancy Loss  Discharge Diagnoses:  Principal Problem:   Spontaneous miscarriage   Cervical Insufficiency Active Problems:   Supervision of high-risk pregnancy  Discharged Condition: stable  Hospital Course:  Julia Roman is a 25 year old [id number] who presented at 21w1dwith complaint of vaginal pressure and a "bulge in the vagina". On arrival to MAU, visible bulging membranes at introitus. Formal bedside ultrasound notable for +FHT with breech presentation and dilated cervix. [facility]. At approximately 0230 on 12/11/20, pt endorsed increasing vaginal pressure and fetal legs and trunk delivered en caul with subsequent ROM. Given head entrapment, buccal cytotec was administered and fetal head subsequently delivered at 0306. Intact placenta notable for succenturiate lobe; sent to pathology. EBL 125 ml with administration of postpartum pitocin. SW consult prior to discharge. Plan for 1 week mood check in clinic.  Consults: Social Work & SIT consultant Significant Diagnostic Studies:   Treatments: IV fentanyl prn for labor   Discharge Exam: Blood pressure (!) 96/56, pulse 73, temperature 98.1 F (36.7 C), temperature source Oral, resp. rate 18, height '5\' 2"'  (1.575 m), weight 44.5 kg, last menstrual period 03/13/2020, SpO2 100 %, unknown if currently breastfeeding. General appearance: alert, cooperative and appears stated age Head: Normocephalic, without obvious abnormality, atraumatic Eyes: EOMI Neck: supple Resp: normal WOB Cardio: regular rate GI: soft, nontender, fundus firm and below umbilicus Extremities: extremities normal, atraumatic, no cyanosis or edema Neurologic: Grossly normal  Disposition: Discharge disposition: 01-Home  or Self Care     Home Discharge Instructions    Call MD for:  difficulty breathing, headache or visual disturbances   Complete by: As directed    Call MD for:  extreme fatigue   Complete by: As directed    Call MD for:  hives   Complete by: As directed    Call MD for:  persistant dizziness or light-headedness   Complete by: As directed    Call MD for:  persistant nausea and vomiting   Complete by: As directed    Call MD for:  severe uncontrolled pain   Complete by: As directed    Call MD for:  temperature >100.4   Complete by: As directed    Diet general   Complete by: As directed    Increase activity slowly   Complete by: As directed      Allergies as of 12/11/2020      Reactions   Tramadol Hives, Rash   Entire body rash      Medication List    STOP taking these medications   Blood Pressure Kit Devi   Makena autoinjector Generic drug: HYDROXYprogesterone caproate     TAKE these medications   acetaminophen 325 MG tablet Commonly known as: TYLENOL Take 2 tablets (650 mg total) by mouth every 6 (six) hours as needed for mild pain, moderate pain, fever or headache (for pain scale < 4ORtemperature>/=100.5 F). What changed:   when to take this  reasons to take this   ibuprofen 600 MG tablet Commonly known as: ADVIL Take 1 tablet (600 mg total) by mouth every 8 (eight) hours as needed for moderate pain or cramping.   Prenatal Formula 28-0.8-235 MG Caps Take 1 capsule by mouth daily.      Signed:Mora Bellman MD 12/11/2020 5:37 PM

## 2020-12-11 NOTE — Progress Notes (Signed)
CSW followed up with chaplin who reported that MOB needs to meet with CSW. CSW agreed to follow up with MOB.   CSW attempted to meet with MOB at bedside; however, MOB reported that she was just falling asleep and requested that CSW come back at a later time. CSW agreed to come back around 1pm, MOB agreeable.   Celso Sickle, LCSW Clinical Social Worker [facility] Cell#: [phone]

## 2020-12-11 NOTE — MAU Note (Signed)
Dr.Goswick at bedside for evaluation

## 2020-12-11 NOTE — Progress Notes (Signed)
Chaplain was referred to patient by [facility]. When chaplain entered the room pt was in her bed holding her daughter and crying. Chaplain spent time throughout the day offering grief support and life review. Pt has three other children and many external stressors. Pt also endorses a history of depression, but is not currently taking medication. Chaplain helped pt coordinate arrangements with Melynda Ripple and apply for financial assistance for cremation, which was secured and will be paid directly to the funeral home on patient's behalf. Chaplain made referrals to [facility] for additional support and notified pt RN that pt will benefit from an integrated behavioral health visit at her OB follow up. Chaplain also counseled pt on how to support her children through their grief.   Please page as further needs arise.  Maryanna Shape. Carley Hammed, M.Div. [facility] Chaplain Pager [phone] Office [phone]      12/11/20 1727  Clinical Encounter Type  Visited With Patient  Visit Type Psychological support;Spiritual support  Referral From Nurse  Spiritual Encounters  Spiritual Needs Emotional;Grief support  Stress Factors  Patient Stress Factors Loss

## 2020-12-11 NOTE — Progress Notes (Signed)
CSW received consult due to IUFD.  CSW available for support secondary to Spiritual Care Services and will await call from Chaplain before becoming involved.  CSW screening out referral at this time.  Sheranda Seabrooks, LCSW Clinical Social Worker [facility] Cell#: [phone]  

## 2020-12-13 ENCOUNTER — Encounter (HOSPITAL_COMMUNITY): Payer: Self-pay | Admitting: Emergency Medicine

## 2020-12-13 ENCOUNTER — Emergency Department (HOSPITAL_COMMUNITY)
Admission: EM | Admit: 2020-12-13 | Discharge: 2020-12-13 | Disposition: A | Discharge status: Home / Self Care | Payer: Medicaid Other | Attending: Emergency Medicine | Admitting: Emergency Medicine

## 2020-12-13 ENCOUNTER — Other Ambulatory Visit: Payer: Self-pay

## 2020-12-13 ENCOUNTER — Institutional Professional Consult (permissible substitution): Payer: Medicaid Other

## 2020-12-13 DIAGNOSIS — R519 Headache, unspecified: Secondary | ICD-10-CM | POA: Diagnosis not present

## 2020-12-13 DIAGNOSIS — M549 Dorsalgia, unspecified: Secondary | ICD-10-CM | POA: Insufficient documentation

## 2020-12-13 DIAGNOSIS — Z5321 Procedure and treatment not carried out due to patient leaving prior to being seen by health care provider: Secondary | ICD-10-CM | POA: Diagnosis not present

## 2020-12-13 DIAGNOSIS — R109 Unspecified abdominal pain: Secondary | ICD-10-CM | POA: Diagnosis not present

## 2020-12-13 LAB — COMPREHENSIVE METABOLIC PANEL
ALT: 16 U/L (ref 0–44)
AST: 23 U/L (ref 15–41)
Albumin: 3.7 g/dL (ref 3.5–5.0)
Alkaline Phosphatase: 59 U/L (ref 38–126)
Anion gap: 12 (ref 5–15)
BUN: <5 mg/dL — ABNORMAL LOW (ref 6–20)
CO2: 23 mmol/L (ref 22–32)
Calcium: 9 mg/dL (ref 8.9–10.3)
Chloride: 104 mmol/L (ref 98–111)
Creatinine, Ser: 0.73 mg/dL (ref 0.44–1.00)
GFR, Estimated: >60 mL/min (ref >60)
Glucose, Bld: 86 mg/dL (ref 70–99)
Potassium: 3.4 mmol/L — ABNORMAL LOW (ref 3.5–5.1)
Sodium: 139 mmol/L (ref 135–145)
Total Bilirubin: 0.5 mg/dL (ref 0.3–1.2)
Total Protein: 7.2 g/dL (ref 6.5–8.1)

## 2020-12-13 LAB — CBC
HCT: 31.9 % — ABNORMAL LOW (ref 36.0–46.0)
Hemoglobin: 11.1 g/dL — ABNORMAL LOW (ref 12.0–15.0)
MCH: 26.2 pg (ref 26.0–34.0)
MCHC: 34.8 g/dL (ref 30.0–36.0)
MCV: 75.4 fL — ABNORMAL LOW (ref 80.0–100.0)
Platelets: 257 10*3/uL (ref 150–400)
RBC: 4.23 MIL/uL (ref 3.87–5.11)
RDW: 15.9 % — ABNORMAL HIGH (ref 11.5–15.5)
WBC: 7.6 10*3/uL (ref 4.0–10.5)
nRBC: 0 % (ref 0.0–0.2)

## 2020-12-13 LAB — LIPASE, BLOOD: Lipase: 29 U/L (ref 11–51)

## 2020-12-13 NOTE — ED Triage Notes (Signed)
Pt POV reports miscarriage at 18w on 12/12/19.  Reports some ongoing bleeding, not a lot.  C/o abdominal pain, back pain and headache starting last night. Denies emesis or diarrhea.  Denies fever.

## 2020-12-13 NOTE — ED Notes (Signed)
Patient stated she did not want her vital sign recheck she was about to go

## 2020-12-14 LAB — SURGICAL PATHOLOGY

## 2020-12-19 ENCOUNTER — Ambulatory Visit: Payer: Medicaid Other

## 2020-12-19 ENCOUNTER — Encounter: Payer: Medicaid Other | Admitting: Advanced Practice Midwife

## 2021-01-10 ENCOUNTER — Ambulatory Visit: Payer: Medicaid Other | Admitting: Obstetrics and Gynecology

## 2021-04-13 ENCOUNTER — Encounter (HOSPITAL_COMMUNITY): Payer: Self-pay | Admitting: Emergency Medicine

## 2021-04-13 ENCOUNTER — Ambulatory Visit (HOSPITAL_COMMUNITY)
Admission: EM | Admit: 2021-04-13 | Discharge: 2021-04-13 | Disposition: A | Discharge status: Home / Self Care | Payer: Medicaid Other | Attending: Physician Assistant | Admitting: Physician Assistant

## 2021-04-13 ENCOUNTER — Other Ambulatory Visit: Payer: Self-pay

## 2021-04-13 ENCOUNTER — Ambulatory Visit (INDEPENDENT_AMBULATORY_CARE_PROVIDER_SITE_OTHER): Payer: Medicaid Other

## 2021-04-13 DIAGNOSIS — R936 Abnormal findings on diagnostic imaging of limbs: Secondary | ICD-10-CM

## 2021-04-13 DIAGNOSIS — R2231 Localized swelling, mass and lump, right upper limb: Secondary | ICD-10-CM | POA: Diagnosis not present

## 2021-04-13 DIAGNOSIS — M79644 Pain in right finger(s): Secondary | ICD-10-CM

## 2021-04-13 DIAGNOSIS — M7989 Other specified soft tissue disorders: Secondary | ICD-10-CM | POA: Diagnosis not present

## 2021-04-13 MED ORDER — NAPROXEN 500 MG PO TABS: 500.0000 mg | ORAL_TABLET | Freq: Two times a day (BID) | ORAL | 0 refills | Status: DC

## 2021-04-13 NOTE — Discharge Instructions (Addendum)
There was no fracture noted.  They did see some for metal bodies in your hand but not where you are having any pain.  Please use Naprosyn for pain relief.  You should not use additional NSAIDs including aspirin, ibuprofen/Advil, naproxen/Aleve with this medication as it can cause stomach bleeding.  You can use Tylenol for additional pain relief.  Please follow-up with hand specialist as we discussed.  If anything worsens please return for reevaluation.

## 2021-04-13 NOTE — ED Triage Notes (Signed)
Pt is present today with right thumb swelling. Pt states that she felt like her thumb was jammed and she tried to pull it and she may have pulled it the wrong the wrong way. Pt states that she noticed the pain a couple days ago

## 2021-04-13 NOTE — ED Provider Notes (Signed)
[facility]    CSN: [id number] Arrival date & time: 04/13/21  1736      History   Chief Complaint Chief Complaint  Patient presents with  . thumb pain     HPI Julia Roman is a 25 y.o. female.   Patient presents today with a several day history of discomfort in her right first MCP.  Denies injury prior to symptom onset.  She does report using her thumb regularly as she is often texting and wonders if this could be contributing to symptoms.  She reports pain is rated 2 on a 0-10 pain scale, localized to first MCP with radiation throughout thumb, described as aching/stiffness, no aggravating relieving factors identified.  She has tried ibuprofen without improvement.  Denies any numbness or paresthesias.  She has been popping affected joint with improvement but not resolution of symptoms.  She is right-handed and symptoms are interfering with ability perform daily activities prompting evaluation today.     Past Medical History:  Diagnosis Date  . Anemia   . Chlamydia 2010  . Depression AGE 76   NO MEDS CURRENTLY  . Gonorrhea 2010  . Headache   . Ovarian cyst 2013  . Sickle cell trait (HCC)   . Trichomonas vaginalis infection 4/282015  . Urinary tract infection    OCC    Patient Active Problem List   Diagnosis Date Noted  . Supervision of high-risk pregnancy 12/11/2020  . Recurrent pregnancy loss 12/11/2020  . Supervision of high risk pregnancy, antepartum 10/22/2020  . [redacted] weeks gestation of pregnancy 10/22/2020  . Sickle cell trait (HCC)   . Non-compliant pregnant patient 05/16/2019  . Pruritic urticarial papules and plaques of pregnancy 05/16/2019  . Trichomonal vaginitis during pregnancy 01/19/2019  . History of preterm delivery 04/21/2014  . Tobacco use disorder affecting pregnancy, antepartum 04/05/2014  . Current smoker 02/03/2013  . Marijuana use 02/03/2013  . History of depression 02/03/2013  . 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 NO PAST SURGERIES      OB History    Gravida  6   Para  3   Term  2   Preterm  1   AB  2   Living  3     SAB  1   IAB  1   Ectopic      Multiple      Live Births  3            Home Medications    Prior to Admission medications   Medication Sig Start Date End Date Taking? Authorizing Provider  naproxen (NAPROSYN) 500 MG tablet Take 1 tablet (500 mg total) by mouth 2 (two) times daily. 04/13/21  Yes Jailani Hogans, Noberto Retort, PA-C    Family History Family History  Problem Relation Age of Onset  . Asthma Mother   . Fibromyalgia Mother   . Asthma Brother   . Arthritis Maternal Grandfather   . Diabetes Maternal Grandfather   . Hypertension Maternal Grandfather   . Kidney disease Maternal Grandfather   . Stroke Maternal Grandfather   . Birth defects Cousin        CLEFT LIP  . Cancer Cousin   . Cancer Maternal Aunt        MOUTH  . Hypertension Maternal Aunt   . Heart disease Maternal Grandmother   . Hyperlipidemia Maternal Grandmother   . Hypertension Maternal Grandmother   . Diabetes Paternal Grandmother   .  Hypertension Maternal Aunt     Social History Social History   Tobacco Use  . Smoking status: Current Every Day Smoker    Packs/day: 0.25    Types: Cigarettes  . Smokeless tobacco: Never Used  Vaping Use  . Vaping Use: Never used  Substance Use Topics  . Alcohol use: Not Currently    Comment: 1 times a week  . Drug use: Yes    Frequency: 7.0 times per week    Types: Marijuana    Comment: daily     Allergies   Tramadol   Review of Systems Review of Systems  Constitutional: Positive for activity change. Negative for appetite change, fatigue and fever.  Respiratory: Negative for cough and shortness of breath.   Cardiovascular: Negative for chest pain.  Musculoskeletal: Positive for arthralgias, joint swelling and myalgias.  Neurological: Negative for dizziness, weakness, light-headedness,  numbness and headaches.     Physical Exam Triage Vital Signs ED Triage Vitals  Enc Vitals Group     BP 04/13/21 1758 132/79     Pulse Rate 04/13/21 1758 87     Resp 04/13/21 1758 18     Temp 04/13/21 1758 98.6 F (37 C)     Temp Source 04/13/21 1758 Oral     SpO2 04/13/21 1758 98 %     Weight --      Height --      Head Circumference --      Peak Flow --      Pain Score 04/13/21 1756 2     Pain Loc --      Pain Edu? --      Excl. in GC? --    No data found.  Updated Vital Signs BP 132/79 (BP Location: Right Arm)   Pulse 87   Temp 98.6 F (37 C) (Oral)   Resp 18   LMP 03/25/2021   SpO2 98%   Breastfeeding No   Visual Acuity Right Eye Distance:   Left Eye Distance:   Bilateral Distance:    Right Eye Near:   Left Eye Near:    Bilateral Near:     Physical Exam Vitals reviewed.  Constitutional:      General: She is awake. She is not in acute distress.    Appearance: Normal appearance. She is not ill-appearing.     Comments: Very pleasant female appears stated age in no acute distress  HENT:     Head: Normocephalic and atraumatic.  Cardiovascular:     Rate and Rhythm: Normal rate and regular rhythm.     Pulses:          Radial pulses are 2+ on the right side and 2+ on the left side.     Heart sounds: No murmur heard.     Comments: Capillary refill within 2 seconds Pulmonary:     Effort: Pulmonary effort is normal.     Breath sounds: Normal breath sounds. No wheezing, rhonchi or rales.     Comments: Clear to auscultation bilaterally Abdominal:     General: Bowel sounds are normal.     Palpations: Abdomen is soft.     Tenderness: There is no abdominal tenderness. There is no right CVA tenderness, left CVA tenderness, guarding or rebound.  Musculoskeletal:     Right hand: Swelling and tenderness present. No bony tenderness. Normal range of motion. Normal strength. Normal sensation. There is no disruption of two-point discrimination.     Comments: Right  hand: Swelling of right thumb.  Tenderness palpation at first MCP and along the thenar eminence.  No deformity noted.  Hands neurovascularly intact.  Psychiatric:        Behavior: Behavior is cooperative.      UC Treatments / Results  Labs (all labs ordered are listed, but only abnormal results are displayed) Labs Reviewed - No data to display  EKG   Radiology DG Finger Thumb Right  Result Date: 04/13/2021 CLINICAL DATA:  Right thumb swelling with pain for a few days. No specific acute injury. EXAM: RIGHT THUMB 2+V COMPARISON:  None. FINDINGS: No evidence of acute fracture or dislocation. The joint spaces are preserved. Numerous tiny metallic foreign bodies project over the soft tissues of the proximal 2nd and 3rd digits, incompletely visualized. IMPRESSION: No acute osseous findings. Tiny metallic foreign bodies project over the soft tissues proximal 2nd 3rd digits, etiology and acuity uncertain. Correlate clinically. Electronically Signed   By: Carey Bullocks M.D.   On: 04/13/2021 18:31    Procedures Procedures (including critical care time)  Medications Ordered in UC Medications - No data to display  Initial Impression / Assessment and Plan / UC Course  I have reviewed the triage vital signs and the nursing notes.  Pertinent labs & imaging results that were available during my care of the patient were reviewed by me and considered in my medical decision making (see chart for details).     X-ray showed no osseous findings of the thumb.  There were tiny metal foreign bodies over second and third digit but patient denies any previous injury or wound to this area.  She denies any pain outside of thumb region.  She was given Naprosyn with instruction not to take additional NSAIDs with this medication due to risk of GI bleeding.  Recommended she avoid strenuous activity involving right hand.  She was given contact information for hand specialist and instructed to follow-up next week  radicular symptoms do not improve.  Strict return precautions given to which patient expressed understanding.  Final Clinical Impressions(s) / UC Diagnoses   Final diagnoses:  Pain of right thumb  Swelling of right thumb  Abnormal x-ray of hand     Discharge Instructions     There was no fracture noted.  They did see some for metal bodies in your hand but not where you are having any pain.  Please use Naprosyn for pain relief.  You should not use additional NSAIDs including aspirin, ibuprofen/Advil, naproxen/Aleve with this medication as it can cause stomach bleeding.  You can use Tylenol for additional pain relief.  Please follow-up with hand specialist as we discussed.  If anything worsens please return for reevaluation.    ED Prescriptions    Medication Sig Dispense Auth. Provider   naproxen (NAPROSYN) 500 MG tablet Take 1 tablet (500 mg total) by mouth 2 (two) times daily. 30 tablet Phenix Grein, Noberto Retort, PA-C     PDMP not reviewed this encounter.   Jeani Hawking, PA-C 04/13/21 1853

## 2022-01-10 ENCOUNTER — Ambulatory Visit (INDEPENDENT_AMBULATORY_CARE_PROVIDER_SITE_OTHER): Payer: Medicaid Other

## 2022-01-10 ENCOUNTER — Other Ambulatory Visit: Payer: Self-pay

## 2022-01-10 ENCOUNTER — Encounter (HOSPITAL_COMMUNITY): Payer: Self-pay | Admitting: Emergency Medicine

## 2022-01-10 ENCOUNTER — Ambulatory Visit (HOSPITAL_COMMUNITY)
Admission: EM | Admit: 2022-01-10 | Discharge: 2022-01-10 | Disposition: A | Discharge status: Home / Self Care | Payer: Medicaid Other | Attending: Family Medicine | Admitting: Family Medicine

## 2022-01-10 DIAGNOSIS — K5900 Constipation, unspecified: Secondary | ICD-10-CM

## 2022-01-10 DIAGNOSIS — R109 Unspecified abdominal pain: Secondary | ICD-10-CM

## 2022-01-10 MED ORDER — LACTULOSE 10 GM/15ML PO SOLN: 20.0000 g | Freq: Two times a day (BID) | ORAL | 0 refills | Status: DC | PRN

## 2022-01-10 NOTE — Discharge Instructions (Addendum)
You were seen today for constipation. Your xray did not show an obstruction, but did show moderate stool.  Given your pain I have recommended you go to the ER, which you have declined.  Today I have sent out an oral medication to see if this is helpful.  If this is not helpful, or if you have worsening pain, nausea, vomiting then you should go to the ER for evaluation.   Please follow up with your primary care provider for further care if needed.

## 2022-01-10 NOTE — ED Provider Notes (Signed)
[place]    CSN: [id number] Arrival date & time: 01/10/22  1327      History   Chief Complaint Chief Complaint  Patient presents with   Constipation    HPI Julia Roman is a 26 y.o. female.   Patient is here for constipation. Last normal BM x 5 days.  She did an enema 2 days ago and had bm after that.  It was soft, liquidy.  But since then no BM on her own.  Her last BM was very hard.  She is having a lot of abd pain, + nausea, no vomiting.  She is burping.  She is not eating much b/c decreased appetite.  She is urinating okay.   No fevers/no chills;   She just feels horrible with this issue.  She does not want to go to the ER.  She did try an otc pill to help, but that did nothing.   Past Medical History:  Diagnosis Date   Anemia    Chlamydia 2010   Depression AGE 46   NO MEDS CURRENTLY   Gonorrhea 2010   Headache    Ovarian cyst 2013   Sickle cell trait ([place])    Trichomonas vaginalis infection 4/282015   Urinary tract infection    OCC    Patient Active Problem List   Diagnosis Date Noted   Supervision of high-risk pregnancy 12/11/2020   Recurrent pregnancy loss 12/11/2020   Supervision of high risk pregnancy, antepartum 10/22/2020   [redacted] weeks gestation of pregnancy 10/22/2020   Sickle cell trait ([place])    Non-compliant pregnant patient 05/16/2019   Pruritic urticarial papules and plaques of pregnancy 05/16/2019   Trichomonal vaginitis during pregnancy 01/19/2019   History of preterm delivery 04/21/2014   Tobacco use disorder affecting pregnancy, antepartum 04/05/2014   Current smoker 02/03/2013   Marijuana use 02/03/2013   History of depression 02/03/2013   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 Authorizing Provider  naproxen (NAPROSYN) 500 MG tablet Take 1 tablet (500 mg total) by mouth 2 (two) times daily. 04/13/21   Raspet, Derry Skill, PA-C    Family History Family History  Problem Relation Age of Onset   Asthma Mother    Fibromyalgia Mother    Asthma Brother    Arthritis Maternal Grandfather    Diabetes Maternal Grandfather    Hypertension Maternal Grandfather    Kidney disease Maternal Grandfather    Stroke Maternal Grandfather    Birth defects Cousin        CLEFT LIP   Cancer Cousin    Cancer Maternal Aunt        MOUTH   Hypertension Maternal Aunt    Heart disease Maternal Grandmother    Hyperlipidemia Maternal Grandmother    Hypertension Maternal Grandmother    Diabetes Paternal Grandmother    Hypertension  Maternal Aunt     Social History Social History   Tobacco Use   Smoking status: Every Day    Packs/day: 0.25    Types: Cigarettes   Smokeless tobacco: Never  Vaping Use   Vaping Use: Never used  Substance Use Topics   Alcohol use: Not Currently    Comment: 1 times a week   Drug use: Yes    Frequency: 7.0 times per week    Types: Marijuana    Comment: daily     Allergies   Tramadol   Review of Systems Review of Systems  Constitutional:  Positive for activity change, appetite change, fatigue and fever. Negative for chills.  HENT: Negative.    Respiratory: Negative.    Cardiovascular: Negative.   Gastrointestinal:  Positive for abdominal pain, constipation and nausea. Negative for vomiting.  Genitourinary: Negative.     Physical Exam Triage Vital Signs ED Triage Vitals  Enc Vitals Group     BP 01/10/22 1418 127/85     Pulse Rate 01/10/22 1418 64     Resp 01/10/22 1418 16     Temp 01/10/22 1418 98.9 F (37.2 C)     Temp Source 01/10/22 1418 Oral     SpO2 01/10/22 1418 100 %     Weight --      Height --      Head  Circumference --      Peak Flow --      Pain Score 01/10/22 1417 7     Pain Loc --      Pain Edu? --      Excl. in [place]? --    No data found.  Updated Vital Signs BP 127/85 (BP Location: Left Arm)    Pulse 64    Temp 98.9 F (37.2 C) (Oral)    Resp 16    SpO2 100%   Visual Acuity Right Eye Distance:   Left Eye Distance:   Bilateral Distance:    Right Eye Near:   Left Eye Near:    Bilateral Near:     Physical Exam Constitutional:      Appearance: Normal appearance.  Cardiovascular:     Rate and Rhythm: Normal rate and regular rhythm.  Pulmonary:     Effort: Pulmonary effort is normal.     Breath sounds: Normal breath sounds.  Abdominal:     General: Bowel sounds are decreased.     Palpations: Abdomen is soft. There is no mass.     Tenderness: There is abdominal tenderness in the epigastric area. There is no guarding or rebound.  Musculoskeletal:     Cervical back: Normal range of motion.  Neurological:     Mental Status: She is alert.     UC Treatments / Results  Labs (all labs ordered are listed, but only abnormal results are displayed) Labs Reviewed - No data to display  EKG   Radiology DG Abd 1 View  Result Date: 01/10/2022 CLINICAL DATA:  Constipation EXAM: ABDOMEN - 1 VIEW COMPARISON:  None. FINDINGS: Nonobstructive bowel gas pattern. Moderate colonic stool burden. No radiopaque calculi overlie the kidneys or course of the ureters. Pelvic phleboliths. No acute osseous abnormality. IMPRESSION: No evidence of bowel obstruction.  Moderate colonic stool burden. Electronically Signed   By: Maurine Simmering M.D.   On: 01/10/2022 15:08    Procedures Procedures (including critical care time)  Medications Ordered in UC Medications - No data to display  Initial Impression / Assessment and Plan / UC Course  I have reviewed the triage vital signs and the nursing notes.  Pertinent labs & imaging results that were available during my care of the patient were reviewed by  me and considered in my medical decision making (see chart for details).   Patient was seen today for abdominal pain and constipation.  She appears quite uncomfortable today.  Her xray does not show obstruction.  She does not want to go to the ER today if she can avoid it.  As a result, will start with oral medication for her constipation.  Discussed that if this was not helpful, or if she develops worsening pain, nausea or vomiting she needs to go to the ER for further treatment.    Final Clinical Impressions(s) / UC Diagnoses   Final diagnoses:  Constipation, unspecified constipation type  Abdominal pain, unspecified abdominal location     Discharge Instructions      You were seen today for constipation. Your xray did not show an obstruction, but did show moderate stool.  Given your pain I have recommended you go to the ER, which you have declined.  Today I have sent out an oral medication to see if this is helpful.  If this is not helpful, or if you have worsening pain, nausea, vomiting then you should go to the ER for evaluation.   Please follow up with your primary care provider for further care if needed.      ED Prescriptions     Medication Sig Dispense Auth. Provider   lactulose (CHRONULAC) 10 GM/15ML solution Take 30 mLs (20 g total) by mouth 2 (two) times daily as needed for moderate constipation. 236 mL Rondel Oh, MD      PDMP not reviewed this encounter.   Rondel Oh, MD 01/10/22 [phone]

## 2022-01-10 NOTE — ED Triage Notes (Signed)
Pt reports constipation for 5 days. Tried an enema 2 days ago.

## 2022-05-17 ENCOUNTER — Emergency Department (HOSPITAL_BASED_OUTPATIENT_CLINIC_OR_DEPARTMENT_OTHER)
Admission: EM | Admit: 2022-05-17 | Discharge: 2022-05-17 | Disposition: A | Discharge status: Home / Self Care | Payer: Medicaid Other | Attending: Emergency Medicine | Admitting: Emergency Medicine

## 2022-05-17 ENCOUNTER — Emergency Department (HOSPITAL_BASED_OUTPATIENT_CLINIC_OR_DEPARTMENT_OTHER): Payer: Medicaid Other

## 2022-05-17 ENCOUNTER — Encounter (HOSPITAL_BASED_OUTPATIENT_CLINIC_OR_DEPARTMENT_OTHER): Payer: Self-pay

## 2022-05-17 ENCOUNTER — Other Ambulatory Visit: Payer: Self-pay

## 2022-05-17 DIAGNOSIS — R1031 Right lower quadrant pain: Secondary | ICD-10-CM

## 2022-05-17 DIAGNOSIS — N83201 Unspecified ovarian cyst, right side: Secondary | ICD-10-CM | POA: Insufficient documentation

## 2022-05-17 DIAGNOSIS — R109 Unspecified abdominal pain: Secondary | ICD-10-CM | POA: Diagnosis present

## 2022-05-17 LAB — URINALYSIS, ROUTINE W REFLEX MICROSCOPIC
Bilirubin Urine: NEGATIVE
Glucose, UA: NEGATIVE mg/dL
Hgb urine dipstick: NEGATIVE
Ketones, ur: 15 mg/dL — AB
Nitrite: NEGATIVE
Specific Gravity, Urine: 1.023 (ref 1.005–1.030)
pH: 7.5 (ref 5.0–8.0)

## 2022-05-17 LAB — CBC
HCT: 33.9 % — ABNORMAL LOW (ref 36.0–46.0)
Hemoglobin: 11.5 g/dL — ABNORMAL LOW (ref 12.0–15.0)
MCH: 23.9 pg — ABNORMAL LOW (ref 26.0–34.0)
MCHC: 33.9 g/dL (ref 30.0–36.0)
MCV: 70.3 fL — ABNORMAL LOW (ref 80.0–100.0)
Platelets: 433 10*3/uL — ABNORMAL HIGH (ref 150–400)
RBC: 4.82 MIL/uL (ref 3.87–5.11)
RDW: 15.2 % (ref 11.5–15.5)
WBC: 7.3 10*3/uL (ref 4.0–10.5)
nRBC: 0 % (ref 0.0–0.2)

## 2022-05-17 LAB — COMPREHENSIVE METABOLIC PANEL
ALT: 7 U/L (ref 0–44)
AST: 13 U/L — ABNORMAL LOW (ref 15–41)
Albumin: 4.2 g/dL (ref 3.5–5.0)
Alkaline Phosphatase: 56 U/L (ref 38–126)
Anion gap: 10 (ref 5–15)
BUN: 9 mg/dL (ref 6–20)
CO2: 24 mmol/L (ref 22–32)
Calcium: 9.8 mg/dL (ref 8.9–10.3)
Chloride: 104 mmol/L (ref 98–111)
Creatinine, Ser: 0.66 mg/dL (ref 0.44–1.00)
GFR, Estimated: >60 mL/min (ref >60)
Glucose, Bld: 92 mg/dL (ref 70–99)
Potassium: 3.5 mmol/L (ref 3.5–5.1)
Sodium: 138 mmol/L (ref 135–145)
Total Bilirubin: 0.7 mg/dL (ref 0.3–1.2)
Total Protein: 7.6 g/dL (ref 6.5–8.1)

## 2022-05-17 LAB — WET PREP, GENITAL
Clue Cells Wet Prep HPF POC: NONE SEEN
Sperm: NONE SEEN
Trich, Wet Prep: NONE SEEN
WBC, Wet Prep HPF POC: <10 (ref <10)
Yeast Wet Prep HPF POC: NONE SEEN

## 2022-05-17 LAB — LIPASE, BLOOD: Lipase: 24 U/L (ref 11–51)

## 2022-05-17 LAB — PREGNANCY, URINE: Preg Test, Ur: NEGATIVE

## 2022-05-17 MED ORDER — LACTATED RINGERS IV BOLUS
1000.0000 mL | Freq: Once | INTRAVENOUS | Status: AC
  Administered 2022-05-17: 1000 mL via INTRAVENOUS

## 2022-05-17 MED ORDER — FENTANYL CITRATE PF 50 MCG/ML IJ SOSY
50.0000 ug | PREFILLED_SYRINGE | Freq: Once | INTRAMUSCULAR | Status: AC
  Administered 2022-05-17: 50 ug via INTRAVENOUS
  Filled 2022-05-17: qty 1

## 2022-05-17 MED ORDER — IOHEXOL 300 MG/ML  SOLN
100.0000 mL | Freq: Once | INTRAMUSCULAR | Status: AC | PRN
  Administered 2022-05-17: 80 mL via INTRAVENOUS

## 2022-05-17 MED ORDER — ONDANSETRON HCL 4 MG/2ML IJ SOLN
4.0000 mg | Freq: Once | INTRAMUSCULAR | Status: AC
  Administered 2022-05-17: 4 mg via INTRAVENOUS
  Filled 2022-05-17: qty 2

## 2022-05-17 NOTE — ED Triage Notes (Signed)
Pt c/o fatigue, nausea, and abdominal pain x 1 week. Pt reports she took a pregnancy test at home which was negative. LMP was around the end of April.

## 2022-05-17 NOTE — ED Provider Notes (Signed)
[facility] EMERGENCY DEPT Provider Note   CSN: [id number] Arrival date & time: 05/17/22  1324     History  Chief Complaint  Patient presents with   Abdominal Pain   Nausea   Fatigue    MODEST DRAEGER is a 26 y.o. female.   Abdominal Pain Associated symptoms: fatigue and nausea     26 year old female presenting to the emergency department with a chief complaint of fatigue, nausea and abdominal discomfort for the past week.  She took a home pregnancy test which was negative.  She states that she is late for her menstrual cycle which is normally regular.  Her LMP was around the end of April close to April 30.  She denies any vaginal bleeding or vaginal discharge.  She is sexually active and consents to STI testing.  She endorses suprapubic discomfort and right lower quadrant discomfort.  She denies any dysuria or increased urinary frequency.  She is tolerating oral intake.  The patient has a history of gonorrhea, chlamydia and trichomonas and states that her symptoms feel different compared to those infections. The patient states that she has a history of ovarian cyst on the right.  Home Medications Prior to Admission medications   Medication Sig Start Date End Date Taking? Authorizing Provider  lactulose (CHRONULAC) 10 GM/15ML solution Take 30 mLs (20 g total) by mouth 2 (two) times daily as needed for moderate constipation. 01/10/22   Jannifer Franklin, MD      Allergies    Tramadol    Review of Systems   Review of Systems  Constitutional:  Positive for fatigue.  Gastrointestinal:  Positive for abdominal pain and nausea.  All other systems reviewed and are negative.   Physical Exam Updated Vital Signs BP 107/70 (BP Location: Right Arm)   Pulse (!) 58   Temp 98.2 F (36.8 C) (Oral)   Resp 16   Ht 5\' 1"  (1.549 m)   Wt 49.9 kg   SpO2 100%   BMI 20.78 kg/m  Physical Exam Vitals and nursing note reviewed. Exam conducted with a chaperone present.   Constitutional:      General: She is not in acute distress.    Appearance: She is well-developed.  HENT:     Head: Normocephalic and atraumatic.  Eyes:     Conjunctiva/sclera: Conjunctivae normal.  Cardiovascular:     Rate and Rhythm: Normal rate and regular rhythm.     Heart sounds: No murmur heard. Pulmonary:     Effort: Pulmonary effort is normal. No respiratory distress.     Breath sounds: Normal breath sounds.  Abdominal:     Palpations: Abdomen is soft.     Tenderness: There is abdominal tenderness in the right lower quadrant and suprapubic area. There is no right CVA tenderness, left CVA tenderness, guarding or rebound.  Genitourinary:    Cervix: Discharge present. No cervical motion tenderness, erythema or cervical bleeding.     Adnexa: Right adnexa normal and left adnexa normal.     Comments: No adnexal tenderness on left, slight adnexal TTP on the right, no cervical motion tenderness, minimal discharge noted from the cervical os without bleeding present Musculoskeletal:        General: No swelling.     Cervical back: Neck supple.  Skin:    General: Skin is warm and dry.     Capillary Refill: Capillary refill takes less than 2 seconds.  Neurological:     Mental Status: She is alert.  Psychiatric:  Mood and Affect: Mood normal.     ED Results / Procedures / Treatments   Labs (all labs ordered are listed, but only abnormal results are displayed) Labs Reviewed  COMPREHENSIVE METABOLIC PANEL - Abnormal; Notable for the following components:      Result Value   AST 13 (*)    All other components within normal limits  CBC - Abnormal; Notable for the following components:   Hemoglobin 11.5 (*)    HCT 33.9 (*)    MCV 70.3 (*)    MCH 23.9 (*)    Platelets 433 (*)    All other components within normal limits  URINALYSIS, ROUTINE W REFLEX MICROSCOPIC - Abnormal; Notable for the following components:   APPearance HAZY (*)    Ketones, ur 15 (*)    Protein, ur  TRACE (*)    Leukocytes,Ua SMALL (*)    All other components within normal limits  WET PREP, GENITAL  LIPASE, BLOOD  PREGNANCY, URINE  GC/CHLAMYDIA PROBE AMP ([place]) NOT AT [facility]    EKG None  Radiology CT ABDOMEN PELVIS W CONTRAST  Result Date: 05/17/2022 CLINICAL DATA:  Fatigue with nausea and abdominal pain x1 week. EXAM: CT ABDOMEN AND PELVIS WITH CONTRAST TECHNIQUE: Multidetector CT imaging of the abdomen and pelvis was performed using the standard protocol following bolus administration of intravenous contrast. RADIATION DOSE REDUCTION: This exam was performed according to the departmental dose-optimization program which includes automated exposure control, adjustment of the mA and/or kV according to patient size and/or use of iterative reconstruction technique. CONTRAST:  109mL OMNIPAQUE IOHEXOL 300 MG/ML  SOLN COMPARISON:  September 26, 2010 FINDINGS: Lower chest: No acute abnormality. Hepatobiliary: No focal liver abnormality is seen. No gallstones, gallbladder wall thickening, or biliary dilatation. Pancreas: Unremarkable. No pancreatic ductal dilatation or surrounding inflammatory changes. Spleen: Normal in size without focal abnormality. Adrenals/Urinary Tract: Adrenal glands are unremarkable. Kidneys are normal, without renal calculi, focal lesion, or hydronephrosis. Bladder is unremarkable. Stomach/Bowel: Stomach is within normal limits. Appendix appears normal. No evidence of bowel wall thickening, distention, or inflammatory changes. Vascular/Lymphatic: No significant vascular findings are present. No enlarged abdominal or pelvic lymph nodes. Reproductive: Uterus is unremarkable. A 3.0 cm x 2.8 cm simple cyst is seen along the expected region of the right adnexa. Other: No abdominal wall hernia or abnormality. No abdominopelvic ascites. Musculoskeletal: No acute or significant osseous findings. IMPRESSION: 3.0 cm x 2.8 cm simple cyst along the expected region of the right adnexa,  likely ovarian in origin. No follow-up imaging is recommended. Reference: JACR 2020 Feb;17(2):248-254 Electronically Signed   By: Aram Candela M.D.   On: 05/17/2022 15:14    Procedures Procedures    Medications Ordered in ED Medications  fentaNYL (SUBLIMAZE) injection 50 mcg (50 mcg Intravenous Given 05/17/22 1505)  lactated ringers bolus 1,000 mL (1,000 mLs Intravenous New Bag/Given 05/17/22 1504)  ondansetron (ZOFRAN) injection 4 mg (4 mg Intravenous Given 05/17/22 1508)  iohexol (OMNIPAQUE) 300 MG/ML solution 100 mL (80 mLs Intravenous Contrast Given 05/17/22 1455)    ED Course/ Medical Decision Making/ A&P                           Medical Decision Making Amount and/or Complexity of Data Reviewed Labs: ordered. Radiology: ordered.  Risk Prescription drug management.   26 year old female presenting to the emergency department with a chief complaint of fatigue, nausea and abdominal discomfort for the past week.  She took a home pregnancy  test which was negative.  She states that she is late for her menstrual cycle which is normally regular.  Her LMP was around the end of April close to April 30.  She denies any vaginal bleeding or vaginal discharge.  She is sexually active and consents to STI testing.  She endorses suprapubic discomfort and right lower quadrant discomfort.  She denies any dysuria or increased urinary frequency.  She is tolerating oral intake.  The patient has a history of gonorrhea, chlamydia and trichomonas and states that her symptoms feel different compared to those infections. The patient states that she has a history of ovarian cyst on the right. Her pain has been intermittent and sharp.  Vitals and telemetry on arrival: Afebrile, hemodynamically stable, saturating well on room air.  Sinus rhythm noted on cardiac telemetry  Pertinent exam findings include: Right lower quadrant and suprapubic tenderness to palpation, bimanual exam without significant cervical  motion tenderness, no significant adnexal tenderness  Differential Diagnosis:  Most likely:  Appendicitis, nephrolithiasis, ovarian cyst, ovarian torsion.  Less likely:  bowel Obstruction, Pyelonephritis, Pancreatitis, Cholecystitis, Shingles, Perforated Bowel or Ulcer, Diverticulosis/itis, Ischemic Mesentery, Inflammatory Bowel Disease, Strangulated/Incarcerated Hernia, gastritis, PUD.   Lab results include: CMP without electrolyte abnormality, normal renal and liver function, lipase normal, urine pregnancy negative, wet prep negative for yeast, trichomonas, clue cells, CBC without leukocytosis, stable anemia 11.5, urinalysis without evidence of urinary tract infection, GC/committee a testing collected and pending  Imaging results include: CT Abdomen Pelvis: IMPRESSION:  3.0 cm x 2.8 cm simple cyst along the expected region of the right  adnexa, likely ovarian in origin. No follow-up imaging is  recommended. Reference: JACR 2020 Feb;17(2):248-254    Course of tx has consisted of: The patient was administered an IV fluid bolus, IV fentanyl, IV Zofran  Thought process: Given the finding of ovarian cyst in the right lower quadrant, no evidence of appendicitis, right lower quadrant tenderness to palpation that is been intermittent in nature with sharp pain in the right lower quadrant over the past week, consideration given to intermittent ovarian torsion as the etiology of the patient's presentation.  Pelvic ultrasound with Doppler was ordered for further evaluation.   Signout given to Dr. Charm BargesButler at [phone] with a plan to follow-up on the ultrasound results, likely discharge if reassuring.     Final Clinical Impression(s) / ED Diagnoses Final diagnoses:  Cyst of right ovary  RLQ abdominal pain    Rx / DC Orders ED Discharge Orders     None         Ernie AvenaLawsing, Samina Weekes, MD 05/17/22 1524

## 2022-05-17 NOTE — ED Provider Notes (Signed)
Signout from Dr. Florentina Addison.  26 year old female with fatigue nausea abdominal pain.  Her work-up has been fairly unremarkable.  She is pending a pelvic ultrasound to make sure there is no torsion. Physical Exam  BP 109/72 (BP Location: Right Arm)   Pulse 60   Temp 98.2 F (36.8 C) (Oral)   Resp 16   Ht 5\' 1"  (1.549 m)   Wt 49.9 kg   SpO2 100%   BMI 20.78 kg/m   Physical Exam  Procedures  Procedures  ED Course / MDM    Medical Decision Making Amount and/or Complexity of Data Reviewed Labs: ordered. Radiology: ordered.  Risk Prescription drug management.   Pelvic ultrasound negative for torsion.  Reviewed with patient.  Recommended close follow-up with her GYN and PCP.  Return instructions discussed       , MD 05/17/22 [phone]

## 2022-05-17 NOTE — Discharge Instructions (Addendum)
Recommend Ibuprofen and Tylenol for pain control. Your CT revealed evidence of a right sided ovarian cyst.  Your ultrasound was negative for ovarian torsion.  Your CT was negative for appendicitis or other acute abnormality.  Your laboratory evaluation was reassuring.

## 2022-05-19 LAB — GC/CHLAMYDIA PROBE AMP (~~LOC~~) NOT AT ARMC
Chlamydia: NEGATIVE
Comment: NEGATIVE
Comment: NORMAL
Neisseria Gonorrhea: NEGATIVE

## 2022-09-28 ENCOUNTER — Emergency Department (HOSPITAL_BASED_OUTPATIENT_CLINIC_OR_DEPARTMENT_OTHER)
Admission: EM | Admit: 2022-09-28 | Discharge: 2022-09-28 | Discharge status: Against Medical Advice | Payer: Medicaid Other | Attending: Emergency Medicine | Admitting: Emergency Medicine

## 2022-09-28 ENCOUNTER — Encounter (HOSPITAL_BASED_OUTPATIENT_CLINIC_OR_DEPARTMENT_OTHER): Payer: Self-pay | Admitting: Emergency Medicine

## 2022-09-28 ENCOUNTER — Other Ambulatory Visit: Payer: Self-pay

## 2022-09-28 DIAGNOSIS — Z3A01 Less than 8 weeks gestation of pregnancy: Secondary | ICD-10-CM | POA: Insufficient documentation

## 2022-09-28 DIAGNOSIS — N644 Mastodynia: Secondary | ICD-10-CM | POA: Insufficient documentation

## 2022-09-28 DIAGNOSIS — Z5321 Procedure and treatment not carried out due to patient leaving prior to being seen by health care provider: Secondary | ICD-10-CM | POA: Diagnosis not present

## 2022-09-28 DIAGNOSIS — S20111A Abrasion of breast, right breast, initial encounter: Secondary | ICD-10-CM | POA: Insufficient documentation

## 2022-09-28 DIAGNOSIS — Z3201 Encounter for pregnancy test, result positive: Secondary | ICD-10-CM | POA: Insufficient documentation

## 2022-09-28 DIAGNOSIS — S21051A Open bite of right breast, initial encounter: Secondary | ICD-10-CM | POA: Diagnosis present

## 2022-09-28 DIAGNOSIS — S21011A Laceration without foreign body of right breast, initial encounter: Secondary | ICD-10-CM | POA: Insufficient documentation

## 2022-09-28 DIAGNOSIS — O9A211 Injury, poisoning and certain other consequences of external causes complicating pregnancy, first trimester: Secondary | ICD-10-CM | POA: Diagnosis not present

## 2022-09-28 LAB — PREGNANCY, URINE: Preg Test, Ur: POSITIVE — AB
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
## 2022-09-29 ENCOUNTER — Emergency Department (HOSPITAL_BASED_OUTPATIENT_CLINIC_OR_DEPARTMENT_OTHER)
Admission: EM | Admit: 2022-09-29 | Discharge: 2022-09-29 | Disposition: A | Discharge status: Home / Self Care | Payer: Medicaid Other | Source: Home / Self Care | Attending: Emergency Medicine | Admitting: Emergency Medicine

## 2022-09-29 ENCOUNTER — Telehealth: Payer: Self-pay | Admitting: Family Medicine

## 2022-09-29 DIAGNOSIS — Z3A01 Less than 8 weeks gestation of pregnancy: Secondary | ICD-10-CM

## 2022-09-29 DIAGNOSIS — Z3201 Encounter for pregnancy test, result positive: Secondary | ICD-10-CM

## 2022-09-29 DIAGNOSIS — S20111A Abrasion of breast, right breast, initial encounter: Secondary | ICD-10-CM

## 2022-09-29 MED ORDER — PRENATAL COMPLETE 14-0.4 MG PO TABS
1.0000 | ORAL_TABLET | Freq: Every day | ORAL | 0 refills | Status: DC
Start: 2022-09-29 — End: 2022-10-01

## 2022-09-29 NOTE — Telephone Encounter (Signed)
Patient called in stating with her last pregnancy she had to get some injections right at the beginning in order to save her pregnancy and her last delivery the doctor urged her to let us know as soon as she gets pregnant to get started on that shot. She also wanted to know if she could get the results from her self swab through the hospital.

## 2022-09-29 NOTE — ED Notes (Signed)
Pt requesting self swab for the G/C/Chlamydia test. Instructions were given. Pt understood and used teach back method on how to collect sample. Sample was left at bedside. Sample was picked up and transported to the lab.

## 2022-09-29 NOTE — Discharge Instructions (Signed)
You were seen today.  Test confirmed positive pregnancy test.  You are approximately 6 weeks and 3 days pregnant by last menstrual period.  You need to follow-up with [facility] for ultrasound imaging to confirm dating.  You were tested for gonorrhea and chlamydia.  If this is positive, you will receive a phone call.  Start taking a prenatal vitamin.  Regarding the abrasion on your right breast, this will likely heal without incident.  You may apply antibiotic ointment.
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
## 2022-09-30 ENCOUNTER — Encounter (INDEPENDENT_AMBULATORY_CARE_PROVIDER_SITE_OTHER): Payer: Self-pay

## 2022-09-30 LAB — GC/CHLAMYDIA PROBE AMP (~~LOC~~) NOT AT ARMC
Chlamydia: NEGATIVE
Comment: NEGATIVE
Comment: NORMAL
Neisseria Gonorrhea: NEGATIVE

## 2022-10-01 MED ORDER — PRENATAL 19 29-1 MG PO CHEW: 1.0000 | CHEWABLE_TABLET | Freq: Every day | ORAL | 11 refills | Status: DC

## 2022-10-01 NOTE — Telephone Encounter (Signed)
Called patient and discussed injections are no longer recommended. Patient verbalized understanding and asked about getting the stitch put in. Patient states she was told she would need her cervix stapled. Discussed with patient she will be 11.5 weeks at her new OB appt and she can discuss it with the provider at that time. Advised patient that procedure is usually done closer to 16 weeks. Patient verbalized understanding and started to ask a question but then the phone was disconnected. Called patient back several times but message stated "your call cannot be completed". Will send mychart message.

## 2022-10-01 NOTE — Addendum Note (Signed)
Addended by: Shelly Coss on: 10/01/2022 02:31 PM   Modules accepted: Orders

## 2022-10-21 ENCOUNTER — Ambulatory Visit (INDEPENDENT_AMBULATORY_CARE_PROVIDER_SITE_OTHER): Payer: Medicaid Other

## 2022-10-21 VITALS — BP 125/72 | HR 72 | Wt 110.6 lb

## 2022-10-21 DIAGNOSIS — Z3491 Encounter for supervision of normal pregnancy, unspecified, first trimester: Secondary | ICD-10-CM

## 2022-10-21 DIAGNOSIS — O0990 Supervision of high risk pregnancy, unspecified, unspecified trimester: Secondary | ICD-10-CM | POA: Insufficient documentation

## 2022-10-21 MED ORDER — GOJJI WEIGHT SCALE MISC
1.0000 | Freq: Once | 0 refills | Status: AC
Start: 2022-10-21 — End: 2022-10-21

## 2022-10-21 MED ORDER — PRENATAL PLUS VITAMIN/MINERAL 27-1 MG PO TABS
1.0000 | ORAL_TABLET | Freq: Every day | ORAL | 11 refills | Status: AC
Start: 2022-10-21 — End: ?

## 2022-10-21 MED ORDER — BLOOD PRESSURE KIT DEVI: 1.0000 | Freq: Once | 0 refills | Status: AC

## 2022-10-21 NOTE — Patient Instructions (Signed)
Behavioral Health Services At our [facility], we work as an integrated team, providing care to address both physical and emotional health. Your medical provider may refer you to see our Behavioral Health Clinician (BHC) on the same day you see your medical provider, as availability permits; often scheduled virtually at your convenience.  Our BHC is available to all patients, visits generally last between 20-30 minutes, but can be longer or shorter, depending on patient need. The BHC offers help with stress management, coping with symptoms of depression and anxiety, major life changes , sleep issues, changing risky behavior, grief and loss, life stress, working on personal life goals, and  behavioral health issues, as these all affect your overall health and wellness.  The BHC is NOT available for the following: FMLA paperwork, court-ordered evaluations, specialty assessments (custody or disability), letters to employers, or obtaining certification for an emotional support animal. The BHC does not provide long-term therapy. You have the right to refuse integrated behavioral health services, or to reschedule to see the BHC at a later date.  Confidentiality exception: If it is suspected that a child or disabled adult is being abused or neglected, we are required by law to report that to either Child Protective Services or Adult Protective Services.  If you have a diagnosis of Bipolar affective disorder, Schizophrenia, or recurrent Major depressive disorder, we will recommend that you establish care with a psychiatrist, as these are lifelong, chronic conditions, and we want your overall emotional health and medications to be more closely monitored. If you anticipate needing extended maternity leave due to mental health issues postpartum, it it recommended you inform your medical provider, so we can put in a referral to a psychiatrist as soon as possible. The BHC is unable to recommend an extended  maternity leave for mental health issues. Your medical provider or BHC may refer you to a therapist for ongoing, traditional therapy, or to a psychiatrist, for medication management, if it would benefit your overall health. Depending on your insurance, you may have a copay or be charged a deductible, depending on your insurance, to see the BHC. If you are uninsured, it is recommended that you apply for financial assistance. (Forms may be requested at the front desk for in-person visits, via MyChart, or request a form during a virtual visit).  If you see the BHC more than 6 times, you will have to complete a comprehensive clinical assessment interview with the BHC to resume integrated services.  For virtual visits with the BHC, you must be physically in the state of [place] at the time of the visit. For example, if you live in [place], you will have to do an in-person visit with the BHC, and your out-of-state insurance may not cover behavioral health services in [place]. If you are going out of the state or country for any reason, the BHC may see you virtually when you return to [place], but not while you are physically outside of [place].    

## 2022-10-21 NOTE — Progress Notes (Signed)
New OB Intake  Julia Roman is here today for new OB intake. We discussed EDD of 05/22/23 that is based on approx LMP of 08/15/22; will schedule dating [place]. Pt is G8/P2133. I reviewed her allergies, medications, Medical/Surgical/OB history, and appropriate screenings. [facility] information placed in AVS. Based on history, this is a high risk pregnancy.  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   Delivery Plans Plans to deliver at [facility]. Patient given information for [facility] Healthy [facility] website for more information about [facility]. Patient is unsure about water birth. Offered upcoming OB visit with CNM to discuss further.  MyChart/Babyscripts MyChart access verified. I explained pt will have some visits in office and some virtually. Babyscripts instructions given and order placed.  Blood Pressure Cuff/Weight Scale Blood pressure cuff ordered for patient to pick-up from [employer]. Explained after first prenatal appt pt will check weekly and document in Babyscripts. Patient does not have weight scale; order sent to [facility], patient may track weight weekly in Babyscripts.  Anatomy US Will be scheduled following dating [place].  Labs Discussed Avelina Laine genetic screening with patient. Would like both Panorama and Horizon drawn at new OB visit. Routine prenatal labs needed. A1C and PAP needed.   Is patient a CenteringPregnancy candidate?  Pt considering   Is patient a Mom+Baby Combined Care candidate?  Not a candidate     Social Determinants of Health Food Insecurity: Patient expresses food insecurity. Food Market information given to patient; explained patient may visit at the end of first OB appointment. WIC Referral: Patient is interested in referral to [facility].  Transportation: Patient expressed transportation needs. Childcare: Discussed no children allowed at ultrasound appointments. Offered childcare services; patient declines childcare services at this time.  First visit review I reviewed new OB appt with patient. I explained they will have a provider visit that includes physical exam with Pap smear and labs. Explained pt will be seen by Crissie Reese, MD at first visit; encounter routed to appropriate provider. Explained that patient will be seen by pregnancy navigator following visit with provider.   Marjo Bicker, RN 10/21/2022  5:23 PM

## 2022-10-31 IMAGING — CT CT ABD-PELV W/ CM
2 of 4 series · 15 of 46 positions shown, 17 images · IV contrast (APPLIED)
Comparison: September 26, 2010

CLINICAL DATA: Fatigue with nausea and abdominal pain x1 week.

EXAM:
CT ABDOMEN AND PELVIS WITH CONTRAST
TECHNIQUE: Multidetector CT imaging of the abdomen and pelvis was performed
using the standard protocol following bolus administration of
intravenous contrast.

[Series 2: abd pel w · axial · 0.63mm/px · z∈[+679,+1039]mm · 12 of 83 slices shown, 14 images]
[im 7/83  soft-tissue]
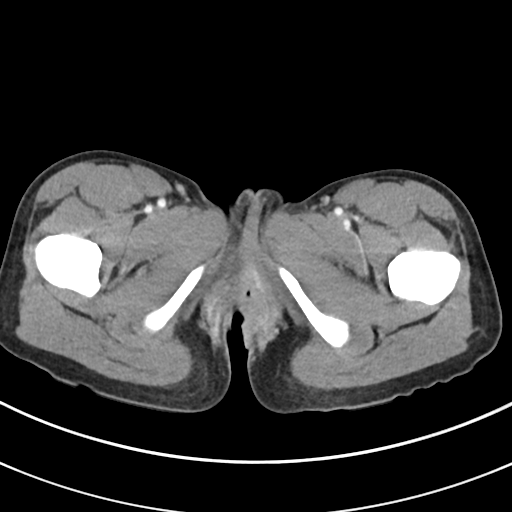
[im 7/83  bone]
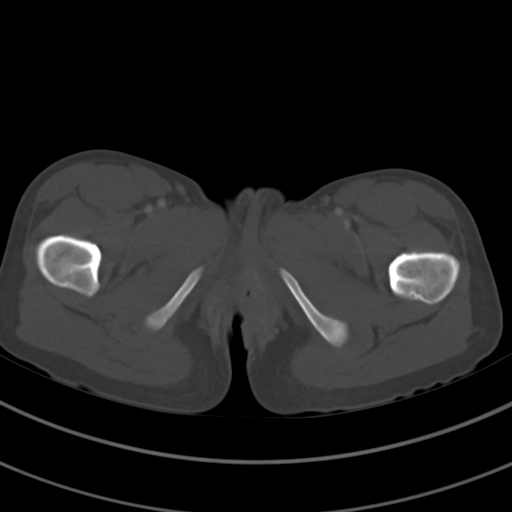
[im 14/83  soft-tissue]
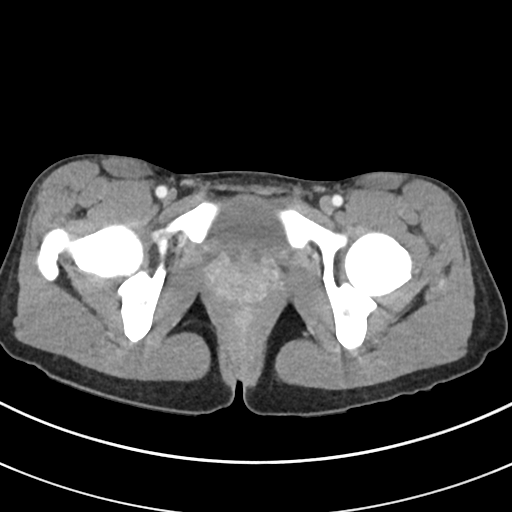
[im 20/83  soft-tissue]
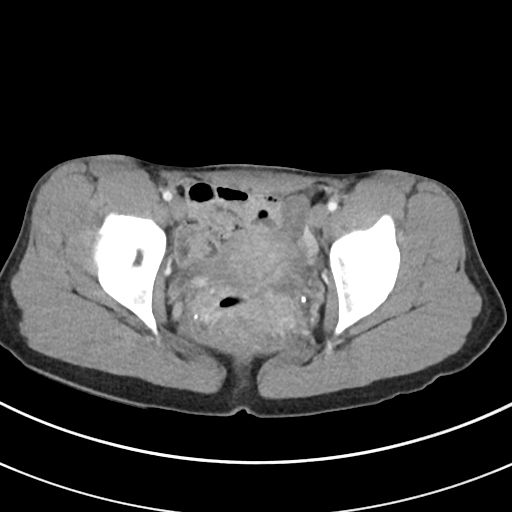
[im 27/83  soft-tissue]
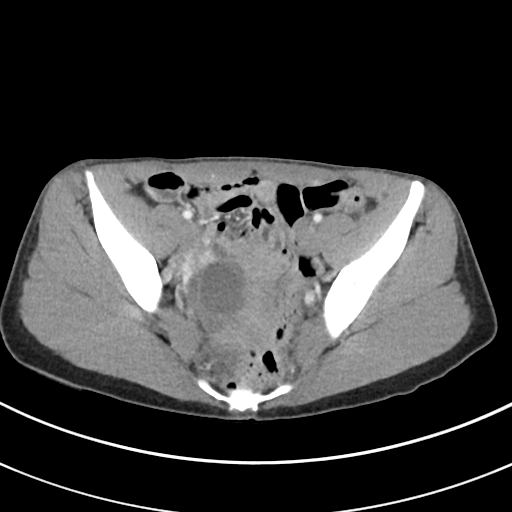
[im 33/83  soft-tissue]
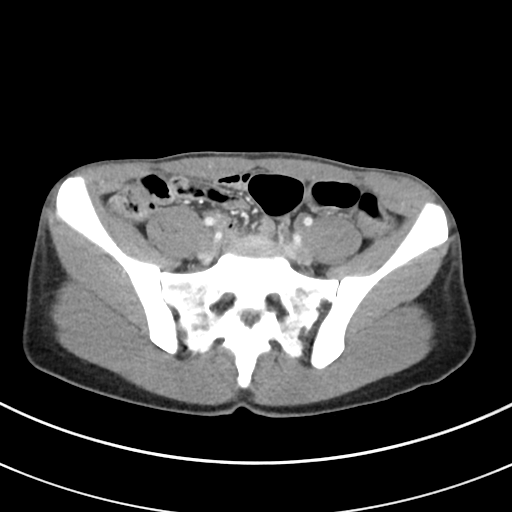
[im 40/83  soft-tissue]
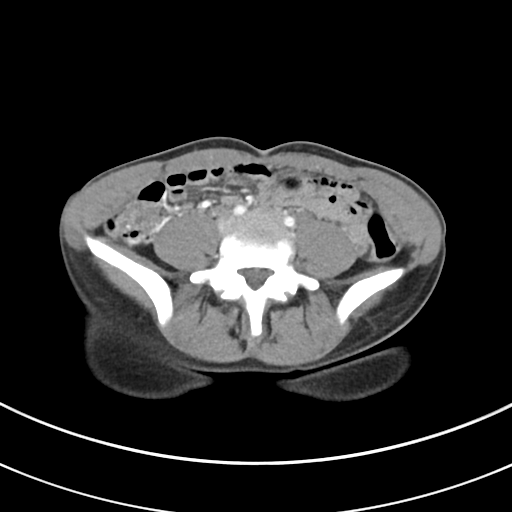
[im 46/83  soft-tissue]
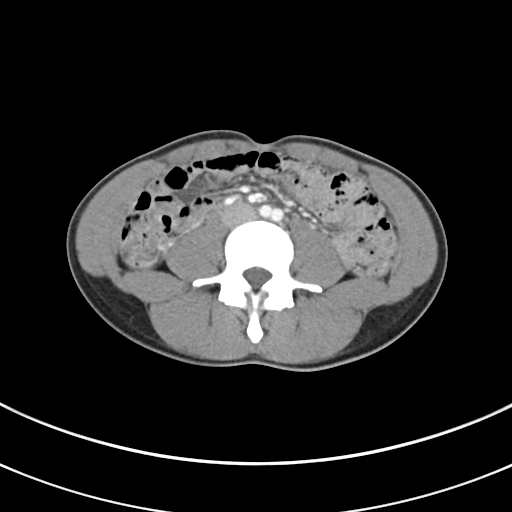
[im 53/83  soft-tissue]
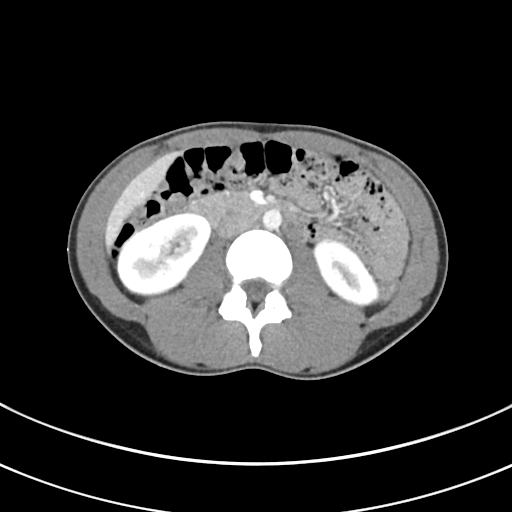
[im 60/83  soft-tissue]
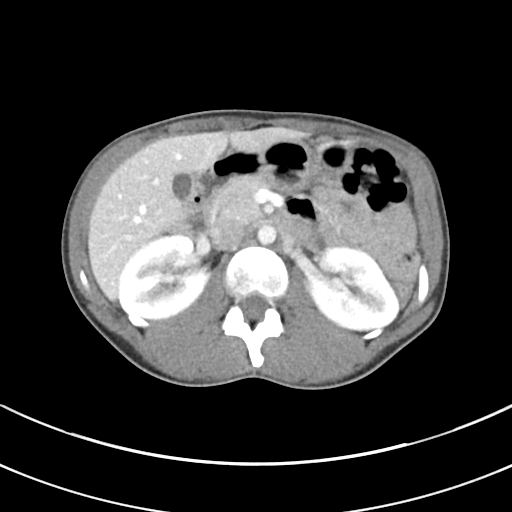
[im 60/83  bone]
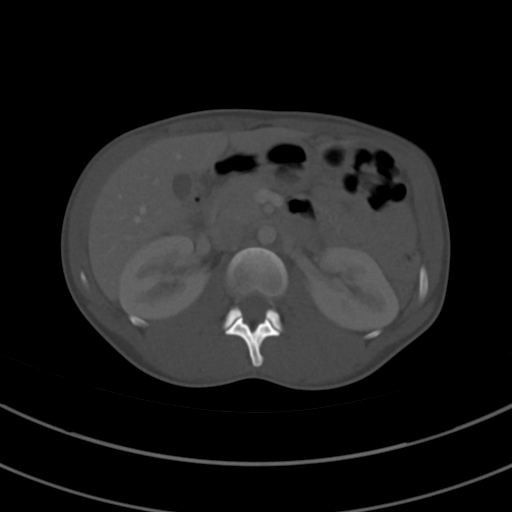
[im 66/83  soft-tissue]
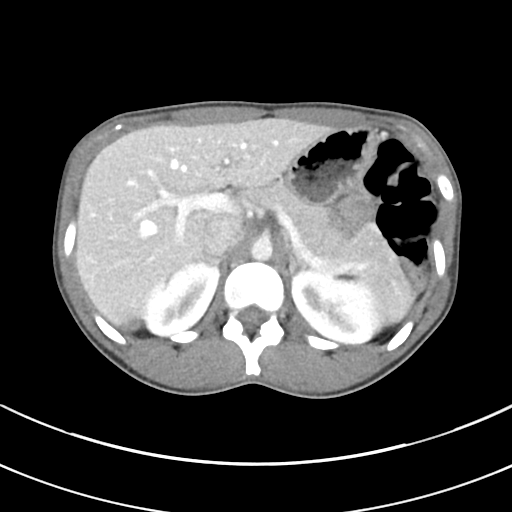
[im 73/83  soft-tissue]
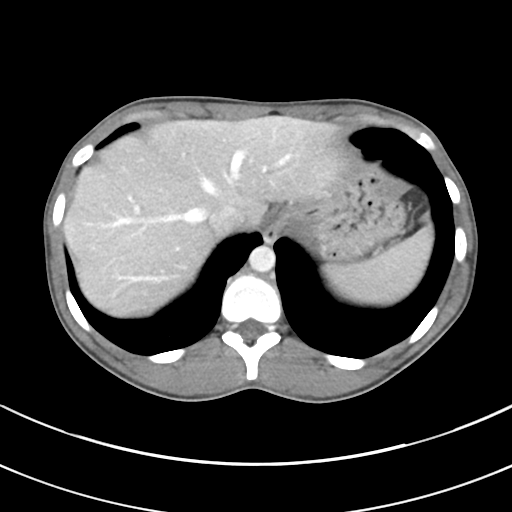
[im 79/83  soft-tissue]
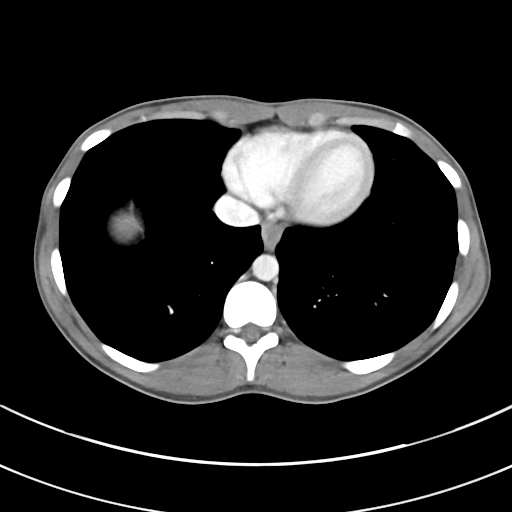

[Series 5: coronal · coronal · 0.54mm/px · 3 of 59 slices shown]
[im 20/59  soft-tissue]
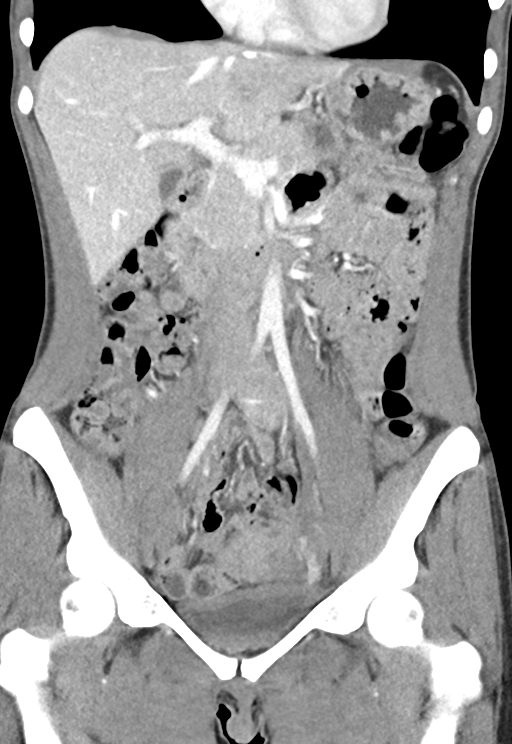
[im 26/59  soft-tissue]
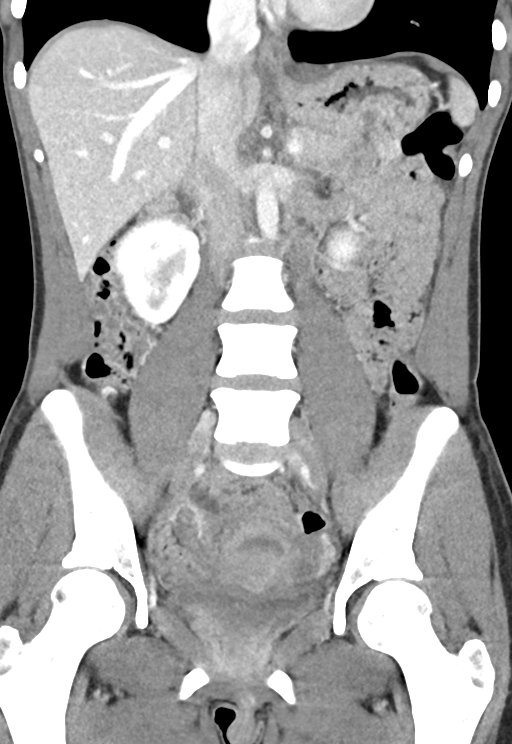
[im 33/59  soft-tissue]
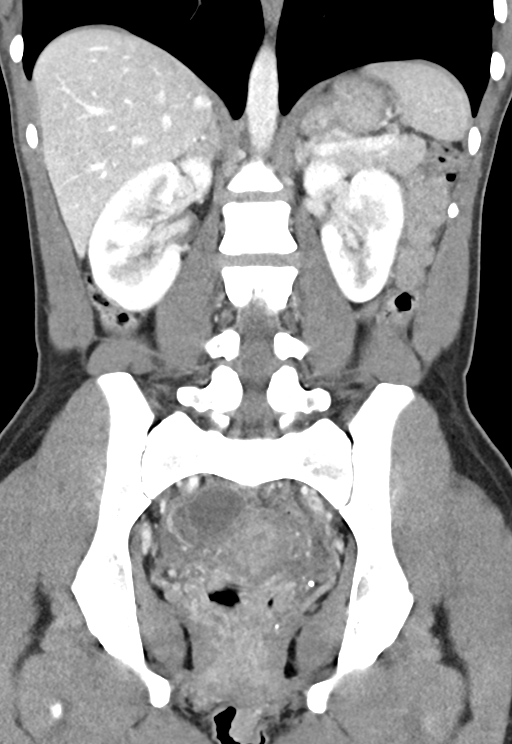

[15 of 46 positions shown; findings below may reference images not displayed]

RADIATION DOSE REDUCTION: This exam was performed according to the
departmental dose-optimization program which includes automated
exposure control, adjustment of the mA and/or kV according to
patient size and/or use of iterative reconstruction technique.

CONTRAST:  80mL OMNIPAQUE IOHEXOL 300 MG/ML  SOLN
FINDINGS: Lower chest: No acute abnormality.

Hepatobiliary: No focal liver abnormality is seen. No gallstones,
gallbladder wall thickening, or biliary dilatation.

Pancreas: Unremarkable. No pancreatic ductal dilatation or
surrounding inflammatory changes.

Spleen: Normal in size without focal abnormality.

Adrenals/Urinary Tract: Adrenal glands are unremarkable. Kidneys are
normal, without renal calculi, focal lesion, or hydronephrosis.
Bladder is unremarkable.

Stomach/Bowel: Stomach is within normal limits. Appendix appears
normal. No evidence of bowel wall thickening, distention, or
inflammatory changes.

Vascular/Lymphatic: No significant vascular findings are present. No
enlarged abdominal or pelvic lymph nodes.

Reproductive: Uterus is unremarkable. A 3.0 cm x 2.8 cm simple cyst
is seen along the expected region of the right adnexa.

Other: No abdominal wall hernia or abnormality. No abdominopelvic
ascites.

Musculoskeletal: No acute or significant osseous findings.
IMPRESSION: 3.0 cm x 2.8 cm simple cyst along the expected region of the right
adnexa, likely ovarian in origin. No follow-up imaging is
recommended. Reference: JACR [DATE]):[id number]

## 2022-11-04 ENCOUNTER — Other Ambulatory Visit: Payer: Self-pay | Admitting: Obstetrics and Gynecology

## 2022-11-04 ENCOUNTER — Encounter: Payer: Self-pay | Admitting: Advanced Practice Midwife

## 2022-11-04 ENCOUNTER — Ambulatory Visit (INDEPENDENT_AMBULATORY_CARE_PROVIDER_SITE_OTHER): Payer: Medicaid Other | Admitting: Advanced Practice Midwife

## 2022-11-04 ENCOUNTER — Ambulatory Visit (INDEPENDENT_AMBULATORY_CARE_PROVIDER_SITE_OTHER): Payer: Medicaid Other

## 2022-11-04 DIAGNOSIS — Z3A11 11 weeks gestation of pregnancy: Secondary | ICD-10-CM

## 2022-11-04 DIAGNOSIS — Z3491 Encounter for supervision of normal pregnancy, unspecified, first trimester: Secondary | ICD-10-CM

## 2022-11-04 DIAGNOSIS — O3430 Maternal care for cervical incompetence, unspecified trimester: Secondary | ICD-10-CM | POA: Diagnosis not present

## 2022-11-04 DIAGNOSIS — O0990 Supervision of high risk pregnancy, unspecified, unspecified trimester: Secondary | ICD-10-CM | POA: Diagnosis not present

## 2022-11-04 NOTE — Patient Instructions (Signed)
  CenteringPregnancy is a model of prenatal care that started 30 years ago and is used in about 600 practices around the [place]. You meet with a group of 8-12 women due around the same time as you. In Centering you will have individual time with the provider and meet as a group. There's much more time for discussion and learning. You will actually have much more time with your provider in Centering than in traditional prenatal care.? You will come directly into the Centering room and will not wait in the lobby so there is no wasted time. You will have 2-hour visits every 4 weeks then every 2 weeks. You will know your Centering prenatal appointments in advance. In your last month of pregnancy, you may also come in for some individual visits. Additional appointments can be scheduled if you need more care. Studies have shown that CenteringPregnancy improves birth outcomes. We have seen especially big improvements in fewer Black women delivering babies who are too small or born too early. Visit the website CenteringHealthcare for more information. Let your provider or clinic staff know if you want to sign up.    

## 2022-11-04 NOTE — Progress Notes (Unsigned)
Ultrasounds Results Note  SUBJECTIVE HPI:  Ms. CHALET KERWIN is a 26 y.o. [id number] at [redacted]w[redacted]d by {Dating:19197::"LMP","*** week US","Other ***"} who presents to [facility] for followup ultrasound results. The patient denies/reports abdominal pain or vaginal bleeding.  Upon review of the patient's records, patient was first seen in [facility] on *** for ***.     Previous HCG's ***  Previous US ***     Repeat ultrasound was performed earlier today.   Past Medical History:  Diagnosis Date   Anemia    Chlamydia 2010   Depression AGE 62   NO MEDS CURRENTLY   Gonorrhea 2010   Headache    Ovarian cyst 2013   Sickle cell trait (HCC)    Trichomonal vaginitis during pregnancy 01/19/2019   See on pap done at [facility] 01-2019   Trichomonas vaginalis infection 4/282015   Urinary tract infection    OCC   Past Surgical History:  Procedure Laterality Date   NO PAST SURGERIES     Social History   Socioeconomic History   Marital status: Single    Spouse name: Not on file   Number of children: Not on file   Years of education: 11+   Highest education level: Not on file  Occupational History   Not on file  Tobacco Use   Smoking status: Every Day    Packs/day: 0.25    Types: Cigarettes   Smokeless tobacco: Never  Vaping Use   Vaping Use: Never used  Substance and Sexual Activity   Alcohol use: Not Currently    Alcohol/week: 21.0 standard drinks of alcohol    Types: 21 Shots of liquor per week    Comment: 3 per day when not pregnant   Drug use: Yes    Frequency: 7.0 times per week    Types: Marijuana    Comment: daily   Sexual activity: Not Currently    Partners: Male    Birth control/protection: None  Other Topics Concern   Not on file  Social History Narrative   Not on file   Social Determinants of Health   Financial Resource Strain: Not on file  Food Insecurity: No Food Insecurity (10/21/2022)   Hunger Vital Sign    Worried About Running Out of Food in the Last  Year: Never true    Ran Out of Food in the Last Year: Never true  Transportation Needs: Unmet Transportation Needs (10/21/2022)   PRAPARE - Administrator, Civil Service (Medical): Yes    Lack of Transportation (Non-Medical): Yes  Physical Activity: Not on file  Stress: Not on file  Social Connections: Not on file  Intimate Partner Violence: Not on file   Current Outpatient Medications on File Prior to Visit  Medication Sig Dispense Refill   Prenatal Vit-Fe Fumarate-FA (PRENATAL PLUS VITAMIN/MINERAL) 27-1 MG TABS Take 1 tablet by mouth daily. 30 tablet 11   [DISCONTINUED] Prenatal Vit-Fe Fumarate-FA (PRENATAL 19) 29-1 MG CHEW Chew 1 tablet by mouth daily. 30 tablet 11   No current facility-administered medications on file prior to visit.   Allergies  Allergen Reactions   Tramadol Hives and Rash    Entire body rash    I have reviewed patient's Past Medical Hx, Surgical Hx, Family Hx, Social Hx, medications and allergies.   Review of Systems Review of Systems  Constitutional: Negative for fever and chills.  Gastrointestinal: Negative for abdominal pain.  Genitourinary: Negative for vaginal bleeding.  Musculoskeletal: Negative for back pain.  Neurological:  Negative for dizziness and weakness.    Physical Exam  LMP 08/15/2022 (Approximate)   Patient's last menstrual period was 08/15/2022 (approximate). GENERAL: Well-developed, well-nourished female in no acute distress.  HEENT: Normocephalic, atraumatic.   LUNGS: Effort normal ABDOMEN: Deferred HEART: Regular rate  SKIN: Warm, dry and without erythema PSYCH: Normal mood and affect NEURO: Alert and oriented x 4  LAB RESULTS No results found for this or any previous visit (from the past 24 hour(s)).  IMAGING No results found.  ASSESSMENT 1. [redacted] weeks gestation of pregnancy   2. Cervical insufficiency in pregnancy, antepartum   3. Normal IUP (intrauterine pregnancy) on prenatal ultrasound, first trimester      PLAN {F/U:19197::"Start prenatal care with provider of your choice","Go to [facility]U"} List of Ob/Gyn providers given. Repeat US *** Repeat Quant Hcg *** Patient advised to start/continue taking prenatal vitamins Go to [facility]U as needed for heavy bleeding, abdominal pain or fever greater than 100.4.  [place], CNM 11/04/2022 11:35 AM

## 2022-11-05 ENCOUNTER — Encounter: Payer: Self-pay | Admitting: Family Medicine

## 2022-11-05 ENCOUNTER — Ambulatory Visit (INDEPENDENT_AMBULATORY_CARE_PROVIDER_SITE_OTHER): Payer: Medicaid Other | Admitting: Family Medicine

## 2022-11-05 VITALS — BP 121/76 | HR 101 | Wt 113.6 lb

## 2022-11-05 DIAGNOSIS — O0991 Supervision of high risk pregnancy, unspecified, first trimester: Secondary | ICD-10-CM

## 2022-11-05 DIAGNOSIS — Z124 Encounter for screening for malignant neoplasm of cervix: Secondary | ICD-10-CM

## 2022-11-05 DIAGNOSIS — Z113 Encounter for screening for infections with a predominantly sexual mode of transmission: Secondary | ICD-10-CM | POA: Diagnosis not present

## 2022-11-05 DIAGNOSIS — O3430 Maternal care for cervical incompetence, unspecified trimester: Secondary | ICD-10-CM

## 2022-11-05 DIAGNOSIS — D582 Other hemoglobinopathies: Secondary | ICD-10-CM

## 2022-11-05 DIAGNOSIS — Z3A11 11 weeks gestation of pregnancy: Secondary | ICD-10-CM

## 2022-11-05 DIAGNOSIS — O0990 Supervision of high risk pregnancy, unspecified, unspecified trimester: Secondary | ICD-10-CM

## 2022-11-05 DIAGNOSIS — O3431 Maternal care for cervical incompetence, first trimester: Secondary | ICD-10-CM | POA: Diagnosis not present

## 2022-11-05 DIAGNOSIS — D573 Sickle-cell trait: Secondary | ICD-10-CM

## 2022-11-05 DIAGNOSIS — Z8751 Personal history of pre-term labor: Secondary | ICD-10-CM

## 2022-11-05 DIAGNOSIS — R112 Nausea with vomiting, unspecified: Secondary | ICD-10-CM

## 2022-11-05 MED ORDER — ONDANSETRON 4 MG PO TBDP: 4.0000 mg | ORAL_TABLET | Freq: Four times a day (QID) | ORAL | 5 refills | Status: DC | PRN

## 2022-11-05 MED ORDER — PROMETHAZINE HCL 25 MG PO TABS: 25.0000 mg | ORAL_TABLET | Freq: Four times a day (QID) | ORAL | 1 refills | Status: DC | PRN

## 2022-11-05 NOTE — Patient Instructions (Signed)
Second Trimester of Pregnancy  The second trimester of pregnancy is from week 13 through week 27. This is months 4 through 6 of pregnancy. The second trimester is often a time when you feel your best. Your body has adjusted to being pregnant, and you begin to feel better physically. During the second trimester: Morning sickness has lessened or stopped completely. You may have more energy. You may have an increase in appetite. The second trimester is also a time when the unborn baby (fetus) is growing rapidly. At the end of the sixth month, the fetus may be up to 12 inches long and weigh about 1 pounds. You will likely begin to feel the baby move (quickening) between 16 and 20 weeks of pregnancy. Body changes during your second trimester Your body continues to go through many changes during your second trimester. The changes vary and generally return to normal after the baby is born. Physical changes Your weight will continue to increase. You will notice your lower abdomen bulging out. You may begin to get stretch marks on your hips, abdomen, and breasts. Your breasts will continue to grow and to become tender. Dark spots or blotches (chloasma or mask of pregnancy) may develop on your face. A dark line from your belly button to the pubic area (linea nigra) may appear. You may have changes in your hair. These can include thickening of your hair, rapid growth, and changes in texture. Some people also have hair loss during or after pregnancy, or hair that feels dry or thin. Health changes You may develop headaches. You may have heartburn. You may develop constipation. You may develop hemorrhoids or swollen, bulging veins (varicose veins). Your gums may bleed and may be sensitive to brushing and flossing. You may urinate more often because the fetus is pressing on your bladder. You may have back pain. This is caused by: Weight gain. Pregnancy hormones that are relaxing the joints in your  pelvis. A shift in weight and the muscles that support your balance. Follow these instructions at home: Medicines Follow your health care provider's instructions regarding medicine use. Specific medicines may be either safe or unsafe to take during pregnancy. Do not take any medicines unless approved by your health care provider. Take a prenatal vitamin that contains at least 600 micrograms (mcg) of folic acid. Eating and drinking Eat a healthy diet that includes fresh fruits and vegetables, whole grains, good sources of protein such as meat, eggs, or tofu, and low-fat dairy products. Avoid raw meat and unpasteurized juice, milk, and cheese. These carry germs that can harm you and your baby. You may need to take these actions to prevent or treat constipation: Drink enough fluid to keep your urine pale yellow. Eat foods that are high in fiber, such as beans, whole grains, and fresh fruits and vegetables. Limit foods that are high in fat and processed sugars, such as fried or sweet foods. Activity Exercise only as directed by your health care provider. Most people can continue their usual exercise routine during pregnancy. Try to exercise for 30 minutes at least 5 days a week. Stop exercising if you develop contractions in your uterus. Stop exercising if you develop pain or cramping in the lower abdomen or lower back. Avoid exercising if it is very hot or humid or if you are at a high altitude. Avoid heavy lifting. If you choose to, you may have sex unless your health care provider tells you not to. Relieving pain and discomfort Wear a supportive   bra to prevent discomfort from breast tenderness. Take warm sitz baths to soothe any pain or discomfort caused by hemorrhoids. Use hemorrhoid cream if your health care provider approves. Rest with your legs raised (elevated) if you have leg cramps or low back pain. If you develop varicose veins: Wear support hose as told by your health care  provider. Elevate your feet for 15 minutes, 3-4 times a day. Limit salt in your diet. Safety Wear your seat belt at all times when driving or riding in a car. Talk with your health care provider if someone is verbally or physically abusive to you. Lifestyle Do not use hot tubs, steam rooms, or saunas. Do not douche. Do not use tampons or scented sanitary pads. Avoid cat litter boxes and soil used by cats. These carry germs that can cause birth defects in the baby and possibly loss of the fetus by miscarriage or stillbirth. Do not use herbal remedies, alcohol, illegal drugs, or medicines that are not approved by your health care provider. Chemicals in these products can harm your baby. Do not use any products that contain nicotine or tobacco, such as cigarettes, e-cigarettes, and chewing tobacco. If you need help quitting, ask your health care provider. General instructions During a routine prenatal visit, your health care provider will do a physical exam and other tests. He or she will also discuss your overall health. Keep all follow-up visits. This is important. Ask your health care provider for a referral to a local prenatal education class. Ask for help if you have counseling or nutritional needs during pregnancy. Your health care provider can offer advice or refer you to specialists for help with various needs. Where to find more information American Pregnancy Association: americanpregnancy.org American College of Obstetricians and Gynecologists: acog.org/en/Womens%20Health/Pregnancy Office on Women's Health: womenshealth.gov/pregnancy Contact a health care provider if you have: A headache that does not go away when you take medicine. Vision changes or you see spots in front of your eyes. Mild pelvic cramps, pelvic pressure, or nagging pain in the abdominal area. Persistent nausea, vomiting, or diarrhea. A bad-smelling vaginal discharge or foul-smelling urine. Pain when you  urinate. Sudden or extreme swelling of your face, hands, ankles, feet, or legs. A fever. Get help right away if you: Have fluid leaking from your vagina. Have spotting or bleeding from your vagina. Have severe abdominal cramping or pain. Have difficulty breathing. Have chest pain. Have fainting spells. Have not felt your baby move for the time period told by your health care provider. Have new or increased pain, swelling, or redness in an arm or leg. Summary The second trimester of pregnancy is from week 13 through week 27 (months 4 through 6). Do not use herbal remedies, alcohol, illegal drugs, or medicines that are not approved by your health care provider. Chemicals in these products can harm your baby. Exercise only as directed by your health care provider. Most people can continue their usual exercise routine during pregnancy. Keep all follow-up visits. This is important. This information is not intended to replace advice given to you by your health care provider. Make sure you discuss any questions you have with your health care provider. Document Revised: 05/02/2020 Document Reviewed: 03/08/2020 Elsevier Patient Education  2023 [employer] Inc.  Contraception Choices Contraception, also called birth control, refers to methods or devices that prevent pregnancy. Hormonal methods  Contraceptive implant A contraceptive implant is a thin, plastic tube that contains a hormone that prevents pregnancy. It is different from an intrauterine device (  IUD). It is inserted into the upper part of the arm by a health care provider. Implants can be effective for up to 3 years. Progestin-only injections Progestin-only injections are injections of progestin, a synthetic form of the hormone progesterone. They are given every 3 months by a health care provider. Birth control pills Birth control pills are pills that contain hormones that prevent pregnancy. They must be taken once a day, preferably at  the same time each day. A prescription is needed to use this method of contraception. Birth control patch The birth control patch contains hormones that prevent pregnancy. It is placed on the skin and must be changed once a week for three weeks and removed on the fourth week. A prescription is needed to use this method of contraception. Vaginal ring A vaginal ring contains hormones that prevent pregnancy. It is placed in the vagina for three weeks and removed on the fourth week. After that, the process is repeated with a new ring. A prescription is needed to use this method of contraception. Emergency contraceptive Emergency contraceptives prevent pregnancy after unprotected sex. They come in pill form and can be taken up to 5 days after sex. They work best the sooner they are taken after having sex. Most emergency contraceptives are available without a prescription. This method should not be used as your only form of birth control. Barrier methods  Female condom A female condom is a thin sheath that is worn over the penis during sex. Condoms keep sperm from going inside a woman's body. They can be used with a sperm-killing substance (spermicide) to increase their effectiveness. They should be thrown away after one use. Female condom A female condom is a soft, loose-fitting sheath that is put into the vagina before sex. The condom keeps sperm from going inside a woman's body. They should be thrown away after one use. Diaphragm A diaphragm is a soft, dome-shaped barrier. It is inserted into the vagina before sex, along with a spermicide. The diaphragm blocks sperm from entering the uterus, and the spermicide kills sperm. A diaphragm should be left in the vagina for 6-8 hours after sex and removed within 24 hours. A diaphragm is prescribed and fitted by a health care provider. A diaphragm should be replaced every 1-2 years, after giving birth, after gaining more than 15 lb (6.8 kg), and after pelvic  surgery. Cervical cap A cervical cap is a round, soft latex or plastic cup that fits over the cervix. It is inserted into the vagina before sex, along with spermicide. It blocks sperm from entering the uterus. The cap should be left in place for 6-8 hours after sex and removed within 48 hours. A cervical cap must be prescribed and fitted by a health care provider. It should be replaced every 2 years. Sponge A sponge is a soft, circular piece of polyurethane foam with spermicide in it. The sponge helps block sperm from entering the uterus, and the spermicide kills sperm. To use it, you make it wet and then insert it into the vagina. It should be inserted before sex, left in for at least 6 hours after sex, and removed and thrown away within 30 hours. Spermicides Spermicides are chemicals that kill or block sperm from entering the cervix and uterus. They can come as a cream, jelly, suppository, foam, or tablet. A spermicide should be inserted into the vagina with an applicator at least 10-15 minutes before sex to allow time for it to work. The process must be   repeated every time you have sex. Spermicides do not require a prescription. Intrauterine contraception Intrauterine device (IUD) An IUD is a T-shaped device that is put in a woman's uterus. There are two types: Hormone IUD.This type contains progestin, a synthetic form of the hormone progesterone. This type can stay in place for 3-5 years. Copper IUD.This type is wrapped in copper wire. It can stay in place for 10 years. Permanent methods of contraception Female tubal ligation In this method, a woman's fallopian tubes are sealed, tied, or blocked during surgery to prevent eggs from traveling to the uterus. Hysteroscopic sterilization In this method, a small, flexible insert is placed into each fallopian tube. The inserts cause scar tissue to form in the fallopian tubes and block them, so sperm cannot reach an egg. The procedure takes about 3  months to be effective. Another form of birth control must be used during those 3 months. Female sterilization This is a procedure to tie off the tubes that carry sperm (vasectomy). After the procedure, the man can still ejaculate fluid (semen). Another form of birth control must be used for 3 months after the procedure. Natural planning methods Natural family planning In this method, a couple does not have sex on days when the woman could become pregnant. Calendar method In this method, the woman keeps track of the length of each menstrual cycle, identifies the days when pregnancy can happen, and does not have sex on those days. Ovulation method In this method, a couple avoids sex during ovulation. Symptothermal method This method involves not having sex during ovulation. The woman typically checks for ovulation by watching changes in her temperature and in the consistency of cervical mucus. Post-ovulation method In this method, a couple waits to have sex until after ovulation. Where to find more information Centers for Disease Control and Prevention: www.cdc.gov Summary Contraception, also called birth control, refers to methods or devices that prevent pregnancy. Hormonal methods of contraception include implants, injections, pills, patches, vaginal rings, and emergency contraceptives. Barrier methods of contraception can include female condoms, female condoms, diaphragms, cervical caps, sponges, and spermicides. There are two types of IUDs (intrauterine devices). An IUD can be put in a woman's uterus to prevent pregnancy for 3-5 years. Permanent sterilization can be done through a procedure for males and females. Natural family planning methods involve nothaving sex on days when the woman could become pregnant. This information is not intended to replace advice given to you by your health care provider. Make sure you discuss any questions you have with your health care provider. Document  Revised: 04/30/2020 Document Reviewed: 04/30/2020 Elsevier Patient Education  2023 Elsevier Inc.  

## 2022-11-05 NOTE — Progress Notes (Signed)
Subjective:   Julia Roman is a 26 y.o. [phone] at [redacted]w[redacted]d by LMP, early ultrasound being seen today for her first obstetrical visit.  Her obstetrical history is significant for  hx of cervical insufficiency and preterm delivery . Patient does not intend to breast feed. Pregnancy history fully reviewed.  Patient reports no complaints.  HISTORY: OB History  Gravida Para Term Preterm AB Living  8 3 2 1 4 3   SAB IAB Ectopic Multiple Live Births  2 2 0 0 3    # Outcome Date GA Lbr Len/2nd Weight Sex Delivery Anes PTL Lv  8 Current           7 SAB 12/11/20 [redacted]w[redacted]d         6 IAB 09/07/20          5 IAB 05/08/20 [redacted]w[redacted]d         4 Term 08/09/19 [redacted]w[redacted]d  7 lb (3.175 kg) F Vag-Spont  N LIV  3 SAB 06/06/18 [redacted]w[redacted]d         2 Preterm 04/19/14 [redacted]w[redacted]d 02:12 / 00:14 2 lb 0.8 oz (0.93 kg) F Vag-Spont None  LIV     Name: Phariss,GIRL Sheresa     Apgar1: 7  Apgar5: 7  1 Term 08/11/13 [redacted]w[redacted]d 03:23 / 00:26 4 lb 15.7 oz (2.26 kg) M Vag-Spont EPI  LIV     Name: Voth,BOY Faithlyn     Apgar1: 8  Apgar5: 9     Last pap smear: Lab Results  Component Value Date   DIAGPAP  01/17/2019    NEGATIVE FOR INTRAEPITHELIAL LESIONS OR MALIGNANCY.   DIAGPAP TRICHOMONAS VAGINALIS PRESENT. 01/17/2019     Past Medical History:  Diagnosis Date   Anemia    Chlamydia 2010   Depression AGE 19   NO MEDS CURRENTLY   Gonorrhea 2010   Headache    Ovarian cyst 2013   Sickle cell trait ([facility])    Trichomonal vaginitis during pregnancy 01/19/2019   See on pap done at [facility] 01-2019   Trichomonas vaginalis infection 4/282015   Urinary tract infection    OCC   Past Surgical History:  Procedure Laterality Date   NO PAST SURGERIES     Family History  Problem Relation Age of Onset   Hypertension Mother    Asthma Mother    Fibromyalgia Mother    Sickle cell trait Father    Asthma Brother    Cancer Maternal Aunt        MOUTH   Hypertension Maternal Aunt    Seizures Maternal Aunt    COPD Maternal Aunt    Heart disease  Maternal Grandmother    Hyperlipidemia Maternal Grandmother    Hypertension Maternal Grandmother    Arthritis Maternal Grandfather    Diabetes Maternal Grandfather    Hypertension Maternal Grandfather    Kidney disease Maternal Grandfather    Stroke Maternal Grandfather    Diabetes Paternal Grandmother    Birth defects Cousin        CLEFT LIP   Cancer Cousin    Social History   Tobacco Use   Smoking status: Every Day    Packs/day: 0.25    Types: Cigarettes   Smokeless tobacco: Never  Vaping Use   Vaping Use: Never used  Substance Use Topics   Alcohol use: Not Currently    Alcohol/week: 21.0 standard drinks of alcohol    Types: 21 Shots of liquor per week    Comment: 3 per day when not  pregnant   Drug use: Yes    Frequency: 7.0 times per week    Types: Marijuana    Comment: daily   Allergies  Allergen Reactions   Tramadol Hives and Rash    Entire body rash   Current Outpatient Medications on File Prior to Visit  Medication Sig Dispense Refill   Prenatal Vit-Fe Fumarate-FA (PRENATAL PLUS VITAMIN/MINERAL) 27-1 MG TABS Take 1 tablet by mouth daily. 30 tablet 11   [DISCONTINUED] Prenatal Vit-Fe Fumarate-FA (PRENATAL 19) 29-1 MG CHEW Chew 1 tablet by mouth daily. 30 tablet 11   No current facility-administered medications on file prior to visit.     Exam   Vitals:   11/05/22 1438  BP: 121/76  Pulse: (!) 101  Weight: 113 lb 9.6 oz (51.5 kg)   Fetal Heart Rate (bpm): 167  System: General: well-developed, well-nourished female in no acute distress   Skin: normal coloration and turgor, no rashes   Neurologic: oriented, normal, negative, normal mood   Extremities: normal strength, tone, and muscle mass, ROM of all joints is normal   HEENT PERRLA, extraocular movement intact and sclera clear, anicteric   Neck supple and no masses   Respiratory:  no respiratory distress      Assessment:   Pregnancy: [id number] Patient Active Problem List   Diagnosis Date Noted    Cervical insufficiency in pregnancy, antepartum 11/04/2022   Supervision of high risk pregnancy, antepartum 10/21/2022   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 Supervision of high risk pregnancy, antepartum BP and FHR normal Initial labs drawn. Continue prenatal vitamins. Genetic Screening discussed, NIPS: ordered (only Panorama, Horizon obtained in prior pregnancy). Ultrasound discussed; fetal anatomic survey: ordered. Problem list reviewed and updated. The nature of [facility] with multiple MDs and other Advanced Practice Providers was explained to patient; also emphasized that residents, students are part of our team.  2. Screening for cervical cancer Due to provider running late prefers to obtain pap smear at next visit  3. Screening examination for sexually transmitted disease Self swab at next visit  4. Cervical insufficiency in pregnancy, antepartum Hx of 18 wk loss due to cervical insufficiency as well as 25 wk preterm delivery Meets criteria for hx based cerclage, message sent to schedule in 2-3 weeks Defer initiation of vaginal progesterone to provider at time of cerclage  5. Sickle cell trait ([place]) Declines partner testing  6. Hemoglobin C trait ([place])   7. History of preterm delivery     Routine obstetric precautions reviewed. Return in 4 weeks (on 12/03/2022) for [facility], ob visit.

## 2022-11-06 LAB — CBC/D/PLT+RPR+RH+ABO+RUBIGG...
Antibody Screen: NEGATIVE
Basophils Absolute: 0 10*3/uL (ref 0.0–0.2)
Basos: 0 %
EOS (ABSOLUTE): 0.1 10*3/uL (ref 0.0–0.4)
Eos: 1 %
HCV Ab: NONREACTIVE
HIV Screen 4th Generation wRfx: NONREACTIVE
Hematocrit: 37.6 % (ref 34.0–46.6)
Hemoglobin: 12.5 g/dL (ref 11.1–15.9)
Hepatitis B Surface Ag: NEGATIVE
Immature Grans (Abs): 0 10*3/uL (ref 0.0–0.1)
Immature Granulocytes: 0 %
Lymphocytes Absolute: 2.2 10*3/uL (ref 0.7–3.1)
Lymphs: 26 %
MCH: 25.3 pg — ABNORMAL LOW (ref 26.6–33.0)
MCHC: 33.2 g/dL (ref 31.5–35.7)
MCV: 76 fL — ABNORMAL LOW (ref 79–97)
Monocytes Absolute: 0.6 10*3/uL (ref 0.1–0.9)
Monocytes: 7 %
Neutrophils Absolute: 5.5 10*3/uL (ref 1.4–7.0)
Neutrophils: 66 %
Platelets: 333 10*3/uL (ref 150–450)
RBC: 4.95 x10E6/uL (ref 3.77–5.28)
RDW: 19.9 % — ABNORMAL HIGH (ref 11.7–15.4)
RPR Ser Ql: NONREACTIVE
Rh Factor: POSITIVE
Rubella Antibodies, IGG: 1.96 index (ref >0.99)
WBC: 8.5 10*3/uL (ref 3.4–10.8)

## 2022-11-06 LAB — HEMOGLOBIN A1C
Est. average glucose Bld gHb Est-mCnc: 105 mg/dL
Hgb A1c MFr Bld: 5.3 % (ref 4.8–5.6)

## 2022-11-06 LAB — HCV INTERPRETATION

## 2022-11-07 LAB — CULTURE, OB URINE

## 2022-11-07 LAB — URINE CULTURE, OB REFLEX: Organism ID, Bacteria: NO GROWTH

## 2022-11-12 LAB — PANORAMA PRENATAL TEST FULL PANEL:PANORAMA TEST PLUS 5 ADDITIONAL MICRODELETIONS: FETAL FRACTION: 6.7

## 2022-11-17 ENCOUNTER — Telehealth (HOSPITAL_COMMUNITY): Payer: Self-pay | Admitting: *Deleted

## 2022-11-17 NOTE — Telephone Encounter (Signed)
Preadmission screen  

## 2022-11-18 ENCOUNTER — Telehealth (HOSPITAL_COMMUNITY): Payer: Self-pay | Admitting: *Deleted

## 2022-11-18 NOTE — Telephone Encounter (Signed)
Preadmission screen  

## 2022-11-20 ENCOUNTER — Telehealth (HOSPITAL_COMMUNITY): Payer: Self-pay | Admitting: *Deleted

## 2022-11-20 NOTE — Telephone Encounter (Signed)
Preadmission screen Message left with arrival time and NPO status for procedure

## 2022-11-21 ENCOUNTER — Other Ambulatory Visit (HOSPITAL_COMMUNITY): Payer: Medicaid Other

## 2022-11-24 ENCOUNTER — Encounter (HOSPITAL_COMMUNITY)
Admission: RE | Disposition: A | Discharge status: Home / Self Care | Payer: Self-pay | Source: Home / Self Care | Attending: Obstetrics & Gynecology

## 2022-11-24 ENCOUNTER — Observation Stay (HOSPITAL_BASED_OUTPATIENT_CLINIC_OR_DEPARTMENT_OTHER): Payer: Medicaid Other | Admitting: Anesthesiology

## 2022-11-24 ENCOUNTER — Encounter (HOSPITAL_COMMUNITY): Payer: Self-pay | Admitting: Obstetrics & Gynecology

## 2022-11-24 ENCOUNTER — Ambulatory Visit (HOSPITAL_COMMUNITY)
Admission: RE | Admit: 2022-11-24 | Discharge: 2022-11-24 | Disposition: A | Discharge status: Home / Self Care | Payer: Medicaid Other | Attending: Obstetrics & Gynecology | Admitting: Obstetrics & Gynecology

## 2022-11-24 ENCOUNTER — Encounter: Payer: Self-pay | Admitting: *Deleted

## 2022-11-24 ENCOUNTER — Observation Stay (HOSPITAL_COMMUNITY): Payer: Medicaid Other | Admitting: Anesthesiology

## 2022-11-24 ENCOUNTER — Other Ambulatory Visit: Payer: Self-pay

## 2022-11-24 DIAGNOSIS — O3432 Maternal care for cervical incompetence, second trimester: Secondary | ICD-10-CM | POA: Insufficient documentation

## 2022-11-24 DIAGNOSIS — O3430 Maternal care for cervical incompetence, unspecified trimester: Secondary | ICD-10-CM | POA: Diagnosis present

## 2022-11-24 DIAGNOSIS — Z8759 Personal history of other complications of pregnancy, childbirth and the puerperium: Secondary | ICD-10-CM

## 2022-11-24 DIAGNOSIS — Z3A14 14 weeks gestation of pregnancy: Secondary | ICD-10-CM

## 2022-11-24 HISTORY — PX: CERVICAL CERCLAGE: SHX1329

## 2022-11-24 LAB — CBC
HCT: 29.8 % — ABNORMAL LOW (ref 36.0–46.0)
Hemoglobin: 11 g/dL — ABNORMAL LOW (ref 12.0–15.0)
MCH: 26.8 pg (ref 26.0–34.0)
MCHC: 36.9 g/dL — ABNORMAL HIGH (ref 30.0–36.0)
MCV: 72.7 fL — ABNORMAL LOW (ref 80.0–100.0)
Platelets: 269 10*3/uL (ref 150–400)
RBC: 4.1 MIL/uL (ref 3.87–5.11)
RDW: 17.9 % — ABNORMAL HIGH (ref 11.5–15.5)
WBC: 7.6 10*3/uL (ref 4.0–10.5)
nRBC: 0 % (ref 0.0–0.2)

## 2022-11-24 SURGERY — CERCLAGE, CERVIX, VAGINAL APPROACH
Anesthesia: Spinal

## 2022-11-24 MED ORDER — NALOXONE HCL 4 MG/10ML IJ SOLN: 1.0000 ug/kg/h | INTRAVENOUS | Status: DC | PRN

## 2022-11-24 MED ORDER — CHLOROPROCAINE HCL (PF) 3 % IJ SOLN
INTRAMUSCULAR | Status: AC
  Filled 2022-11-24: qty 20

## 2022-11-24 MED ORDER — OXYCODONE HCL 5 MG PO TABS: 5.0000 mg | ORAL_TABLET | Freq: Once | ORAL | Status: DC | PRN

## 2022-11-24 MED ORDER — CHLOROPROCAINE HCL (PF) 3 % IJ SOLN
INTRAMUSCULAR | Status: DC | PRN
  Administered 2022-11-24: 1.4 mL

## 2022-11-24 MED ORDER — OXYCODONE HCL 5 MG PO TABS: 5.0000 mg | ORAL_TABLET | ORAL | 0 refills | Status: DC | PRN

## 2022-11-24 MED ORDER — LACTATED RINGERS IV SOLN: INTRAVENOUS | Status: DC

## 2022-11-24 MED ORDER — NALOXONE HCL 0.4 MG/ML IJ SOLN: 0.4000 mg | INTRAMUSCULAR | Status: DC | PRN

## 2022-11-24 MED ORDER — SODIUM CHLORIDE 0.9 % IR SOLN
Status: DC | PRN
  Administered 2022-11-24: 1

## 2022-11-24 MED ORDER — ONDANSETRON HCL 4 MG/2ML IJ SOLN
INTRAMUSCULAR | Status: AC
  Filled 2022-11-24: qty 2

## 2022-11-24 MED ORDER — FENTANYL CITRATE (PF) 100 MCG/2ML IJ SOLN
INTRAMUSCULAR | Status: DC | PRN
  Administered 2022-11-24: 25 ug via INTRATHECAL

## 2022-11-24 MED ORDER — ACETAMINOPHEN 10 MG/ML IV SOLN: 1000.0000 mg | Freq: Once | INTRAVENOUS | Status: DC | PRN

## 2022-11-24 MED ORDER — FENTANYL CITRATE (PF) 100 MCG/2ML IJ SOLN
INTRAMUSCULAR | Status: AC
  Filled 2022-11-24: qty 2

## 2022-11-24 MED ORDER — OXYCODONE HCL 5 MG/5ML PO SOLN: 5.0000 mg | Freq: Once | ORAL | Status: DC | PRN

## 2022-11-24 MED ORDER — ONDANSETRON HCL 4 MG/2ML IJ SOLN
INTRAMUSCULAR | Status: DC | PRN
  Administered 2022-11-24: 4 mg via INTRAVENOUS

## 2022-11-24 MED ORDER — INDOMETHACIN 50 MG RE SUPP
100.0000 mg | Freq: Once | RECTAL | Status: DC
  Filled 2022-11-24: qty 2

## 2022-11-24 MED ORDER — DOCUSATE SODIUM 100 MG PO CAPS: 100.0000 mg | ORAL_CAPSULE | Freq: Two times a day (BID) | ORAL | 2 refills | Status: DC | PRN

## 2022-11-24 MED ORDER — FENTANYL CITRATE (PF) 100 MCG/2ML IJ SOLN: 25.0000 ug | INTRAMUSCULAR | Status: DC | PRN

## 2022-11-24 MED ORDER — ONDANSETRON HCL 4 MG/2ML IJ SOLN: 4.0000 mg | Freq: Three times a day (TID) | INTRAMUSCULAR | Status: DC | PRN

## 2022-11-24 MED ORDER — DIPHENHYDRAMINE HCL 25 MG PO CAPS: 25.0000 mg | ORAL_CAPSULE | ORAL | Status: DC | PRN

## 2022-11-24 MED ORDER — DIPHENHYDRAMINE HCL 50 MG/ML IJ SOLN: 12.5000 mg | INTRAMUSCULAR | Status: DC | PRN

## 2022-11-24 MED ORDER — SODIUM CHLORIDE 0.9% FLUSH: 3.0000 mL | INTRAVENOUS | Status: DC | PRN

## 2022-11-24 MED ORDER — INDOMETHACIN 50 MG RE SUPP
RECTAL | Status: DC | PRN
  Administered 2022-11-24: 100 mg via RECTAL

## 2022-11-24 SURGICAL SUPPLY — 16 items
CANISTER SUCT 3000ML PPV (MISCELLANEOUS) ×2 IMPLANT
ELECT REM PT RETURN 9FT ADLT (ELECTROSURGICAL)
ELECTRODE REM PT RTRN 9FT ADLT (ELECTROSURGICAL) IMPLANT
GLOVE BIOGEL PI IND STRL 7.0 (GLOVE) ×6 IMPLANT
GLOVE ECLIPSE 7.0 STRL STRAW (GLOVE) ×2 IMPLANT
GOWN STRL REUS W/TWL LRG LVL3 (GOWN DISPOSABLE) ×4 IMPLANT
PACK VAGINAL MINOR WOMEN LF (CUSTOM PROCEDURE TRAY) ×2 IMPLANT
PAD OB MATERNITY 4.3X12.25 (PERSONAL CARE ITEMS) ×2 IMPLANT
PAD PREP 24X48 CUFFED NSTRL (MISCELLANEOUS) ×2 IMPLANT
PENCIL BUTTON HOLSTER BLD 10FT (ELECTRODE) IMPLANT
SUT PROLENE 1 CT 1 30 (SUTURE) ×2 IMPLANT
SYR BULB IRRIGATION 50ML (SYRINGE) IMPLANT
TOWEL OR 17X24 6PK STRL BLUE (TOWEL DISPOSABLE) ×4 IMPLANT
TRAY FOLEY W/BAG SLVR 14FR (SET/KITS/TRAYS/PACK) ×2 IMPLANT
TUBING NON-CON 1/4 X 20 CONN (TUBING) IMPLANT
YANKAUER SUCT BULB TIP NO VENT (SUCTIONS) IMPLANT

## 2022-11-24 NOTE — Anesthesia Preprocedure Evaluation (Signed)
Anesthesia Evaluation  Patient identified by MRN, date of birth, ID band Patient awake    Reviewed: Allergy & Precautions, H&P , NPO status , Patient's Chart, lab work & pertinent test results  History of Anesthesia Complications Negative for: history of anesthetic complications  Airway Mallampati: II  TM Distance: >3 FB     Dental   Pulmonary Current Smoker and Patient abstained from smoking.   Pulmonary exam normal        Cardiovascular negative cardio ROS  Rhythm:regular Rate:Normal     Neuro/Psych negative neurological ROS  negative psych ROS   GI/Hepatic negative GI ROS, Neg liver ROS,,,  Endo/Other  negative endocrine ROS    Renal/GU negative Renal ROS  negative genitourinary   Musculoskeletal   Abdominal   Peds  Hematology  (+) Blood dyscrasia, anemia   Anesthesia Other Findings Cervical incompetence  Reproductive/Obstetrics (+) Pregnancy [id number] at [redacted]w[redacted]d                              Anesthesia Physical Anesthesia Plan  ASA: 2  Anesthesia Plan: Spinal   Post-op Pain Management:    Induction:   PONV Risk Score and Plan: Ondansetron and Treatment may vary due to age or medical condition  Airway Management Planned:   Additional Equipment:   Intra-op Plan:   Post-operative Plan:   Informed Consent: I have reviewed the patients History and Physical, chart, labs and discussed the procedure including the risks, benefits and alternatives for the proposed anesthesia with the patient or authorized representative who has indicated his/her understanding and acceptance.       Plan Discussed with: Anesthesiologist  Anesthesia Plan Comments:          Anesthesia Quick Evaluation

## 2022-11-24 NOTE — Op Note (Signed)
[name]   PROCEDURE DATE: 11/24/2022  PREOPERATIVE DIAGNOSIS: Intrauterine pregnancy at [redacted]w[redacted]d, history of cervical incompetence   POSTOPERATIVE DIAGNOSIS: The same PROCEDURE: Transvaginal McDonald Cervical Cerclage Placement SURGEON:  Dr. Jaynie Collins  INDICATIONS: 26 y.o. [id number] at [redacted]w[redacted]d with history of cervical incompetence, here for cerclage placement.   The risks of surgery were discussed in detail with the patient including but not limited to: bleeding; infection which may require antibiotic therapy; injury to cervix, vagina other surrounding organs; risk of ruptured membranes and/or preterm delivery and other postoperative or anesthesia complications.  Written informed consent was obtained.    FINDINGS:  About 2 cm palpable cervical length in the vagina, closed cervix, suture knot placed anteriorly.  ANESTHESIA:  Spinal ESTIMATED BLOOD LOSS: 5 ml COMPLICATIONS: None immediate  PROCEDURE IN DETAIL:  The patient received intravenous antibiotics and had sequential compression devices applied to her lower extremities while in the preoperative area.  Reassuring fetal heart rate was also obtained using a doppler. She was then taken to the operating room where spinal anesthesia was administered and was found to be adequate.  She was placed in the dorsal lithotomy, and was prepped and draped in a sterile manner. Her bladder was catheterized for an unmeasured amount of clear, yellow urine.  After an adequate timeout was performed, a vaginal speculum was then placed in the patient's vagina.  The anterior and posterior lips of the cervix were grasped with ring forceps. A curved needle with a 1-0 Prolene suture was inserted at 12 o'clock, as high as possible at the junction of the rugated vaginal epithelium and the smooth cervix, at least 2 cm above the external os.  Four bites are taken circumferentially around the entire cervix in a purse-string fashion, each bite should be deep enough to  extend at least midway into the cervical stroma, but not into the endocervical canal. The two ends of the suture were then tied securely anteriorly and cut, leaving the ends long enough to grasp with a clamp when it is time to remove it. A paracervical block using 0.5 % Marcaine was administered. There was minimal bleeding noted and the ring forceps were removed with good hemostasis noted.  No immediate complications noted.  All instruments were removed from the patient's vagina.  Indomethacin 100 mg rectal suppository was placed.  Instrument, needle and sponge counts were correct x 2. The patient tolerated the procedure well, and was taken to the recovery area awake and in stable condition.  Reassuring fetal heart rate was also obtained using a doppler in the recovery area.  The patient will be discharged to home as per PACU criteria.  Routine postoperative instructions given.  She was prescribed Oxycodone and Colace.  She will follow up in the clinic on 12/03/22 for postoperative evaluation and ongoing prenatal care.   Jaynie Collins, MD, FACOG Obstetrician & Gynecologist, [facility] for [employer], [facility]

## 2022-11-24 NOTE — Anesthesia Procedure Notes (Signed)
Spinal  Patient location during procedure: OR Reason for block: surgical anesthesia Staffing Performed: anesthesiologist  Anesthesiologist: Taurus Willis E, MD Performed by: Danea Manter E, MD Authorized by: Jonet Mathies E, MD   Preanesthetic Checklist Completed: patient identified, IV checked, risks and benefits discussed, surgical consent, monitors and equipment checked, pre-op evaluation and timeout performed Spinal Block Patient position: sitting Prep: DuraPrep and site prepped and draped Patient monitoring: continuous pulse ox, blood pressure and heart rate Approach: midline Location: L3-4 Injection technique: single-shot Needle Needle type: Pencan  Needle gauge: 24 G Needle length: 10 cm Assessment Events: CSF return Additional Notes Functioning IV was confirmed and monitors were applied. Sterile prep and drape, including hand hygiene and sterile gloves were used. The patient was positioned and the spine was prepped. The skin was anesthetized with lidocaine.  Free flow of clear CSF was obtained prior to injecting local anesthetic into the CSF. The needle was carefully withdrawn. The patient tolerated the procedure well.      

## 2022-11-24 NOTE — Anesthesia Postprocedure Evaluation (Signed)
Anesthesia Post Note  Patient: Julia Roman  Procedure(s) Performed: CERCLAGE CERVICAL     Patient location during evaluation: PACU Anesthesia Type: Spinal Level of consciousness: awake and alert Pain management: pain level controlled Vital Signs Assessment: post-procedure vital signs reviewed and stable Respiratory status: spontaneous breathing, nonlabored ventilation and respiratory function stable Cardiovascular status: blood pressure returned to baseline and stable Postop Assessment: no apparent nausea or vomiting, no headache, no backache and spinal receding Anesthetic complications: no   No notable events documented.  Last Vitals:  Vitals:   11/24/22 1115 11/24/22 1130  BP: 132/73   Pulse: 78 78  Resp: 16 19  Temp:    SpO2: 100% 100%    Last Pain:  Vitals:   11/24/22 1115  TempSrc:   PainSc: 0-No pain   Pain Goal:    LLE Motor Response: Purposeful movement (11/24/22 1115) LLE Sensation: Tingling (11/24/22 1115) RLE Motor Response: Purposeful movement (11/24/22 1115) RLE Sensation: Tingling (11/24/22 1115)     Epidural/Spinal Function Cutaneous sensation: Tingles (11/24/22 1115), Patient able to flex knees: No (11/24/22 1115), Patient able to lift hips off bed: No (11/24/22 1115), Back pain beyond tenderness at insertion site: No (11/24/22 1115), Progressively worsening motor and/or sensory loss: No (11/24/22 1115), Bowel and/or bladder incontinence post epidural: No (11/24/22 1115)  Lucretia Kern

## 2022-11-24 NOTE — Transfer of Care (Signed)
Immediate Anesthesia Transfer of Care Note  Patient: Julia Roman  Procedure(s) Performed: CERCLAGE CERVICAL  Patient Location: PACU  Anesthesia Type:Spinal  Level of Consciousness: awake  Airway & Oxygen Therapy: Patient Spontanous Breathing  Post-op Assessment: Report given to RN and Post -op Vital signs reviewed and stable  Post vital signs: Reviewed and stable  Last Vitals:  Vitals Value Taken Time  BP 120/57 11/24/22 1043  Temp 36.5 C 11/24/22 1044  Pulse 62 11/24/22 1045  Resp 22 11/24/22 1045  SpO2 100 % 11/24/22 1045  Vitals shown include unvalidated device data.  Last Pain:  Vitals:   11/24/22 1044  TempSrc: Oral         Complications: No notable events documented.

## 2022-11-24 NOTE — H&P (Signed)
Preoperative History and Physical  Julia Roman is a 26 y.o. [phone] at [redacted]w[redacted]d here for surgical management of history of cervical incompetence.   No significant preoperative concerns.  Proposed surgery: Transvaginal cervical cerclage  Past Medical History:  Diagnosis Date   Anemia    Chlamydia 2010   Depression AGE 61   NO MEDS CURRENTLY   Gonorrhea 2010   Headache    Ovarian cyst 2013   Sickle cell trait (HCC)    Trichomonal vaginitis during pregnancy 01/19/2019   See on pap done at [facility] 01-2019   Trichomonas vaginalis infection 4/282015   Urinary tract infection    OCC   Past Surgical History:  Procedure Laterality Date   NO PAST SURGERIES     OB History  Gravida Para Term Preterm AB Living  8 3 2 1 4 3   SAB IAB Ectopic Multiple Live Births  2 2     3     # Outcome Date GA Lbr Len/2nd Weight Sex Delivery Anes PTL Lv  8 Current           7 SAB 12/11/20 [redacted]w[redacted]d         6 IAB 09/07/20          5 IAB 05/08/20 [redacted]w[redacted]d         4 Term 08/09/19 [redacted]w[redacted]d  3175 g F Vag-Spont  N LIV  3 SAB 06/06/18 [redacted]w[redacted]d         2 Preterm 04/19/14 [redacted]w[redacted]d 02:12 / 00:14 930 g F Vag-Spont None  LIV  1 Term 08/11/13 [redacted]w[redacted]d 03:23 / 00:26 2260 g M Vag-Spont EPI  LIV  Patient denies any other pertinent gynecologic issues.   No current facility-administered medications on file prior to encounter.   Current Outpatient Medications on File Prior to Encounter  Medication Sig Dispense Refill   acetaminophen (TYLENOL) 500 MG tablet Take 500-1,000 mg by mouth every 6 (six) hours as needed (pain.).     Prenatal Vit-Fe Fumarate-FA (PRENATAL PLUS VITAMIN/MINERAL) 27-1 MG TABS Take 1 tablet by mouth daily. 30 tablet 11   promethazine (PHENERGAN) 25 MG tablet Take 1 tablet (25 mg total) by mouth every 6 (six) hours as needed for nausea or vomiting. 30 tablet 1   ondansetron (ZOFRAN-ODT) 4 MG disintegrating tablet Take 1 tablet (4 mg total) by mouth every 6 (six) hours as needed for nausea. (Patient not taking:  Reported on 11/18/2022) 20 tablet 5   [DISCONTINUED] Prenatal Vit-Fe Fumarate-FA (PRENATAL 19) 29-1 MG CHEW Chew 1 tablet by mouth daily. 30 tablet 11   Allergies  Allergen Reactions   Tramadol Hives and Rash    Entire body rash    Social History:   reports that she has been smoking cigarettes. She has been smoking an average of .25 packs per day. She has never used smokeless tobacco. She reports that she does not currently use alcohol after a past usage of about 21.0 standard drinks of alcohol per week. She reports current drug use. Frequency: 7.00 times per week. Drug: Marijuana.  Family History  Problem Relation Age of Onset   Hypertension Mother    Asthma Mother    Fibromyalgia Mother    Sickle cell trait Father    Asthma Brother    Cancer Maternal Aunt        MOUTH   Hypertension Maternal Aunt    Seizures Maternal Aunt    COPD Maternal Aunt    Heart disease Maternal Grandmother    Hyperlipidemia Maternal Grandmother  Hypertension Maternal Grandmother    Arthritis Maternal Grandfather    Diabetes Maternal Grandfather    Hypertension Maternal Grandfather    Kidney disease Maternal Grandfather    Stroke Maternal Grandfather    Diabetes Paternal Grandmother    Birth defects Cousin        CLEFT LIP   Cancer Cousin     Review of Systems: Pertinent items noted in HPI and remainder of comprehensive ROS otherwise negative.  PHYSICAL EXAM: Blood pressure 111/61, pulse 67, temperature 98.2 F (36.8 C), temperature source Oral, resp. rate 18, height 5\' 2"  (1.575 m), weight 52 kg, last menstrual period 08/15/2022, SpO2 100 %. CONSTITUTIONAL: Well-developed, well-nourished female in no acute distress.  HENT:  Normocephalic, atraumatic, External right and left ear normal. Oropharynx is clear and moist EYES: Conjunctivae and EOM are normal. Pupils are equal, round, and reactive to light. No scleral icterus.  NECK: Normal range of motion, supple, no masses SKIN: Skin is warm and  dry. No rash noted. Not diaphoretic. No erythema. No pallor. NEUROLOGIC: Alert and oriented to person, place, and time. Normal reflexes, muscle tone coordination. No cranial nerve deficit noted. PSYCHIATRIC: Normal mood and affect. Normal behavior. Normal judgment and thought content. CARDIOVASCULAR: Normal heart rate noted, regular rhythm RESPIRATORY: Effort and breath sounds normal, no problems with respiration noted ABDOMEN: Soft, nontender, nondistended. PELVIC: Deferred MUSCULOSKELETAL: Normal range of motion. No edema and no tenderness. 2+ distal pulses.  Labs: Results for orders placed or performed during the hospital encounter of 11/24/22 (from the past 336 hour(s))  CBC   Collection Time: 11/24/22  8:01 AM  Result Value Ref Range   WBC 7.6 4.0 - 10.5 K/uL   RBC 4.10 3.87 - 5.11 MIL/uL   Hemoglobin 11.0 (L) 12.0 - 15.0 g/dL   HCT 11/26/22 (L) 53.6 - 64.4 %   MCV 72.7 (L) 80.0 - 100.0 fL   MCH 26.8 26.0 - 34.0 pg   MCHC 36.9 (H) 30.0 - 36.0 g/dL   RDW 03.4 (H) 74.2 - 59.5 %   Platelets 269 150 - 400 K/uL   nRBC 0.0 0.0 - 0.2 %    Imaging Studies: 63.8 OB Comp Less 14 Wks  Result Date: 11/09/2022 CLINICAL DATA:  26 yo for OB viability scan Exam: OBSTETRIC <14 WK 34 and TRANSVAGINAL OB US Technique:  Transvaginal ultrasound examination was performed for complete evaluation of the gestation as well at the maternal uterus, adnexal regions, and pelvic cul-de-sac.  Transvaginal technique was performed to assess early pregnancy. 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  The risks of surgery were discussed in detail with the patient including but not limited to: bleeding; infection which may require antibiotic therapy; injury to cervix, vagina other surrounding organs; risk of ruptured membranes and/or preterm delivery and other postoperative or  anesthesia complications. Likelihood of success in alleviating the patient's condition was discussed. Routine postoperative instructions will be reviewed with the patient and her family in detail after surgery.  The patient concurred with the proposed plan, giving informed written consent for the surgery.  Patient has been NPO since last night and she will remain NPO for procedure.  Anesthesia and OR aware.  Preoperative SCDs ordered on call to the OR.  To OR when ready.    Jaynie Collins, MD, FACOG Obstetrician & Gynecologist, [facility] for [employer], [facility]

## 2022-11-25 ENCOUNTER — Encounter (HOSPITAL_COMMUNITY): Payer: Self-pay | Admitting: Obstetrics & Gynecology

## 2022-12-03 ENCOUNTER — Encounter: Payer: Medicaid Other | Admitting: Advanced Practice Midwife

## 2022-12-03 ENCOUNTER — Ambulatory Visit (INDEPENDENT_AMBULATORY_CARE_PROVIDER_SITE_OTHER): Payer: Medicaid Other | Admitting: Family Medicine

## 2022-12-03 VITALS — BP 124/89 | HR 90 | Wt 114.0 lb

## 2022-12-03 DIAGNOSIS — O3432 Maternal care for cervical incompetence, second trimester: Secondary | ICD-10-CM

## 2022-12-03 DIAGNOSIS — Z3A15 15 weeks gestation of pregnancy: Secondary | ICD-10-CM

## 2022-12-03 DIAGNOSIS — D573 Sickle-cell trait: Secondary | ICD-10-CM

## 2022-12-03 DIAGNOSIS — O0992 Supervision of high risk pregnancy, unspecified, second trimester: Secondary | ICD-10-CM

## 2022-12-03 DIAGNOSIS — O3430 Maternal care for cervical incompetence, unspecified trimester: Secondary | ICD-10-CM

## 2022-12-03 DIAGNOSIS — Z8751 Personal history of pre-term labor: Secondary | ICD-10-CM

## 2022-12-03 DIAGNOSIS — O0990 Supervision of high risk pregnancy, unspecified, unspecified trimester: Secondary | ICD-10-CM

## 2022-12-03 NOTE — Progress Notes (Signed)
   PRENATAL VISIT NOTE  Subjective:  Julia Roman is a 26 y.o. [phone] at [redacted]w[redacted]d being seen today for ongoing prenatal care.  She is currently monitored for the following issues for this high-risk pregnancy and has History of depression; Hemoglobin C trait (HCC); History of preterm delivery; Sickle cell trait (HCC); Recurrent pregnancy loss; Supervision of high risk pregnancy, antepartum; Cervical insufficiency in pregnancy, antepartum; and Cervical cerclage suture present, placed 11/24/22 on their problem list.  Patient reports no complaints.  Contractions: Not present. Vag. Bleeding: None.  Movement: Present. Denies leaking of fluid.   The following portions of the patient's history were reviewed and updated as appropriate: allergies, current medications, past family history, past medical history, past social history, past surgical history and problem list.   Objective:   Vitals:   12/03/22 1430  BP: 124/89  Pulse: 90  Weight: 114 lb (51.7 kg)    Fetal Status: Fetal Heart Rate (bpm): 167   Movement: Present     General:  Alert, oriented and cooperative. Patient is in no acute distress.  Skin: Skin is warm and dry. No rash noted.   Cardiovascular: Normal heart rate noted  Respiratory: Normal respiratory effort, no problems with respiration noted  Abdomen: Soft, gravid, appropriate for gestational age.  Pain/Pressure: Absent     Pelvic: Cervical exam deferred        Extremities: Normal range of motion.     Mental Status: Normal mood and affect. Normal behavior. Normal judgment and thought content.   Assessment and Plan:  Pregnancy: [id number] at [redacted]w[redacted]d 1. Supervision of high risk pregnancy, antepartum FHT and FH normal - AFP, Serum, Open Spina Bifida  2. Cervical cerclage suture present in second trimester No problems - no discharge, bleeding, cramping  3. History of preterm delivery  4. Sickle cell trait (HCC)  5. Cervical insufficiency in pregnancy, antepartum   Preterm  labor symptoms and general obstetric precautions including but not limited to vaginal bleeding, contractions, leaking of fluid and fetal movement were reviewed in detail with the patient. Please refer to After Visit Summary for other counseling recommendations.   No follow-ups on file.  Future Appointments  Date Time Provider Department Center  12/03/2022  2:55 PM Noralee Chars [facility]  12/26/2022  2:30 PM [facility] NURSE [facility]  12/26/2022  2:45 PM [facility] US4 [facility]    Levie Heritage, DO

## 2022-12-05 LAB — AFP, SERUM, OPEN SPINA BIFIDA
AFP MoM: 1.81
AFP Value: 75.8 ng/mL
Gest. Age on Collection Date: 15.5 weeks
Maternal Age At EDD: 28.2 yr
OSBR Risk 1 IN: 2495
Test Results:: NEGATIVE
Weight: 114 [lb_av]

## 2022-12-08 NOTE — L&D Delivery Note (Signed)
Delivery Note TRENISHA RUNCK is a 27 y.o. [phone] at [redacted]w[redacted]d admitted for active labor.   GBS Status: Negative/-- (05/30 0442) Maximum Maternal Temperature: not taken  Labor course: Initial SVE: 6.5/80/-2. Augmentation with:  nothing . She then progressed quickly w/in 1hr to complete.  SROM: 0h 23m with clear fluid  Birth: In hands & knees position, at 0618 a viable female was delivered via spontaneous vaginal delivery (Presentation: LOA). Nuchal cord present: No.  Shoulders and body delivered in usual fashion. Infant placed directly on mom's abdomen for bonding/skin-to-skin, baby dried and stimulated. Cord clamped x 2 after 1 minute and cut by pt's mom.  Cord blood collected.  The placenta separated spontaneously and delivered via gentle cord traction.  Pitocin infused rapidly IV per protocol.  Fundus firm with massage.  Placenta inspected and appears to be intact with a 3 VC.  Placenta/Cord with the following complications: near marginal cord insertion .  Cord pH: not done Sponge and instrument count were correct x2.  Intrapartum complications:  precipitous labor Anesthesia:   IV fentanyl x 1 Episiotomy: none Lacerations:  hemostatic Lt periurethral, not repaired Suture Repair:  n/a EBL (mL):211   Infant: APGAR (1 MIN):  see delivery summary APGAR (5 MINS):   APGAR (10 MINS):    Infant weight: pending  Delivery Report: Review the Delivery Report for details.    Mom to postpartum.  Baby to Couplet care / Skin to Skin. Placenta to L&D   Plans to Breast and bottlefeed Contraception: interval BTL, hasn't signed consent Circumcision: wants inpatient  Note sent to [facility]: [facility] for pp visit.  Cheral Marker CNM, [facility] 05/13/2023 6:49 AM

## 2022-12-26 ENCOUNTER — Other Ambulatory Visit: Payer: Self-pay | Admitting: Family Medicine

## 2022-12-26 ENCOUNTER — Ambulatory Visit: Payer: Medicaid Other | Admitting: *Deleted

## 2022-12-26 ENCOUNTER — Ambulatory Visit: Payer: Medicaid Other | Attending: Maternal & Fetal Medicine

## 2022-12-26 VITALS — BP 143/90 | HR 95

## 2022-12-26 DIAGNOSIS — D573 Sickle-cell trait: Secondary | ICD-10-CM | POA: Diagnosis not present

## 2022-12-26 DIAGNOSIS — O09212 Supervision of pregnancy with history of pre-term labor, second trimester: Secondary | ICD-10-CM

## 2022-12-26 DIAGNOSIS — O3432 Maternal care for cervical incompetence, second trimester: Secondary | ICD-10-CM | POA: Diagnosis not present

## 2022-12-26 DIAGNOSIS — O285 Abnormal chromosomal and genetic finding on antenatal screening of mother: Secondary | ICD-10-CM | POA: Diagnosis not present

## 2022-12-26 DIAGNOSIS — O0990 Supervision of high risk pregnancy, unspecified, unspecified trimester: Secondary | ICD-10-CM

## 2022-12-26 DIAGNOSIS — Z3A19 19 weeks gestation of pregnancy: Secondary | ICD-10-CM

## 2022-12-29 ENCOUNTER — Other Ambulatory Visit: Payer: Self-pay | Admitting: *Deleted

## 2022-12-29 DIAGNOSIS — O09899 Supervision of other high risk pregnancies, unspecified trimester: Secondary | ICD-10-CM

## 2022-12-29 DIAGNOSIS — O3432 Maternal care for cervical incompetence, second trimester: Secondary | ICD-10-CM

## 2022-12-29 DIAGNOSIS — O3430 Maternal care for cervical incompetence, unspecified trimester: Secondary | ICD-10-CM

## 2022-12-31 ENCOUNTER — Encounter: Payer: Medicaid Other | Admitting: Medical

## 2023-01-02 ENCOUNTER — Ambulatory Visit: Payer: Medicaid Other | Admitting: Certified Nurse Midwife

## 2023-01-02 DIAGNOSIS — Z8751 Personal history of pre-term labor: Secondary | ICD-10-CM

## 2023-01-02 DIAGNOSIS — O0992 Supervision of high risk pregnancy, unspecified, second trimester: Secondary | ICD-10-CM

## 2023-01-02 DIAGNOSIS — Z3A20 20 weeks gestation of pregnancy: Secondary | ICD-10-CM

## 2023-01-02 DIAGNOSIS — O3432 Maternal care for cervical incompetence, second trimester: Secondary | ICD-10-CM

## 2023-01-02 NOTE — Progress Notes (Signed)
Pt did not come to appt

## 2023-01-07 ENCOUNTER — Ambulatory Visit: Payer: Medicaid Other | Admitting: Certified Nurse Midwife

## 2023-01-07 DIAGNOSIS — O0992 Supervision of high risk pregnancy, unspecified, second trimester: Secondary | ICD-10-CM

## 2023-01-07 DIAGNOSIS — Z3A20 20 weeks gestation of pregnancy: Secondary | ICD-10-CM

## 2023-01-07 DIAGNOSIS — O3432 Maternal care for cervical incompetence, second trimester: Secondary | ICD-10-CM

## 2023-01-07 DIAGNOSIS — Z8751 Personal history of pre-term labor: Secondary | ICD-10-CM

## 2023-01-08 NOTE — Progress Notes (Signed)
Appt canceled!

## 2023-01-08 NOTE — Progress Notes (Unsigned)
Alert received through NCNotify system as follows:    Alert Note: Patient identified as having high BP reading (143/90) on 12/26/2022  Chart review: No blood pressure follow up documented.  Pt No Showed on 12/31/22 and 01/02/23, Canceled appt on 01/07/23, next appt is scheduled for 01/21/23.  Action needed: Will check at next appt.  Julia Roman, [place] 01/08/2023  3:08 PM

## 2023-01-12 ENCOUNTER — Encounter: Payer: Self-pay | Admitting: Family Medicine

## 2023-01-19 NOTE — Progress Notes (Signed)
Alert received through NCNotify system as follows: Patient identified as having high BP reading (143/90) on 12/26/2022 with no recent follow-up.  Chart review: No blood pressure follow up documented.  Action needed: Will recheck BP at next appointment on 01/21/2023.   Martina Sinner, RN 01/19/2023  10:51 AM

## 2023-01-21 ENCOUNTER — Other Ambulatory Visit: Payer: Self-pay

## 2023-01-21 ENCOUNTER — Ambulatory Visit (INDEPENDENT_AMBULATORY_CARE_PROVIDER_SITE_OTHER): Payer: Medicaid Other | Admitting: Certified Nurse Midwife

## 2023-01-21 ENCOUNTER — Other Ambulatory Visit (HOSPITAL_COMMUNITY)
Admission: RE | Admit: 2023-01-21 | Discharge: 2023-01-21 | Disposition: A | Discharge status: Home / Self Care | Payer: Medicaid Other | Source: Ambulatory Visit | Attending: Medical | Admitting: Medical

## 2023-01-21 VITALS — BP 119/75 | HR 78 | Wt 118.0 lb

## 2023-01-21 DIAGNOSIS — B9689 Other specified bacterial agents as the cause of diseases classified elsewhere: Secondary | ICD-10-CM

## 2023-01-21 DIAGNOSIS — N898 Other specified noninflammatory disorders of vagina: Secondary | ICD-10-CM | POA: Diagnosis present

## 2023-01-21 DIAGNOSIS — A599 Trichomoniasis, unspecified: Secondary | ICD-10-CM

## 2023-01-21 DIAGNOSIS — Z3A22 22 weeks gestation of pregnancy: Secondary | ICD-10-CM

## 2023-01-21 DIAGNOSIS — N760 Acute vaginitis: Secondary | ICD-10-CM

## 2023-01-21 DIAGNOSIS — O3432 Maternal care for cervical incompetence, second trimester: Secondary | ICD-10-CM

## 2023-01-21 DIAGNOSIS — B3731 Acute candidiasis of vulva and vagina: Secondary | ICD-10-CM

## 2023-01-21 DIAGNOSIS — O0992 Supervision of high risk pregnancy, unspecified, second trimester: Secondary | ICD-10-CM

## 2023-01-21 DIAGNOSIS — Z3A23 23 weeks gestation of pregnancy: Secondary | ICD-10-CM

## 2023-01-21 DIAGNOSIS — O0990 Supervision of high risk pregnancy, unspecified, unspecified trimester: Secondary | ICD-10-CM

## 2023-01-22 LAB — CERVICOVAGINAL ANCILLARY ONLY
Bacterial Vaginitis (gardnerella): POSITIVE — AB
Candida Glabrata: NEGATIVE
Candida Vaginitis: POSITIVE — AB
Comment: NEGATIVE
Comment: NEGATIVE
Comment: NEGATIVE
Comment: NEGATIVE
Trichomonas: POSITIVE — AB

## 2023-01-23 ENCOUNTER — Other Ambulatory Visit: Payer: Self-pay | Admitting: *Deleted

## 2023-01-23 ENCOUNTER — Ambulatory Visit: Payer: Medicaid Other | Attending: Obstetrics and Gynecology

## 2023-01-23 ENCOUNTER — Ambulatory Visit: Payer: Medicaid Other | Admitting: *Deleted

## 2023-01-23 VITALS — BP 104/67 | HR 58

## 2023-01-23 DIAGNOSIS — O09899 Supervision of other high risk pregnancies, unspecified trimester: Secondary | ICD-10-CM

## 2023-01-23 DIAGNOSIS — D573 Sickle-cell trait: Secondary | ICD-10-CM

## 2023-01-23 DIAGNOSIS — O3432 Maternal care for cervical incompetence, second trimester: Secondary | ICD-10-CM | POA: Diagnosis present

## 2023-01-23 DIAGNOSIS — O0990 Supervision of high risk pregnancy, unspecified, unspecified trimester: Secondary | ICD-10-CM | POA: Diagnosis present

## 2023-01-23 DIAGNOSIS — Z3A23 23 weeks gestation of pregnancy: Secondary | ICD-10-CM | POA: Diagnosis not present

## 2023-01-23 DIAGNOSIS — O09212 Supervision of pregnancy with history of pre-term labor, second trimester: Secondary | ICD-10-CM | POA: Diagnosis not present

## 2023-01-23 DIAGNOSIS — O3430 Maternal care for cervical incompetence, unspecified trimester: Secondary | ICD-10-CM | POA: Insufficient documentation

## 2023-01-24 ENCOUNTER — Encounter: Payer: Self-pay | Admitting: Certified Nurse Midwife

## 2023-01-25 MED ORDER — METRONIDAZOLE 500 MG PO TABS: 500.0000 mg | ORAL_TABLET | Freq: Two times a day (BID) | ORAL | 0 refills | Status: DC

## 2023-01-25 MED ORDER — FLUCONAZOLE 150 MG PO TABS: 150.0000 mg | ORAL_TABLET | Freq: Every day | ORAL | 1 refills | Status: DC

## 2023-01-25 NOTE — Progress Notes (Signed)
   PRENATAL VISIT NOTE  Subjective:  Julia Roman is a 27 y.o. [phone] at 5w2dbeing seen today for ongoing prenatal care.  She is currently monitored for the following issues for this high-risk pregnancy and has History of depression; Hemoglobin C trait ([place]; History of preterm delivery; Sickle cell trait ([place]; Recurrent pregnancy loss; Supervision of high risk pregnancy, antepartum; Cervical insufficiency in pregnancy, antepartum; and Cervical cerclage suture present, placed 11/24/22 on their problem list.  Patient reports no complaints.  Contractions: Not present. Vag. Bleeding: None.  Movement: Present. Denies leaking of fluid.   The following portions of the patient's history were reviewed and updated as appropriate: allergies, current medications, past family history, past medical history, past social history, past surgical history and problem list.   Objective:   Vitals:   01/21/23 1605  BP: 119/75  Pulse: 78  Weight: 118 lb (53.5 kg)    Fetal Status: Fetal Heart Rate (bpm): 152   Movement: Present     General:  Alert, oriented and cooperative. Patient is in no acute distress.  Skin: Skin is warm and dry. No rash noted.   Cardiovascular: Normal heart rate noted  Respiratory: Normal respiratory effort, no problems with respiration noted  Abdomen: Soft, gravid, appropriate for gestational age.  Pain/Pressure: Absent     Pelvic: Cervical exam deferred        Extremities: Normal range of motion.  Edema: None  Mental Status: Normal mood and affect. Normal behavior. Normal judgment and thought content.   Assessment and Plan:  Pregnancy: [id number] 267w2d. Supervision of high risk pregnancy in second trimester - Doing well, feeling regular and vigorous fetal movement   2. [redacted] weeks gestation of pregnancy - Routine OB care   3. Cervical cerclage suture present in second trimester - Denies cramping/bleeding  5. Vaginal irritation - Cervicovaginal ancillary only( [facility])  Preterm labor symptoms and general obstetric precautions including but not limited to vaginal bleeding, contractions, leaking of fluid and fetal movement were reviewed in detail with the patient. Please refer to After Visit Summary for other counseling recommendations.   Return in about 3 weeks (around 02/11/2023) for IN-PERSON, HOB/GTT.  Future Appointments  Date Time Provider DeAceitunas3/13/2024  2:15 PM AjDarliss CheneyMD [facility]4/03/2023  3:30 PM [facility]4/03/2023  3:45 PM [facility] US4 [facility]  JaGabriel CarinaCNM

## 2023-01-25 NOTE — Addendum Note (Signed)
Addended by: Gaylan Gerold on: 01/25/2023 05:11 PM   Modules accepted: Orders

## 2023-02-18 ENCOUNTER — Encounter: Payer: Medicaid Other | Admitting: Obstetrics and Gynecology

## 2023-02-23 ENCOUNTER — Other Ambulatory Visit: Payer: Self-pay

## 2023-02-23 ENCOUNTER — Ambulatory Visit (INDEPENDENT_AMBULATORY_CARE_PROVIDER_SITE_OTHER): Payer: Medicaid Other | Admitting: Family Medicine

## 2023-02-23 VITALS — BP 120/76 | HR 71 | Wt 118.4 lb

## 2023-02-23 DIAGNOSIS — O0992 Supervision of high risk pregnancy, unspecified, second trimester: Secondary | ICD-10-CM

## 2023-02-23 DIAGNOSIS — Z23 Encounter for immunization: Secondary | ICD-10-CM

## 2023-02-23 DIAGNOSIS — O0990 Supervision of high risk pregnancy, unspecified, unspecified trimester: Secondary | ICD-10-CM

## 2023-02-23 DIAGNOSIS — Z3A27 27 weeks gestation of pregnancy: Secondary | ICD-10-CM | POA: Diagnosis not present

## 2023-02-23 DIAGNOSIS — Z8751 Personal history of pre-term labor: Secondary | ICD-10-CM

## 2023-02-23 NOTE — Progress Notes (Signed)
   PRENATAL VISIT NOTE  Subjective:  Julia Roman is a 27 y.o. [phone] at [redacted]w[redacted]d being seen today for ongoing prenatal care.  She is currently monitored for the following issues for this low-risk pregnancy and has History of depression; Hemoglobin C trait ([place]); History of preterm delivery; Sickle cell trait ([place]); Recurrent pregnancy loss; Supervision of high risk pregnancy, antepartum; Cervical insufficiency in pregnancy, antepartum; and Cervical cerclage suture present, placed 11/24/22 on their problem list.  Patient reports no complaints.  Contractions: Not present. Vag. Bleeding: None.  Movement: Present. Denies leaking of fluid.   The following portions of the patient's history were reviewed and updated as appropriate: allergies, current medications, past family history, past medical history, past social history, past surgical history and problem list.   Objective:   Vitals:   02/23/23 1114  BP: 120/76  Pulse: 71  Weight: 118 lb 6.4 oz (53.7 kg)    Fetal Status: Fetal Heart Rate (bpm): 150   Movement: Present     General:  Alert, oriented and cooperative. Patient is in no acute distress.  Skin: Skin is warm and dry. No rash noted.   Cardiovascular: Normal heart rate noted  Respiratory: Normal respiratory effort, no problems with respiration noted  Abdomen: Soft, gravid, appropriate for gestational age.  Pain/Pressure: Absent     Pelvic: Cervical exam deferred        Extremities: Normal range of motion.     Mental Status: Normal mood and affect. Normal behavior. Normal judgment and thought content.   Assessment and Plan:  Pregnancy: [id number] at [redacted]w[redacted]d 1. Supervision of high risk pregnancy, antepartum Continue routine prenatal care. Needs 28 wk labs  2. History of preterm delivery Normal cervical length Has cerclage in place.  Preterm labor symptoms and general obstetric precautions including but not limited to vaginal bleeding, contractions, leaking of fluid and fetal  movement were reviewed in detail with the patient. Please refer to After Visit Summary for other counseling recommendations.   Return in 2 weeks (on 03/09/2023) for [place], 28 wk labs.  Future Appointments  Date Time Provider [place]  03/12/2023  3:30 PM [facility] NURSE [place] [facility]  03/12/2023  3:45 PM [facility] US4 [facility]    Donnamae Jude, MD

## 2023-03-12 ENCOUNTER — Ambulatory Visit: Payer: Medicaid Other | Admitting: *Deleted

## 2023-03-12 ENCOUNTER — Ambulatory Visit: Payer: Medicaid Other | Attending: Obstetrics

## 2023-03-12 VITALS — BP 119/75 | HR 87

## 2023-03-12 DIAGNOSIS — O09213 Supervision of pregnancy with history of pre-term labor, third trimester: Secondary | ICD-10-CM

## 2023-03-12 DIAGNOSIS — Z3A29 29 weeks gestation of pregnancy: Secondary | ICD-10-CM

## 2023-03-12 DIAGNOSIS — D563 Thalassemia minor: Secondary | ICD-10-CM

## 2023-03-12 DIAGNOSIS — O3432 Maternal care for cervical incompetence, second trimester: Secondary | ICD-10-CM | POA: Insufficient documentation

## 2023-03-12 DIAGNOSIS — O09899 Supervision of other high risk pregnancies, unspecified trimester: Secondary | ICD-10-CM | POA: Insufficient documentation

## 2023-03-12 DIAGNOSIS — O0990 Supervision of high risk pregnancy, unspecified, unspecified trimester: Secondary | ICD-10-CM | POA: Diagnosis present

## 2023-03-12 DIAGNOSIS — O3433 Maternal care for cervical incompetence, third trimester: Secondary | ICD-10-CM

## 2023-03-31 ENCOUNTER — Other Ambulatory Visit: Payer: Self-pay

## 2023-03-31 ENCOUNTER — Ambulatory Visit (INDEPENDENT_AMBULATORY_CARE_PROVIDER_SITE_OTHER): Payer: Medicaid Other | Admitting: Family Medicine

## 2023-03-31 VITALS — BP 111/69 | HR 75 | Wt 122.6 lb

## 2023-03-31 DIAGNOSIS — O0992 Supervision of high risk pregnancy, unspecified, second trimester: Secondary | ICD-10-CM

## 2023-03-31 DIAGNOSIS — Z8751 Personal history of pre-term labor: Secondary | ICD-10-CM

## 2023-03-31 DIAGNOSIS — O3430 Maternal care for cervical incompetence, unspecified trimester: Secondary | ICD-10-CM

## 2023-03-31 DIAGNOSIS — O0990 Supervision of high risk pregnancy, unspecified, unspecified trimester: Secondary | ICD-10-CM

## 2023-03-31 DIAGNOSIS — O3432 Maternal care for cervical incompetence, second trimester: Secondary | ICD-10-CM

## 2023-03-31 DIAGNOSIS — Z3A32 32 weeks gestation of pregnancy: Secondary | ICD-10-CM

## 2023-03-31 NOTE — Progress Notes (Signed)
   PRENATAL VISIT NOTE  Subjective:  Julia Roman is a 27 y.o. [phone] at [redacted]w[redacted]d being seen today for ongoing prenatal care.  She is currently monitored for the following issues for this high-risk pregnancy and has History of depression; Hemoglobin C trait; History of preterm delivery; Sickle cell trait; Recurrent pregnancy loss; Supervision of high risk pregnancy, antepartum; Cervical insufficiency in pregnancy, antepartum; and Cervical cerclage suture present, placed 11/24/22 on their problem list.  Patient reports  fell today onto a chair in her crotch .  Contractions: Irritability. Vag. Bleeding: None.  Movement: Present. Denies leaking of fluid.   The following portions of the patient's history were reviewed and updated as appropriate: allergies, current medications, past family history, past medical history, past social history, past surgical history and problem list.   Objective:   Vitals:   03/31/23 1622  BP: 111/69  Pulse: 75  Weight: 122 lb 9.6 oz (55.6 kg)    Fetal Status: Fetal Heart Rate (bpm): 170 Fundal Height: 32 cm Movement: Present     General:  Alert, oriented and cooperative. Patient is in no acute distress.  Skin: Skin is warm and dry. No rash noted.   Cardiovascular: Normal heart rate noted  Respiratory: Normal respiratory effort, no problems with respiration noted  Abdomen: Soft, gravid, appropriate for gestational age.  Pain/Pressure: Present     Pelvic: Cervical exam deferred        Extremities: Normal range of motion.  Edema: Trace  Mental Status: Normal mood and affect. Normal behavior. Normal judgment and thought content.   Assessment and Plan:  Pregnancy: [id number] at [redacted]w[redacted]d 1. Cervical insufficiency in pregnancy, antepartum S/p cerclage  2. Supervision of high risk pregnancy, antepartum Needs 28 week labs  3. History of preterm delivery No s/sx's of PTL  Preterm labor symptoms and general obstetric precautions including but not limited to vaginal  bleeding, contractions, leaking of fluid and fetal movement were reviewed in detail with the patient. Please refer to After Visit Summary for other counseling recommendations.   Return in 2 weeks (on 04/14/2023) for [place], 28 wk labs ASAP.  Future Appointments  Date Time Provider Department Center  04/03/2023  8:50 AM [facility]  04/14/2023  3:15 PM Reva Bores, MD [facility]    Reva Bores, MD

## 2023-04-03 ENCOUNTER — Other Ambulatory Visit: Payer: Medicaid Other

## 2023-04-10 ENCOUNTER — Encounter (HOSPITAL_COMMUNITY): Payer: Self-pay | Admitting: Obstetrics & Gynecology

## 2023-04-10 ENCOUNTER — Inpatient Hospital Stay (HOSPITAL_COMMUNITY)
Admission: AD | Admit: 2023-04-10 | Discharge: 2023-04-10 | Disposition: A | Discharge status: Home / Self Care | Payer: Medicaid Other | Attending: Obstetrics & Gynecology | Admitting: Obstetrics & Gynecology

## 2023-04-10 ENCOUNTER — Encounter: Payer: Self-pay | Admitting: Family Medicine

## 2023-04-10 DIAGNOSIS — O99333 Smoking (tobacco) complicating pregnancy, third trimester: Secondary | ICD-10-CM | POA: Insufficient documentation

## 2023-04-10 DIAGNOSIS — A084 Viral intestinal infection, unspecified: Secondary | ICD-10-CM | POA: Diagnosis not present

## 2023-04-10 DIAGNOSIS — F17210 Nicotine dependence, cigarettes, uncomplicated: Secondary | ICD-10-CM | POA: Insufficient documentation

## 2023-04-10 DIAGNOSIS — Z3A34 34 weeks gestation of pregnancy: Secondary | ICD-10-CM | POA: Insufficient documentation

## 2023-04-10 DIAGNOSIS — O218 Other vomiting complicating pregnancy: Secondary | ICD-10-CM | POA: Diagnosis not present

## 2023-04-10 DIAGNOSIS — O99013 Anemia complicating pregnancy, third trimester: Secondary | ICD-10-CM | POA: Diagnosis not present

## 2023-04-10 DIAGNOSIS — D573 Sickle-cell trait: Secondary | ICD-10-CM | POA: Insufficient documentation

## 2023-04-10 DIAGNOSIS — O26893 Other specified pregnancy related conditions, third trimester: Secondary | ICD-10-CM | POA: Diagnosis not present

## 2023-04-10 LAB — WET PREP, GENITAL
Clue Cells Wet Prep HPF POC: NONE SEEN
Sperm: NONE SEEN
Trich, Wet Prep: NONE SEEN
WBC, Wet Prep HPF POC: <10 (ref <10)
Yeast Wet Prep HPF POC: NONE SEEN

## 2023-04-10 LAB — URINALYSIS, ROUTINE W REFLEX MICROSCOPIC
Bilirubin Urine: NEGATIVE
Glucose, UA: NEGATIVE mg/dL
Hgb urine dipstick: NEGATIVE
Ketones, ur: NEGATIVE mg/dL
Leukocytes,Ua: NEGATIVE
Nitrite: NEGATIVE
Protein, ur: NEGATIVE mg/dL
Specific Gravity, Urine: 1.011 (ref 1.005–1.030)
pH: 7 (ref 5.0–8.0)

## 2023-04-10 NOTE — MAU Note (Signed)
Julia Roman is a 27 y.o. at [redacted]w[redacted]d here in MAU reporting: since last night stomach keeps tightening and loosening. ?Deberah Pelton.  Back has really been hurting. Has been really nauseous, threw up once last night. Has hx of PTL?D and has a cerclage in. Has had diarrhea- twice  No bleeding or leaking, reports baby is moving a little less  Onset of complaint: yesterday Pain score: 7/back 6 Vitals:   04/10/23 1029  BP: 123/71  Pulse: 72  Resp: 17  Temp: 98 F (36.7 C)  SpO2: 99%     FHT:134 Lab orders placed from triage:  urine

## 2023-04-10 NOTE — MAU Provider Note (Signed)
History     CSN: [id number]  Arrival date and time: 04/10/23 1016   Event Date/Time   First Provider Initiated Contact with Patient 04/10/23 1052      Chief Complaint  Patient presents with   Abdominal Pain   Back Pain   Nausea   Julia Roman is 27 y.o. [phone] who present today with nausea and vomiting. She states that yesterday she started with nausea and some diarrhea and then had vomiting last night. She has not vomited since last night. She has not taken anything for the nausea/vomiting. Now today she is having some cramping she thinks she may be having contractions.   Abdominal Pain  Back Pain Associated symptoms include abdominal pain.    OB History     Gravida  8   Para  3   Term  2   Preterm  1   AB  4   Living  3      SAB  2   IAB  2   Ectopic      Multiple      Live Births  3           Past Medical History:  Diagnosis Date   Anemia    Chlamydia 2010   Depression AGE 37   NO MEDS CURRENTLY   Gonorrhea 2010   Headache    Ovarian cyst 2013   Sickle cell trait (HCC)    Trichomonal vaginitis during pregnancy 01/19/2019   See on pap done at [facility] 01-2019   Trichomonas vaginalis infection 4/282015   Urinary tract infection    OCC    Past Surgical History:  Procedure Laterality Date   CERVICAL CERCLAGE N/A 11/24/2022   Procedure: CERCLAGE CERVICAL;  Surgeon: Tereso Newcomer, MD;  Location: [facility] LD [facility]S;  Service: Gynecology;  Laterality: N/A;   NO PAST SURGERIES      Family History  Problem Relation Age of Onset   Hypertension Mother    Asthma Mother    Fibromyalgia Mother    Sickle cell trait Father    Asthma Brother    Cancer Maternal Aunt        MOUTH   Hypertension Maternal Aunt    Seizures Maternal Aunt    COPD Maternal Aunt    Heart disease Maternal Grandmother    Hyperlipidemia Maternal Grandmother    Hypertension Maternal Grandmother    Arthritis Maternal Grandfather    Diabetes Maternal Grandfather     Hypertension Maternal Grandfather    Kidney disease Maternal Grandfather    Stroke Maternal Grandfather    Diabetes Paternal Grandmother    Birth defects Cousin        CLEFT LIP   Cancer Cousin     Social History   Tobacco Use   Smoking status: Every Day    Packs/day: .25    Types: Cigarettes   Smokeless tobacco: Never  Vaping Use   Vaping Use: Never used  Substance Use Topics   Alcohol use: Not Currently    Alcohol/week: 21.0 standard drinks of alcohol    Types: 21 Shots of liquor per week    Comment: 3 per day when not pregnant   Drug use: Yes    Frequency: 7.0 times per week    Types: Marijuana    Comment: daily    Allergies:  Allergies  Allergen Reactions   Tramadol Hives and Rash    Entire body rash    Medications Prior to Admission  Medication Sig Dispense  Refill Last Dose   acetaminophen (TYLENOL) 500 MG tablet Take 500-1,000 mg by mouth every 6 (six) hours as needed (pain.).   Past Month   docusate sodium (COLACE) 100 MG capsule Take 1 capsule (100 mg total) by mouth 2 (two) times daily as needed for mild constipation or moderate constipation. 30 capsule 2 Past Month   Prenatal Vit-Fe Fumarate-FA (PRENATAL PLUS VITAMIN/MINERAL) 27-1 MG TABS Take 1 tablet by mouth daily. 30 tablet 11 04/10/2023    Review of Systems  Gastrointestinal:  Positive for abdominal pain.  Musculoskeletal:  Positive for back pain.  All other systems reviewed and are negative.  Physical Exam   Blood pressure 123/71, pulse 72, temperature 98 F (36.7 C), temperature source Oral, resp. rate 17, height 5\' 1"  (1.549 m), weight 53.4 kg, last menstrual period 08/15/2022, SpO2 99 %.  Physical Exam Constitutional:      Appearance: She is well-developed.  HENT:     Head: Normocephalic.  Eyes:     Pupils: Pupils are equal, round, and reactive to light.  Cardiovascular:     Rate and Rhythm: Normal rate and regular rhythm.     Heart sounds: Normal heart sounds.  Pulmonary:     Effort:  Pulmonary effort is normal. No respiratory distress.     Breath sounds: Normal breath sounds.  Abdominal:     Palpations: Abdomen is soft.     Tenderness: There is no abdominal tenderness.  Genitourinary:    Vagina: No bleeding. Vaginal discharge: mucusy.    Comments: External: no lesion Vagina: small amount of white discharge Dilation: Closed Effacement (%): Thick Cervical Position: Posterior Station: Ballotable Cerclage sutures intact   Musculoskeletal:        General: Normal range of motion.     Cervical back: Normal range of motion and neck supple.  Skin:    General: Skin is warm and dry.  Neurological:     Mental Status: She is alert and oriented to person, place, and time.  Psychiatric:        Mood and Affect: Mood normal.        Behavior: Behavior normal.   Results for orders placed or performed during the hospital encounter of 04/10/23 (from the past 24 hour(s))  Urinalysis, Routine w reflex microscopic -Urine, Clean Catch     Status: None   Collection Time: 04/10/23 10:39 AM  Result Value Ref Range   Color, Urine YELLOW YELLOW   APPearance CLEAR CLEAR   Specific Gravity, Urine 1.011 1.005 - 1.030   pH 7.0 5.0 - 8.0   Glucose, UA NEGATIVE NEGATIVE mg/dL   Hgb urine dipstick NEGATIVE NEGATIVE   Bilirubin Urine NEGATIVE NEGATIVE   Ketones, ur NEGATIVE NEGATIVE mg/dL   Protein, ur NEGATIVE NEGATIVE mg/dL   Nitrite NEGATIVE NEGATIVE   Leukocytes,Ua NEGATIVE NEGATIVE  Wet prep, genital     Status: None   Collection Time: 04/10/23 11:01 AM  Result Value Ref Range   Yeast Wet Prep HPF POC NONE SEEN NONE SEEN   Trich, Wet Prep NONE SEEN NONE SEEN   Clue Cells Wet Prep HPF POC NONE SEEN NONE SEEN   WBC, Wet Prep HPF POC <10 <10   Sperm NONE SEEN     NST:  Baseline: 140 Variability: moderate Accels: 15x15 Decels: none Toco: some UI noted when first placed on the monitor  Reactive/Appropriate for GA  Results for orders placed or performed during the hospital  encounter of 04/10/23 (from the past 24 hour(s))  Urinalysis, Routine  w reflex microscopic -Urine, Clean Catch     Status: None   Collection Time: 04/10/23 10:39 AM  Result Value Ref Range   Color, Urine YELLOW YELLOW   APPearance CLEAR CLEAR   Specific Gravity, Urine 1.011 1.005 - 1.030   pH 7.0 5.0 - 8.0   Glucose, UA NEGATIVE NEGATIVE mg/dL   Hgb urine dipstick NEGATIVE NEGATIVE   Bilirubin Urine NEGATIVE NEGATIVE   Ketones, ur NEGATIVE NEGATIVE mg/dL   Protein, ur NEGATIVE NEGATIVE mg/dL   Nitrite NEGATIVE NEGATIVE   Leukocytes,Ua NEGATIVE NEGATIVE  Wet prep, genital     Status: None   Collection Time: 04/10/23 11:01 AM  Result Value Ref Range   Yeast Wet Prep HPF POC NONE SEEN NONE SEEN   Trich, Wet Prep NONE SEEN NONE SEEN   Clue Cells Wet Prep HPF POC NONE SEEN NONE SEEN   WBC, Wet Prep HPF POC <10 <10   Sperm NONE SEEN      MAU Course  Procedures  MDM Patient has had 1L of IV fluids and UI has stopped now. Patient reports feeling better and she is tolerating PO at this time.   Assessment and Plan   1. Viral gastroenteritis   2. [redacted] weeks gestation of pregnancy    DC home in stable condition  3rd Trimester precautions  PTL precautions  Fetal kick counts RX: None Return to MAU as needed FU with OB as planned   Follow-up Information     [facility] for [facility] Follow up.   Specialty: Obstetrics and Gynecology Why: As scheduled Contact information: [address] [place] [address] [phone]               Thressa Sheller DNP, CNM  04/10/23  7:50 PM

## 2023-04-13 LAB — GC/CHLAMYDIA PROBE AMP (~~LOC~~) NOT AT ARMC
Chlamydia: NEGATIVE
Comment: NEGATIVE
Comment: NORMAL
Neisseria Gonorrhea: NEGATIVE

## 2023-04-14 ENCOUNTER — Ambulatory Visit: Payer: Medicaid Other | Admitting: Family Medicine

## 2023-04-14 ENCOUNTER — Other Ambulatory Visit: Payer: Self-pay

## 2023-04-14 VITALS — BP 120/79 | HR 87 | Wt 120.8 lb

## 2023-04-14 DIAGNOSIS — O0993 Supervision of high risk pregnancy, unspecified, third trimester: Secondary | ICD-10-CM

## 2023-04-14 DIAGNOSIS — O3433 Maternal care for cervical incompetence, third trimester: Secondary | ICD-10-CM

## 2023-04-14 DIAGNOSIS — O3430 Maternal care for cervical incompetence, unspecified trimester: Secondary | ICD-10-CM

## 2023-04-14 DIAGNOSIS — O0990 Supervision of high risk pregnancy, unspecified, unspecified trimester: Secondary | ICD-10-CM

## 2023-04-14 DIAGNOSIS — Z3A34 34 weeks gestation of pregnancy: Secondary | ICD-10-CM

## 2023-04-14 NOTE — Progress Notes (Signed)
   PRENATAL VISIT NOTE  Subjective:  Julia Roman is a 27 y.o. [phone] at [redacted]w[redacted]d being seen today for ongoing prenatal care.  She is currently monitored for the following issues for this high-risk pregnancy and has History of depression; Hemoglobin C trait (HCC); History of preterm delivery; Sickle cell trait (HCC); Recurrent pregnancy loss; Supervision of high risk pregnancy, antepartum; Cervical insufficiency in pregnancy, antepartum; and Cervical cerclage suture present, placed 11/24/22 on their problem list.  Patient reports no complaints.  Contractions: Not present. Vag. Bleeding: None.  Movement: Present. Denies leaking of fluid.   The following portions of the patient's history were reviewed and updated as appropriate: allergies, current medications, past family history, past medical history, past social history, past surgical history and problem list.   Objective:   Vitals:   04/14/23 1543  BP: 120/79  Pulse: 87  Weight: 120 lb 12.8 oz (54.8 kg)    Fetal Status: Fetal Heart Rate (bpm): 143 Fundal Height: 33 cm Movement: Present  Presentation: Vertex  General:  Alert, oriented and cooperative. Patient is in no acute distress.  Skin: Skin is warm and dry. No rash noted.   Cardiovascular: Normal heart rate noted  Respiratory: Normal respiratory effort, no problems with respiration noted  Abdomen: Soft, gravid, appropriate for gestational age.  Pain/Pressure: Present     Pelvic: Cervical exam deferred        Extremities: Normal range of motion.  Edema: None  Mental Status: Normal mood and affect. Normal behavior. Normal judgment and thought content.   Assessment and Plan:  Pregnancy: [id number] at [redacted]w[redacted]d 1. Supervision of high risk pregnancy, antepartum 28 week labs Still needs glucose challenge - CBC - RPR - HIV Antibody (routine testing w rflx)  2. Cervical insufficiency in pregnancy, antepartum S/p Cerclage for removal @ 37 weeks  Preterm labor symptoms and general  obstetric precautions including but not limited to vaginal bleeding, contractions, leaking of fluid and fetal movement were reviewed in detail with the patient. Please refer to After Visit Summary for other counseling recommendations.   Return in 2 weeks (on 04/28/2023) for 28 wk labs early a appointment.  No future appointments.  Reva Bores, MD

## 2023-04-15 LAB — CBC
Hematocrit: 33.7 % — ABNORMAL LOW (ref 34.0–46.6)
Hemoglobin: 11.7 g/dL (ref 11.1–15.9)
MCH: 28 pg (ref 26.6–33.0)
MCHC: 34.7 g/dL (ref 31.5–35.7)
MCV: 81 fL (ref 79–97)
Platelets: 266 10*3/uL (ref 150–450)
RBC: 4.18 x10E6/uL (ref 3.77–5.28)
RDW: 14 % (ref 11.7–15.4)
WBC: 9.1 10*3/uL (ref 3.4–10.8)

## 2023-04-15 LAB — RPR: RPR Ser Ql: NONREACTIVE

## 2023-04-15 LAB — HIV ANTIBODY (ROUTINE TESTING W REFLEX): HIV Screen 4th Generation wRfx: NONREACTIVE

## 2023-04-27 ENCOUNTER — Other Ambulatory Visit: Payer: Self-pay | Admitting: General Practice

## 2023-04-27 DIAGNOSIS — O0990 Supervision of high risk pregnancy, unspecified, unspecified trimester: Secondary | ICD-10-CM

## 2023-04-27 NOTE — Progress Notes (Addendum)
   PRENATAL VISIT NOTE  Subjective:  Julia Roman is a 27 y.o. [phone] at [redacted]w[redacted]d being seen today for ongoing prenatal care.  She is currently monitored for the following issues for this high-risk pregnancy and has History of depression; Hemoglobin C trait (HCC); History of preterm delivery; Sickle cell trait (HCC); Recurrent pregnancy loss; Supervision of high risk pregnancy, antepartum; Cervical insufficiency in pregnancy, antepartum; and Cervical cerclage suture present, placed 11/24/22 on their problem list.  Patient reports {sx:14538}. ***   .  .   . Denies leaking of fluid.   The following portions of the patient's history were reviewed and updated as appropriate: allergies, current medications, past family history, past medical history, past social history, past surgical history and problem list.   Objective:  There were no vitals filed for this visit.  Fetal Status:           General:  Alert, oriented and cooperative. Patient is in no acute distress.  Skin: Skin is warm and dry. No rash noted.   Cardiovascular: Normal heart rate noted  Respiratory: Normal respiratory effort, no problems with respiration noted  Abdomen: Soft, gravid, appropriate for gestational age.        Pelvic: {Blank single:19197::"Cervical exam performed in the presence of a chaperone","Cervical exam deferred"}        Extremities: Normal range of motion.     Mental Status: Normal mood and affect. Normal behavior. Normal judgment and thought content.   Assessment and Plan:  Pregnancy: [id number] at [redacted]w[redacted]d  1. Supervision of high risk pregnancy, antepartum  2. Hemoglobin C trait (HCC)   Glucola GBS ***  Nursing Staff Provider  Office Location [facility] Dating  05/22/2023, LMP c/w 11 wk [place]  [facility] Model [ X] Traditional [ ]  Centering [ ]  Mom-Baby Dyad Anatomy US  normal  Language  English    Flu Vaccine  declined Genetic/Carrier Screen  NIPS:   low risk AFP:   neg [place]:  TDaP Vaccine    02/23/23 Hgb A1C or  GTT Early - normal Third trimester -   COVID Vaccine none   LAB RESULTS   Rhogam  N/a Blood Type AB/Positive/-- (11/29 1557)   Baby Feeding Plan Both Antibody Negative (11/29 1557)  Contraception BTL v IUD Rubella 1.96 (11/29 1557)  Circumcision Yes - BOY RPR Non Reactive (05/07 1608)   Pediatrician  [facility] HBsAg Negative (11/29 1557)   Support Person Company secretary (mom) HCVAb Non Reactive (11/29 1557)   Prenatal Classes  HIV Non Reactive (05/07 1608)     BTL Consent ? GBS   (For PCN allergy, check sensitivities)   VBAC Consent NA Pap Diagnosis  Date Value Ref Range Status  01/17/2019   Final   NEGATIVE FOR INTRAEPITHELIAL LESIONS OR MALIGNANCY.  01/17/2019 TRICHOMONAS VAGINALIS PRESENT.  Final      Preterm labor symptoms and general obstetric precautions including but not limited to vaginal bleeding, contractions, leaking of fluid and fetal movement were reviewed in detail with the patient. Please refer to After Visit Summary for other counseling recommendations.   No follow-ups on file.  Future Appointments  Date Time Provider Department Center  04/28/2023  8:50 AM [facility]  04/28/2023  9:35 AM Mercado-Ortiz, Lahoma Crocker, DO [facility]    Myrtie Hawk, DO FMOB Fellow, Faculty practice [facility], [facility] 04/27/23  4:15 PM

## 2023-04-28 ENCOUNTER — Other Ambulatory Visit: Payer: Self-pay

## 2023-04-28 ENCOUNTER — Ambulatory Visit (INDEPENDENT_AMBULATORY_CARE_PROVIDER_SITE_OTHER): Payer: Medicaid Other | Admitting: Family Medicine

## 2023-04-28 ENCOUNTER — Other Ambulatory Visit: Payer: Medicaid Other

## 2023-04-28 VITALS — BP 120/76 | HR 63 | Wt 124.3 lb

## 2023-04-28 DIAGNOSIS — O0990 Supervision of high risk pregnancy, unspecified, unspecified trimester: Secondary | ICD-10-CM

## 2023-04-28 DIAGNOSIS — Z3A36 36 weeks gestation of pregnancy: Secondary | ICD-10-CM

## 2023-04-28 DIAGNOSIS — D582 Other hemoglobinopathies: Secondary | ICD-10-CM

## 2023-04-28 DIAGNOSIS — O3433 Maternal care for cervical incompetence, third trimester: Secondary | ICD-10-CM

## 2023-04-28 DIAGNOSIS — O0993 Supervision of high risk pregnancy, unspecified, third trimester: Secondary | ICD-10-CM

## 2023-04-28 NOTE — Addendum Note (Signed)
Addended by: Patrcia Dolly on: 04/28/2023 10:16 AM   Modules accepted: Orders

## 2023-04-29 LAB — GLUCOSE TOLERANCE, 2 HOURS W/ 1HR
Glucose, 1 hour: 165 mg/dL (ref 70–179)
Glucose, 2 hour: 131 mg/dL (ref 70–152)
Glucose, Fasting: 79 mg/dL (ref 70–91)

## 2023-05-07 ENCOUNTER — Ambulatory Visit (INDEPENDENT_AMBULATORY_CARE_PROVIDER_SITE_OTHER): Payer: Medicaid Other | Admitting: Obstetrics and Gynecology

## 2023-05-07 ENCOUNTER — Encounter: Payer: Medicaid Other | Admitting: Obstetrics and Gynecology

## 2023-05-07 ENCOUNTER — Other Ambulatory Visit (HOSPITAL_COMMUNITY)
Admission: RE | Admit: 2023-05-07 | Discharge: 2023-05-07 | Disposition: A | Discharge status: Home / Self Care | Payer: Medicaid Other | Source: Ambulatory Visit | Attending: Obstetrics and Gynecology | Admitting: Obstetrics and Gynecology

## 2023-05-07 VITALS — BP 139/83 | HR 78 | Wt 126.0 lb

## 2023-05-07 DIAGNOSIS — Z3A37 37 weeks gestation of pregnancy: Secondary | ICD-10-CM | POA: Diagnosis present

## 2023-05-07 DIAGNOSIS — O0993 Supervision of high risk pregnancy, unspecified, third trimester: Secondary | ICD-10-CM | POA: Diagnosis not present

## 2023-05-07 DIAGNOSIS — O3433 Maternal care for cervical incompetence, third trimester: Secondary | ICD-10-CM

## 2023-05-07 DIAGNOSIS — B3731 Acute candidiasis of vulva and vagina: Secondary | ICD-10-CM

## 2023-05-07 MED ORDER — FLUCONAZOLE 150 MG PO TABS: 150.0000 mg | ORAL_TABLET | Freq: Once | ORAL | 0 refills | Status: AC

## 2023-05-07 NOTE — Progress Notes (Signed)
   PRENATAL VISIT NOTE  Subjective:  Julia Roman is a 27 y.o. [phone] at [redacted]w[redacted]d being seen today for ongoing prenatal care.  She is currently monitored for the following issues for this high-risk pregnancy and has History of depression; Hemoglobin C trait (HCC); History of preterm delivery; Sickle cell trait (HCC); Recurrent pregnancy loss; Supervision of high risk pregnancy, antepartum; Cervical insufficiency in pregnancy, antepartum; and Cervical cerclage suture present, placed 11/24/22 on their problem list.  Patient reports no complaints.  Contractions: Irritability. Vag. Bleeding: None.  Movement: Present. Denies leaking of fluid.   The following portions of the patient's history were reviewed and updated as appropriate: allergies, current medications, past family history, past medical history, past social history, past surgical history and problem list.   Objective:   Vitals:   05/07/23 1515  BP: 139/83  Pulse: 78  Weight: 126 lb (57.2 kg)    Fetal Status: Fetal Heart Rate (bpm): 142 Fundal Height: 36 cm Movement: Present  Presentation: Vertex  General:  Alert, oriented and cooperative. Patient is in no acute distress.  Skin: Skin is warm and dry. No rash noted.   Cardiovascular: Normal heart rate noted  Respiratory: Normal respiratory effort, no problems with respiration noted  Abdomen: Soft, gravid, appropriate for gestational age.  Pain/Pressure: Present     Pelvic: Cervical exam performed in the presence of a chaperone Dilation: Closed    cerclage removed. Cervix visually closed  Extremities: Normal range of motion.  Edema: None  Mental Status: Normal mood and affect. Normal behavior. Normal judgment and thought content.   Assessment and Plan:  Pregnancy: [id number] at [redacted]w[redacted]d 1. [redacted] weeks gestation of pregnancy - Culture, beta strep (group b only) - Cytology - PAP( [place])  2. Vulvovaginal candidiasis Diflucan x 1 sent in  3. Cervical cerclage suture present in  third trimester Removed today  Term labor symptoms and general obstetric precautions including but not limited to vaginal bleeding, contractions, leaking of fluid and fetal movement were reviewed in detail with the patient. Please refer to After Visit Summary for other counseling recommendations.   Return in about 1 week (around 05/14/2023) for low risk ob, in person, md or app.  No future appointments.  Young Bing, MD

## 2023-05-11 ENCOUNTER — Encounter: Payer: Medicaid Other | Admitting: Obstetrics and Gynecology

## 2023-05-11 LAB — CULTURE, BETA STREP (GROUP B ONLY): Strep Gp B Culture: NEGATIVE

## 2023-05-12 LAB — CYTOLOGY - PAP
Chlamydia: NEGATIVE
Comment: NEGATIVE
Comment: NORMAL
Diagnosis: NEGATIVE
Neisseria Gonorrhea: NEGATIVE

## 2023-05-13 ENCOUNTER — Encounter: Payer: Medicaid Other | Admitting: Obstetrics and Gynecology

## 2023-05-13 ENCOUNTER — Encounter (HOSPITAL_COMMUNITY): Payer: Self-pay | Admitting: Obstetrics and Gynecology

## 2023-05-13 ENCOUNTER — Inpatient Hospital Stay (HOSPITAL_COMMUNITY)
Admission: AD | Admit: 2023-05-13 | Discharge: 2023-05-15 | DRG: 807 | Disposition: A | Discharge status: Home / Self Care | Payer: Medicaid Other | Attending: Obstetrics and Gynecology | Admitting: Obstetrics and Gynecology

## 2023-05-13 DIAGNOSIS — O3433 Maternal care for cervical incompetence, third trimester: Secondary | ICD-10-CM | POA: Diagnosis not present

## 2023-05-13 DIAGNOSIS — Z8659 Personal history of other mental and behavioral disorders: Secondary | ICD-10-CM

## 2023-05-13 DIAGNOSIS — O43123 Velamentous insertion of umbilical cord, third trimester: Secondary | ICD-10-CM | POA: Diagnosis present

## 2023-05-13 DIAGNOSIS — O99334 Smoking (tobacco) complicating childbirth: Secondary | ICD-10-CM | POA: Diagnosis present

## 2023-05-13 DIAGNOSIS — O479 False labor, unspecified: Secondary | ICD-10-CM

## 2023-05-13 DIAGNOSIS — F17210 Nicotine dependence, cigarettes, uncomplicated: Secondary | ICD-10-CM | POA: Diagnosis present

## 2023-05-13 DIAGNOSIS — Z3A38 38 weeks gestation of pregnancy: Secondary | ICD-10-CM

## 2023-05-13 DIAGNOSIS — O3430 Maternal care for cervical incompetence, unspecified trimester: Secondary | ICD-10-CM | POA: Diagnosis present

## 2023-05-13 DIAGNOSIS — O9902 Anemia complicating childbirth: Secondary | ICD-10-CM | POA: Diagnosis present

## 2023-05-13 DIAGNOSIS — O0990 Supervision of high risk pregnancy, unspecified, unspecified trimester: Secondary | ICD-10-CM

## 2023-05-13 DIAGNOSIS — D573 Sickle-cell trait: Secondary | ICD-10-CM | POA: Diagnosis present

## 2023-05-13 DIAGNOSIS — O26893 Other specified pregnancy related conditions, third trimester: Secondary | ICD-10-CM | POA: Diagnosis present

## 2023-05-13 DIAGNOSIS — O4423 Partial placenta previa NOS or without hemorrhage, third trimester: Secondary | ICD-10-CM | POA: Diagnosis not present

## 2023-05-13 DIAGNOSIS — Z5982 Transportation insecurity: Secondary | ICD-10-CM

## 2023-05-13 DIAGNOSIS — D582 Other hemoglobinopathies: Secondary | ICD-10-CM | POA: Diagnosis present

## 2023-05-13 LAB — RPR: RPR Ser Ql: NONREACTIVE

## 2023-05-13 LAB — CBC
HCT: 35.1 % — ABNORMAL LOW (ref 36.0–46.0)
Hemoglobin: 12.6 g/dL (ref 12.0–15.0)
MCH: 27.6 pg (ref 26.0–34.0)
MCHC: 35.9 g/dL (ref 30.0–36.0)
MCV: 76.8 fL — ABNORMAL LOW (ref 80.0–100.0)
Platelets: 269 10*3/uL (ref 150–400)
RBC: 4.57 MIL/uL (ref 3.87–5.11)
RDW: 13.8 % (ref 11.5–15.5)
WBC: 9.6 10*3/uL (ref 4.0–10.5)
nRBC: 0 % (ref 0.0–0.2)

## 2023-05-13 MED ORDER — ACETAMINOPHEN 325 MG PO TABS: 650.0000 mg | ORAL_TABLET | ORAL | Status: DC | PRN

## 2023-05-13 MED ORDER — LACTATED RINGERS IV SOLN: 500.0000 mL | INTRAVENOUS | Status: DC | PRN

## 2023-05-13 MED ORDER — COCONUT OIL OIL: 1.0000 | TOPICAL_OIL | Status: DC | PRN

## 2023-05-13 MED ORDER — SIMETHICONE 80 MG PO CHEW: 80.0000 mg | CHEWABLE_TABLET | ORAL | Status: DC | PRN

## 2023-05-13 MED ORDER — OXYTOCIN BOLUS FROM INFUSION
333.0000 mL | Freq: Once | INTRAVENOUS | Status: AC
  Administered 2023-05-13: 333 mL via INTRAVENOUS

## 2023-05-13 MED ORDER — WITCH HAZEL-GLYCERIN EX PADS: 1.0000 | MEDICATED_PAD | CUTANEOUS | Status: DC | PRN

## 2023-05-13 MED ORDER — SOD CITRATE-CITRIC ACID 500-334 MG/5ML PO SOLN: 30.0000 mL | ORAL | Status: DC | PRN

## 2023-05-13 MED ORDER — ONDANSETRON HCL 4 MG PO TABS: 4.0000 mg | ORAL_TABLET | ORAL | Status: DC | PRN

## 2023-05-13 MED ORDER — FLEET ENEMA 7-19 GM/118ML RE ENEM: 1.0000 | ENEMA | Freq: Every day | RECTAL | Status: DC | PRN

## 2023-05-13 MED ORDER — ONDANSETRON HCL 4 MG/2ML IJ SOLN: 4.0000 mg | Freq: Four times a day (QID) | INTRAMUSCULAR | Status: DC | PRN

## 2023-05-13 MED ORDER — OXYCODONE-ACETAMINOPHEN 5-325 MG PO TABS: 1.0000 | ORAL_TABLET | ORAL | Status: DC | PRN

## 2023-05-13 MED ORDER — DIPHENHYDRAMINE HCL 25 MG PO CAPS: 25.0000 mg | ORAL_CAPSULE | Freq: Four times a day (QID) | ORAL | Status: DC | PRN

## 2023-05-13 MED ORDER — PRENATAL MULTIVITAMIN CH
1.0000 | ORAL_TABLET | Freq: Every day | ORAL | Status: DC
  Administered 2023-05-14 – 2023-05-15 (×2): 1 via ORAL
  Filled 2023-05-13 (×2): qty 1

## 2023-05-13 MED ORDER — OXYTOCIN-SODIUM CHLORIDE 30-0.9 UT/500ML-% IV SOLN
2.5000 [IU]/h | INTRAVENOUS | Status: DC
  Administered 2023-05-13: 2.5 [IU]/h via INTRAVENOUS
  Filled 2023-05-13: qty 500

## 2023-05-13 MED ORDER — IBUPROFEN 600 MG PO TABS
600.0000 mg | ORAL_TABLET | Freq: Four times a day (QID) | ORAL | Status: DC
  Administered 2023-05-13 – 2023-05-15 (×9): 600 mg via ORAL
  Filled 2023-05-13 (×9): qty 1

## 2023-05-13 MED ORDER — LIDOCAINE HCL (PF) 1 % IJ SOLN: 30.0000 mL | INTRAMUSCULAR | Status: DC | PRN

## 2023-05-13 MED ORDER — MISOPROSTOL 200 MCG PO TABS
400.0000 ug | ORAL_TABLET | Freq: Once | ORAL | Status: AC
  Administered 2023-05-13: 400 ug via ORAL

## 2023-05-13 MED ORDER — TETANUS-DIPHTH-ACELL PERTUSSIS 5-2.5-18.5 LF-MCG/0.5 IM SUSY: 0.5000 mL | PREFILLED_SYRINGE | Freq: Once | INTRAMUSCULAR | Status: DC

## 2023-05-13 MED ORDER — SODIUM CHLORIDE 0.9% FLUSH: 3.0000 mL | INTRAVENOUS | Status: DC | PRN

## 2023-05-13 MED ORDER — FENTANYL CITRATE (PF) 100 MCG/2ML IJ SOLN
100.0000 ug | Freq: Once | INTRAMUSCULAR | Status: AC
  Administered 2023-05-13: 100 ug via INTRAVENOUS

## 2023-05-13 MED ORDER — OXYCODONE-ACETAMINOPHEN 5-325 MG PO TABS: 2.0000 | ORAL_TABLET | ORAL | Status: DC | PRN

## 2023-05-13 MED ORDER — DIBUCAINE (PERIANAL) 1 % EX OINT: 1.0000 | TOPICAL_OINTMENT | CUTANEOUS | Status: DC | PRN

## 2023-05-13 MED ORDER — BENZOCAINE-MENTHOL 20-0.5 % EX AERO: 1.0000 | INHALATION_SPRAY | CUTANEOUS | Status: DC | PRN

## 2023-05-13 MED ORDER — SENNOSIDES-DOCUSATE SODIUM 8.6-50 MG PO TABS: 2.0000 | ORAL_TABLET | Freq: Every day | ORAL | Status: DC

## 2023-05-13 MED ORDER — LACTATED RINGERS IV SOLN: INTRAVENOUS | Status: DC

## 2023-05-13 MED ORDER — ZOLPIDEM TARTRATE 5 MG PO TABS: 5.0000 mg | ORAL_TABLET | Freq: Every evening | ORAL | Status: DC | PRN

## 2023-05-13 MED ORDER — SODIUM CHLORIDE 0.9% FLUSH: 3.0000 mL | Freq: Two times a day (BID) | INTRAVENOUS | Status: DC

## 2023-05-13 MED ORDER — MEASLES, MUMPS & RUBELLA VAC IJ SOLR: 0.5000 mL | Freq: Once | INTRAMUSCULAR | Status: DC

## 2023-05-13 MED ORDER — ONDANSETRON HCL 4 MG/2ML IJ SOLN: 4.0000 mg | INTRAMUSCULAR | Status: DC | PRN

## 2023-05-13 MED ORDER — MISOPROSTOL 200 MCG PO TABS
ORAL_TABLET | ORAL | Status: AC
  Filled 2023-05-13: qty 2

## 2023-05-13 MED ORDER — ACETAMINOPHEN 325 MG PO TABS
650.0000 mg | ORAL_TABLET | ORAL | Status: DC | PRN
  Administered 2023-05-13 – 2023-05-15 (×5): 650 mg via ORAL
  Filled 2023-05-13 (×5): qty 2

## 2023-05-13 MED ORDER — BISACODYL 10 MG RE SUPP: 10.0000 mg | Freq: Every day | RECTAL | Status: DC | PRN

## 2023-05-13 MED ORDER — FENTANYL CITRATE (PF) 100 MCG/2ML IJ SOLN
50.0000 ug | INTRAMUSCULAR | Status: DC | PRN
  Filled 2023-05-13: qty 2

## 2023-05-13 MED ORDER — SODIUM CHLORIDE 0.9 % IV SOLN: 250.0000 mL | INTRAVENOUS | Status: DC | PRN

## 2023-05-13 NOTE — MAU Note (Signed)
Julia Roman is a 27 y.o. at [redacted]w[redacted]d here in MAU reporting: ctx for the past 45 minutes. Pt denies VB and LOF. +FM. This RN, Consulting civil engineer and another Charity fundraiser at bedside. CNM at bedside to check pt and verify vertex. Pt 6.5cm on exam. CNM cleared pt for safe transfer.   Onset of complaint: 45 min ago  Pain score: 8/10 Vitals:   05/13/23 0523  BP: 123/69  Pulse: 67     FHT:133 Lab orders placed from triage:

## 2023-05-13 NOTE — Discharge Summary (Signed)
Postpartum Discharge Summary     Patient Name: Julia Roman DOB: [birth year] MRN: [id number]  Date of admission: 05/13/2023 Delivery date:05/13/2023  Delivering provider: Shawna Clamp R  Date of discharge: 05/15/2023  Admitting diagnosis: Normal labor [O80, Z37.9] Intrauterine pregnancy: [redacted]w[redacted]d     Secondary diagnosis:  Principal Problem:   Normal labor Active Problems:   History of depression   Hemoglobin C trait (HCC)   Sickle cell trait (HCC)   Supervision of high risk pregnancy, antepartum   Cervical insufficiency in pregnancy, antepartum  Additional problems: none    Discharge diagnosis: Term Pregnancy Delivered                                              Post partum procedures: none Augmentation:  none Complications: None  Hospital course: Onset of Labor With Vaginal Delivery      27 y.o. yo [id number] at [redacted]w[redacted]d was admitted in Active Labor on 05/13/2023. Labor course was complicated by precipitous labor  Membrane Rupture Time/Date: 5:59 AM ,05/13/2023   Delivery Method:Vaginal, Spontaneous  Episiotomy: None  Lacerations:  Periurethral;1st degree  Patient had a postpartum course complicated by none.  She is ambulating, tolerating a regular diet, passing flatus, and urinating well. Patient is discharged home in stable condition on 05/15/23.  Newborn Data: Birth date:05/13/2023  Birth time:6:18 AM  Gender:Female  Living status:Living  Apgars:9 ,9  Weight:3000 g   Magnesium Sulfate received: No BMZ received: No Rhophylac:N/A MMR:N/A T-DaP:Given prenatally Flu: No Transfusion:No  Physical exam  Vitals:   05/14/23 0540 05/14/23 1446 05/14/23 2158 05/15/23 0559  BP: 117/73 105/70 121/83 118/75  Pulse: (!) 47 (!) 57 66 (!) 51  Resp: 17 17 18 16   Temp: 98 F (36.7 C) 98.4 F (36.9 C) 98.1 F (36.7 C) (!) 97.3 F (36.3 C)  TempSrc: Oral Oral Oral Oral  SpO2:  98% 100%    General: alert, cooperative, and no distress Lochia: appropriate Uterine Fundus:  firm Incision: N/A DVT Evaluation: No evidence of DVT seen on physical exam. Labs: Lab Results  Component Value Date   WBC 9.6 05/13/2023   HGB 12.6 05/13/2023   HCT 35.1 (L) 05/13/2023   MCV 76.8 (L) 05/13/2023   PLT 269 05/13/2023      Latest Ref Rng & Units 05/17/2022    2:00 PM  CMP  Glucose 70 - 99 mg/dL 92   BUN 6 - 20 mg/dL 9   Creatinine 1.47 - 8.29 mg/dL 5.62   Sodium 130 - 865 mmol/L 138   Potassium 3.5 - 5.1 mmol/L 3.5   Chloride 98 - 111 mmol/L 104   CO2 22 - 32 mmol/L 24   Calcium 8.9 - 10.3 mg/dL 9.8   Total Protein 6.5 - 8.1 g/dL 7.6   Total Bilirubin 0.3 - 1.2 mg/dL 0.7   Alkaline Phos 38 - 126 U/L 56   AST 15 - 41 U/L 13   ALT 0 - 44 U/L 7    Edinburgh Score:    05/14/2023    2:30 PM  Edinburgh Postnatal Depression Scale Screening Tool  I have been able to laugh and see the funny side of things. 0  I have looked forward with enjoyment to things. 0  I have blamed myself unnecessarily when things went wrong. 2  I have been anxious or worried for no good reason.  2  I have felt scared or panicky for no good reason. 1  Things have been getting on top of me. 1  I have been so unhappy that I have had difficulty sleeping. 1  I have felt sad or miserable. 1  I have been so unhappy that I have been crying. 1  The thought of harming myself has occurred to me. 0  Edinburgh Postnatal Depression Scale Total 9     After visit meds:  Allergies as of 05/15/2023       Reactions   Tramadol Hives, Rash   Entire body rash        Medication List     TAKE these medications    acetaminophen 500 MG tablet Commonly known as: TYLENOL Take 500-1,000 mg by mouth every 6 (six) hours as needed (pain.).   docusate sodium 100 MG capsule Commonly known as: COLACE Take 1 capsule (100 mg total) by mouth 2 (two) times daily as needed for mild constipation or moderate constipation.   ibuprofen 600 MG tablet Commonly known as: ADVIL Take 1 tablet (600 mg total) by mouth  every 6 (six) hours.   Prenatal Plus Vitamin/Mineral 27-1 MG Tabs Take 1 tablet by mouth daily.         Discharge home in stable condition Infant Feeding: Bottle and Breast Infant Disposition:home with mother Discharge instruction: per After Visit Summary and Postpartum booklet. Activity: Advance as tolerated. Pelvic rest for 6 weeks.  Diet: routine diet Future Appointments: Future Appointments  Date Time Provider Department Center  06/02/2023  9:45 AM [facility] HEALTH CLINICIAN [facility]  06/10/2023  1:35 PM Lorriane Shire, MD [facility]   Follow up Visit: Cheral Marker, CNM  P [facility] Admin Pool Please schedule this patient for PP visit in: 4-6wks High risk pregnancy complicated by: recurrent pregnancy loss, cerclage Delivery mode:  SVD Anticipated Birth Control:  Plans Interval BTL, needs to have consent signed PP Procedures needed: none Schedule Integrated BH visit: yes Provider: Any provider  05/15/2023 Brand Males, CNM

## 2023-05-13 NOTE — Lactation Note (Signed)
This note was copied from a baby's chart. Lactation Consultation Note  Patient Name: Julia Roman [id number] Date: 05/13/2023 Age:28 hours Reason for consult: Initial assessment;Early term 49-38.6wks Mom is BF/Formula feeding. Mom stated baby latched right after delivery. Mom hopes to BF this baby longer than her other 3 children for 1 week. Mom stated the baby has been sleepy and not even interested in the formula since has came to the room. Newborn feeding habits, behavior, STS, I&O reviewed. Encouraged to call for assistance as needed.   Maternal Data    Feeding    LATCH Score                    Lactation Tools Discussed/Used    Interventions    Discharge    Consult Status Consult Status: Follow-up Date: 05/14/23 Follow-up type: In-patient    Charyl Dancer 05/13/2023, 8:09 PM

## 2023-05-13 NOTE — H&P (Signed)
LABOR H&P Julia Roman is a 27 y.o. [phone] female at [redacted]w[redacted]d by LMP c/w U/S at 11 wks presenting in active labor.   Reports active fetal movement, contractions: regular since ~0400, vaginal bleeding: none, membranes: intact.  Initiated prenatal care at [facility] at 11 wks.   Most recent u/s 4/4 @ 29.6.   This pregnancy complicated by: Cerclage placement (removed 5/30) d/t h/o cervical incompetence Recurrent pregnancy loss Hgb C SCT+  Prenatal History/Complications:  Term SVB x 2 25wk PTB x 1 Early SAB x 1 18wk SAB x 1 2 EAB  Past Medical History: Past Medical History:  Diagnosis Date   Anemia    Chlamydia 2010   Depression AGE 82   NO MEDS CURRENTLY   Gonorrhea 2010   Headache    Ovarian cyst 2013   Sickle cell trait (HCC)    Trichomonal vaginitis during pregnancy 01/19/2019   See on pap done at [facility] 01-2019   Trichomonas vaginalis infection 4/282015   Urinary tract infection    OCC    Past Surgical History: Past Surgical History:  Procedure Laterality Date   CERVICAL CERCLAGE N/A 11/24/2022   Procedure: CERCLAGE CERVICAL;  Surgeon: Tereso Newcomer, MD;  Location: [facility] LD ORS;  Service: Gynecology;  Laterality: N/A;   NO PAST SURGERIES      Obstetrical History: OB History  Gravida Para Term Preterm AB Living  8 3 2 1 4 3   SAB IAB Ectopic Multiple Live Births  2 2     3     # Outcome Date GA Lbr Len/2nd Weight Sex Delivery Anes PTL Lv  8 Current           7 SAB 12/11/20 [redacted]w[redacted]d         6 IAB 09/07/20          5 IAB 05/08/20 106w0d         4 Term 08/09/19 [redacted]w[redacted]d  3175 g F Vag-Spont  N LIV  3 SAB 06/06/18 [redacted]w[redacted]d         2 Preterm 04/19/14 [redacted]w[redacted]d 02:12 / 00:14 930 g F Vag-Spont None  LIV  1 Term 08/11/13 [redacted]w[redacted]d 03:23 / 00:26 2260 g M Vag-Spont EPI  LIV    Social History: Social History   Socioeconomic History   Marital status: Single    Spouse name: Not on file   Number of children: Not on file   Years of education: 11+   Highest education  level: Not on file  Occupational History   Not on file  Tobacco Use   Smoking status: Every Day    Packs/day: .25    Types: Cigarettes   Smokeless tobacco: Never  Vaping Use   Vaping Use: Never used  Substance and Sexual Activity   Alcohol use: Not Currently    Alcohol/week: 21.0 standard drinks of alcohol    Types: 21 Shots of liquor per week    Comment: 3 per day when not pregnant   Drug use: Yes    Frequency: 7.0 times per week    Types: Marijuana    Comment: daily   Sexual activity: Yes    Partners: Male    Birth control/protection: None  Other Topics Concern   Not on file  Social History Narrative   Not on file   Social Determinants of Health   Financial Resource Strain: Not on file  Food Insecurity: No Food Insecurity (05/13/2023)   Hunger Vital Sign    Worried About Running Out  of Food in the Last Year: Never true    Ran Out of Food in the Last Year: Never true  Transportation Needs: Unmet Transportation Needs (10/21/2022)   PRAPARE - Administrator, Civil Service (Medical): Yes    Lack of Transportation (Non-Medical): Yes  Physical Activity: Not on file  Stress: Not on file  Social Connections: Not on file    Allergies: Allergies  Allergen Reactions   Tramadol Hives and Rash    Entire body rash    Medications Prior to Admission  Medication Sig Dispense Refill Last Dose   acetaminophen (TYLENOL) 500 MG tablet Take 500-1,000 mg by mouth every 6 (six) hours as needed (pain.).   05/12/2023 at 1200   Prenatal Vit-Fe Fumarate-FA (PRENATAL PLUS VITAMIN/MINERAL) 27-1 MG TABS Take 1 tablet by mouth daily. 30 tablet 11 05/12/2023   docusate sodium (COLACE) 100 MG capsule Take 1 capsule (100 mg total) by mouth 2 (two) times daily as needed for mild constipation or moderate constipation. 30 capsule 2     Review of Systems  Pertinent pos/neg as indicated in HPI  Blood pressure 123/69, pulse 67, last menstrual period 08/15/2022. General appearance: alert,  cooperative, and moderate distress Lungs: clear to auscultation bilaterally Heart: regular rate and rhythm Abdomen: gravid, soft, non-tender Extremities: tr edema  Fetal monitoring: FHR: 135 bpm, variability: moderate,  Accelerations: Present,  decelerations:  Absent Uterine activity: q 2-3 mins SVE: Dilation: 8 Effacement (%): 100 Station: -2 Exam by:: Mercado-Ortiz, MD Presentation: cephalic  0559 SROM clear fluid w/ urge to push 0604: 8/100/-1 by Dr. Camelia Phenes  Prenatal labs: ABO, Rh: AB/Positive/-- (11/29 1557) Antibody: Negative (11/29 1557) Rubella: 1.96 (11/29 1557) RPR: Non Reactive (05/07 1608)  HBsAg: Negative (11/29 1557)  HIV: Non Reactive (05/07 1608)  HepC: Non Reactive (11/29 1557) GBS: Negative/-- (05/30 0442)  2hr GTT: normal  No results found for this or any previous visit (from the past 24 hour(s)).   Assessment:  [redacted]w[redacted]d SIUP  [id number]  Active labor  Cat 1 FHR  GBS Negative/-- (05/30 0442)  Plan:  Admit to L&D  IV pain meds/epidural prn active labor  Expectant management  Planned mode of delivery: NSVB   Planned infant feeding: breast and bottlefeeding  Planned contraception: undecided  Circumcision: yes  Cheral Marker CNM, WHNP-BC 05/13/2023, 6:13 AM

## 2023-05-13 NOTE — MAU Provider Note (Signed)
Dilation: 6.5 Effacement (%): 80 Station: -2 Presentation: Undeterminable Exam by:: Wynelle Bourgeois. CNM  Pt informed that the ultrasound is considered a limited OB ultrasound and is not intended to be a complete ultrasound exam.  Patient also informed that the ultrasound is not being completed with the intent of assessing for fetal or placental anomalies or any pelvic abnormalities.  Explained that the purpose of today's ultrasound is to assess for presentation.  Patient acknowledges the purpose of the exam and the limitations of the study.    Fetus is vertex

## 2023-05-14 NOTE — Clinical Social Work Maternal (Addendum)
CLINICAL SOCIAL WORK MATERNAL/CHILD NOTE  Patient Details  Name: LYNISHA OSUCH MRN: [id number] Date of Birth: Apr 26, 1996  Date:  05/14/2023  Clinical Social Worker Initiating Note:  Enos Fling Date/Time: Initiated:  05/14/23/1249     Child's Name:  Aundria Mems   Biological Parents:  Mother, Father Gurnoor Sloop and Bonner Puna)   Need for Interpreter:  None   Reason for Referral:  Behavioral Health Concerns, Current Substance Use/Substance Use During Pregnancy     Address:  [address] [place] [address]    Phone number:  [phone] (home)     Additional phone number:   Household Members/Support Persons (HM/SP):   Household Member/Support Person 1, Household Member/Support Person 2, Household Member/Support Person 3, Household Member/Support Person 4   HM/SP Name Relationship DOB or Age  HM/SP -1 Bonner Puna FOB 27 years old  HM/SP -2 Mariel Craft (living with dad) MOB's son 08-11-2013  HM/SP -3 Faith Jimmey Ralph (living with dad) MOB's daughter 04-19-2014  HM/SP -4 Rylan Brooke Dare (with Estrella Myrtle) MOB's dauhgter 08-09-2019  HM/SP -5        HM/SP -6        HM/SP -7        HM/SP -8          Natural Supports (not living in the home):  Immediate Family   Professional Supports: None   Employment: Unemployed   Type of Work:     Education:  9 to 11 years   Homebound arranged:    Surveyor, quantity Resources:  [employer]   Other Resources:  [employer], Sales executive     Cultural/Religious Considerations Which May Impact Care:    Strengths:  Merchandiser, retail, Home prepared for child     Psychotropic Medications:         Pediatrician:    Armed forces operational officer area  Pediatrician List:   [facility] ([address] [employer])  [place]    [place]      Pediatrician Fax Number:    Risk Factors/Current Problems:  Substance Use  , Mental Health Concerns     Cognitive  State:  Alert  , Able to Concentrate  , Goal Oriented  , Insightful  , Linear Thinking     Mood/Affect:  Interested  , Comfortable  , Calm  , Relaxed     CSW Assessment: CSW received a consult for THC use during pregnancy and depression. CSW met MOB at bedside to complete a full psychosocial assessment. CSW entered the room, introduced herself and explained the reason for the visit. MOB presented as calm, was agreeable to consult and remained engaged throughout encounter.  CSW collected MOB's demographic information and she reported not having custody of Neomia Glass (04-19-2014) and Mariel Craft (08-11-2013); they are currently living with dad. Rylan Brooke Dare (08-09-2019) is currently living with Estrella Myrtle, and MOB does have custody of child; this arrangement was made outside of court and denied any CPS history with all children. CSW inquired about MOB's mental health history. MOB reported being diagnosed with depression at the age of 50 or 27 years old due to family struggles. MOB reported being prescribed medication and participating in therapy in the past; however her mom was no longer requiring her to maintain these avenues of support. MOB reported currently is interested in therapy resources; CSW provided resources for [facility] specialized and general therapy list for support.  MOB  reported currently feeling "good" since giving birth and bonded well with infant. CSW provided education regarding the baby blues period vs. perinatal mood disorders, discussed treatment and gave resources for mental health follow up if concerns arise.  CSW recommends self-evaluation during the postpartum time period using the New Mom Checklist from Postpartum Progress and encouraged MOB to contact a medical professional if symptoms are noted at any time. CSW assessed for safety with MOB SI/HI/DV;MOB denied all.  CSW assessed for resources needs with MOB and she reported receiving food stamps and will follow up with [facility] for next  steps. MOB reported having all essential items for infant including a car seat and bassinet for safe sleeping. CSW provided review of Sudden Infant Death Syndrome (SIDS) precautions.  CSW informed MOB with the use THC during pregnancy the hospital will perform a UDS and CDS on infant. If the screenings return with positive results a report to CPS will be made. 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 MOB reported the reason for the use for these substances is; because of stress of and hanging around the wrong people.  CSW made a report with [place] due to urine positive for [facility], custody of children and use of cocaine.  Per Annamarie Major the report was not accepted.  CSW Plan/Description:  No Further Intervention Required/No Barriers to Discharge, Sudden Infant Death Syndrome (SIDS) Education, Perinatal Mood and Anxiety Disorder (PMADs) Education, Hospital Drug Screen Policy Information, Other Information/Referral to [facility], CSW Will Continue to Monitor Umbilical Cord Tissue Drug Screen Results and Make Report if Jena Gauss 05/14/2023, 12:53 PM

## 2023-05-14 NOTE — Progress Notes (Signed)
Post Partum Day 1 Subjective: Julia Roman  is a 27 y.o. [id number] s/p SVD at [redacted]w[redacted]d.  She reports she is doing well. No acute events overnight. She denies any problems with ambulating, voiding or po intake. Denies nausea or vomiting.  Pain is well controlled on ibuprofen.  Lochia is moderate and improving.   Objective: Blood pressure 117/73, pulse (!) 47, temperature 98 F (36.7 C), temperature source Oral, resp. rate 17, last menstrual period 08/15/2022, unknown if currently breastfeeding.  Physical Exam:  General: alert, cooperative, fatigued, and no distress Lochia: appropriate Uterine Fundus: firm Incision: n/a DVT Evaluation: No evidence of DVT seen on physical exam. Negative Homan's sign. No cords or calf tenderness. No significant calf/ankle edema.  Recent Labs    05/13/23 0603  HGB 12.6  HCT 35.1*    Assessment/Plan: Plan for discharge tomorrow, Breastfeeding, Lactation consult, Circumcision prior to discharge, and Contraception interval BTL   LOS: 1 day   Raelyn Mora, CNM 05/14/2023, 7:16 AM

## 2023-05-15 MED ORDER — IBUPROFEN 600 MG PO TABS: 600.0000 mg | ORAL_TABLET | Freq: Four times a day (QID) | ORAL | 0 refills | Status: AC

## 2023-05-20 ENCOUNTER — Encounter: Payer: Self-pay | Admitting: *Deleted

## 2023-06-02 ENCOUNTER — Telehealth (HOSPITAL_COMMUNITY): Payer: Self-pay

## 2023-06-02 ENCOUNTER — Ambulatory Visit: Payer: Medicaid Other | Admitting: Clinical

## 2023-06-02 DIAGNOSIS — Z91199 Patient's noncompliance with other medical treatment and regimen due to unspecified reason: Secondary | ICD-10-CM

## 2023-06-02 NOTE — Telephone Encounter (Signed)
06/02/2023 1427  Name: Julia Roman MRN: [id number] DOB: 02/10/1996  Reason for Call:  Transition of Care Hospital Discharge Call  Contact Status: Patient Contact Status: Message  Language assistant needed: Interpreter Mode: Interpreter Not Needed        Follow-Up Questions:    Inocente Salles Postnatal Depression Scale:  In the Past 7 Days:    PHQ2-9 Depression Scale:     Discharge Follow-up:    Post-discharge interventions: NA  Signature Signe Colt

## 2023-06-02 NOTE — BH Specialist Note (Signed)
Pt did not arrive to video visit and did not answer the phone; Left HIPPA-compliant message to call back Castin Donaghue from [facility] at  [phone] (Nashayla Telleria's office).  ?; left MyChart message for patient.  ? ?

## 2023-06-10 ENCOUNTER — Ambulatory Visit: Payer: Medicaid Other | Admitting: Obstetrics and Gynecology

## 2023-07-03 ENCOUNTER — Encounter: Payer: Self-pay | Admitting: Certified Nurse Midwife

## 2023-07-03 ENCOUNTER — Other Ambulatory Visit: Payer: Self-pay

## 2023-07-03 ENCOUNTER — Ambulatory Visit (INDEPENDENT_AMBULATORY_CARE_PROVIDER_SITE_OTHER): Payer: Medicaid Other | Admitting: Certified Nurse Midwife

## 2023-07-03 DIAGNOSIS — Z3009 Encounter for other general counseling and advice on contraception: Secondary | ICD-10-CM

## 2023-07-03 DIAGNOSIS — Z30017 Encounter for initial prescription of implantable subdermal contraceptive: Secondary | ICD-10-CM | POA: Diagnosis not present

## 2023-07-03 MED ORDER — ETONOGESTREL 68 MG ~~LOC~~ IMPL
68.0000 mg | DRUG_IMPLANT | Freq: Once | SUBCUTANEOUS | Status: AC
Start: 2023-07-03 — End: 2023-07-03
  Administered 2023-07-03: 68 mg via SUBCUTANEOUS

## 2023-07-03 NOTE — Progress Notes (Signed)
Post Partum Visit Note  Julia Roman is a 27 y.o. [phone] female who presents for a postpartum visit. She is  7.2  weeks postpartum following a normal spontaneous vaginal delivery.  I have fully reviewed the prenatal and intrapartum course. The delivery was at 38.5 gestational weeks.  Anesthesia:  IV Fentanyl x 1 . Postpartum course has been uncomplicated. Baby is doing well. Baby is feeding by bottle - Similac Alimentum . Bleeding red. Bowel function is normal. Bladder function is normal. Patient is not sexually active. Contraception method is Nexplanon. Postpartum depression screening: negative.   The pregnancy intention screening data noted above was reviewed. Potential methods of contraception were discussed. The patient elected to proceed with No data recorded.   Edinburgh Postnatal Depression Scale - 07/03/23 0830       Edinburgh Postnatal Depression Scale:  In the Past 7 Days   I have been able to laugh and see the funny side of things. 0    I have looked forward with enjoyment to things. 0    I have blamed myself unnecessarily when things went wrong. 0    I have been anxious or worried for no good reason. 0    I have felt scared or panicky for no good reason. 0    Things have been getting on top of me. 0    I have been so unhappy that I have had difficulty sleeping. 0    I have felt sad or miserable. 0    I have been so unhappy that I have been crying. 1    The thought of harming myself has occurred to me. 0    Edinburgh Postnatal Depression Scale Total 1             Health Maintenance Due  Topic Date Due   COVID-19 Vaccine (1 - 2023-24 season) Never done    The following portions of the patient's history were reviewed and updated as appropriate: allergies, current medications, past family history, past medical history, past social history, past surgical history, and problem list.  Review of Systems Pertinent items are noted in HPI.  Objective:  BP (!) 164/89    Pulse 79   Wt 115 lb 11.2 oz (52.5 kg)   LMP 08/15/2022 (Exact Date)   Breastfeeding No   BMI 21.86 kg/m    General:  alert and cooperative   Breasts:  not indicated  Lungs: clear to auscultation bilaterally  Heart:  regular rate and rhythm, S1, S2 normal, no murmur, click, rub or gallop  Abdomen: soft, non-tender; bowel sounds normal; no masses,  no organomegaly   Wound N/A   GU exam:  not indicated       Assessment:    1. Postpartum care and examination - Patient overall doing well.   2. Birth control counseling - She request birth control via Nexplanon today.   Elevated BPs today, but reviewed her previous history and no GHTN or cHTN. After further discussion patient admitted today smoking a cigarette just prior to arrival to visit. Reports smoking and or vaping 1-3 cigarettes per week.  States she has cut back significantly  in postpartum period.    Normal  postpartum exam.   Plan:   Essential components of care per ACOG recommendations:  1.  Mood and well being: Patient with negative depression screening today. Reviewed local resources for support.  - Patient tobacco use? Yes. Patient desires to quit? No.   - hx of drug use?  Yes. Discussed support systems and outpatient/inpatient treatment options.  Mariajuana use  2. Infant care and feeding:  -Patient currently breastmilk feeding? No.  -Social determinants of health (SDOH) reviewed in EPIC. No concerns  3. Sexuality, contraception and birth spacing - Patient does not want a pregnancy in the next year.  Desired family size is 4 children.  - Reviewed reproductive life planning. Reviewed contraceptive methods based on pt preferences and effectiveness.  Patient desired Hormonal Implant today.   - Discussed birth spacing of 18 months  4. Sleep and fatigue -Encouraged family/partner/community support of 4 hrs of uninterrupted sleep to help with mood and fatigue  5. Physical Recovery  - Discussed patients delivery  and complications. She describes her labor as good. - Patient had a Vaginal, no problems at delivery. Patient had no laceration. Perineal healing reviewed. Patient expressed understanding - Patient has urinary incontinence? No. - Patient is safe to resume physical and sexual activity at her desired time  6.  Health Maintenance - HM due items addressed Yes - Last pap smear  Diagnosis  Date Value Ref Range Status  05/07/2023   Final   - Negative for intraepithelial lesion or malignancy (NILM)   Pap smear not done at today's visit.  -Breast Cancer screening indicated? No.   7. Chronic Disease/Pregnancy Condition follow up: None  - PCP follow up  Claudette Head, CNM [facility]

## 2023-07-03 NOTE — Progress Notes (Signed)
GYNECOLOGY CLINIC PROCEDURE NOTE  Ms. Julia Roman is a 27 y.o. [phone] here for Nexplanon insertion. No GYN concerns.  Last pap smear was on 05/07/2023 and was normal.  No other gynecologic concerns.  Nexplanon Insertion Procedure Patient was given informed consent, she signed consent form.  Patient does understand that irregular bleeding is a very common side effect of this medication. She was advised to have backup contraception for one week after placement. Pregnancy test in clinic today was negative.  Appropriate time out taken.  Patient's left arm was prepped and draped in the usual sterile fashion.. The ruler used to measure and mark insertion area.  Patient was prepped with alcohol swab and then injected with 3 ml of 1% lidocaine.  She was prepped with betadine, Nexplanon removed from packaging,  Device confirmed in needle, then inserted full length of needle and withdrawn per handbook instructions. Nexplanon was able to palpated in the patient's arm; patient palpated the insert herself. There was minimal blood loss.  Patient insertion site covered with guaze and a pressure bandage to reduce any bruising.  The patient tolerated the procedure well and was given post procedure instructions.    Carlynn Herald, CNM 07/03/2023 11:01 PM

## 2024-04-25 ENCOUNTER — Encounter (HOSPITAL_COMMUNITY): Payer: Self-pay

## 2024-04-25 ENCOUNTER — Emergency Department (HOSPITAL_COMMUNITY)

## 2024-04-25 ENCOUNTER — Other Ambulatory Visit: Payer: Self-pay

## 2024-04-25 ENCOUNTER — Emergency Department (HOSPITAL_COMMUNITY)
Admission: EM | Admit: 2024-04-25 | Discharge: 2024-04-25 | Disposition: A | Discharge status: Home / Self Care | Attending: Emergency Medicine | Admitting: Emergency Medicine

## 2024-04-25 DIAGNOSIS — M7918 Myalgia, other site: Secondary | ICD-10-CM

## 2024-04-25 DIAGNOSIS — M25572 Pain in left ankle and joints of left foot: Secondary | ICD-10-CM | POA: Diagnosis not present

## 2024-04-25 DIAGNOSIS — F419 Anxiety disorder, unspecified: Secondary | ICD-10-CM | POA: Diagnosis present

## 2024-04-25 DIAGNOSIS — M79604 Pain in right leg: Secondary | ICD-10-CM | POA: Diagnosis not present

## 2024-04-25 DIAGNOSIS — M79651 Pain in right thigh: Secondary | ICD-10-CM | POA: Diagnosis not present

## 2024-04-25 MED ORDER — LORAZEPAM 1 MG PO TABS
1.0000 mg | ORAL_TABLET | Freq: Once | ORAL | Status: AC
  Administered 2024-04-25: 1 mg via ORAL
  Filled 2024-04-25: qty 1

## 2024-04-25 NOTE — ED Triage Notes (Signed)
 Pt arrives via EMS from home. PT arrives very tearful and anxious. PT reports she has been having very bad anxiety since this morning. She also c/o right leg pain after kicking a table this morning. PT is AxOx4. Denies SI or HI.

## 2024-04-25 NOTE — Discharge Instructions (Addendum)
 Today you were seen for anxiety and musculoskeletal pain.  You may take ibuprofen  and Tylenol  as needed for pain and inflammation.  Please follow-up with [facility] for further evaluation and treatment of your anxiety.  Thank you for letting us  treat you today. After reviewing your imaging, I feel you are safe to go home. Please follow up with your PCP in the next several days and provide them with your records from this visit. Return to the Emergency Room if pain becomes severe or symptoms worsen.

## 2024-04-25 NOTE — ED Notes (Signed)
 To xray via stretcher chair

## 2024-04-25 NOTE — ED Provider Notes (Signed)
 [place] EMERGENCY DEPARTMENT AT [facility] Provider Note   CSN: [id number] Arrival date & time: 04/25/24  1110     History  Chief Complaint  Patient presents with   Anxiety   Leg Pain    Julia Roman is a 28 y.o. female past medical history significant for depression presents today for anxiety and right leg pain after kicking a table this morning.  Patient denies HI, SI, or AVH.  Patient also reports right thigh and left ankle pain for which she says she hit on a table as well.   Anxiety  Leg Pain      Home Medications Prior to Admission medications   Medication Sig Start Date End Date Taking? Authorizing Provider  acetaminophen  (TYLENOL ) 500 MG tablet Take 500-1,000 mg by mouth every 6 (six) hours as needed (pain.).    [provider]  docusate sodium  (COLACE) 100 MG capsule Take 1 capsule (100 mg total) by mouth 2 (two) times daily as needed for mild constipation or moderate constipation. 11/24/22   Anyanwu, Ugonna A, MD  ibuprofen  (ADVIL ) 600 MG tablet Take 1 tablet (600 mg total) by mouth every 6 (six) hours. 05/15/23   Alric Jensen, CNM  Prenatal Vit-Fe Fumarate-FA (PRENATAL PLUS VITAMIN/MINERAL) 27-1 MG TABS Take 1 tablet by mouth daily. Patient not taking: Reported on 07/03/2023 10/21/22   Teena Feast, MD  Prenatal Vit-Fe Fumarate-FA (PRENATAL 19) 29-1 MG CHEW Chew 1 tablet by mouth daily. 10/01/22   Granville Layer, MD      Allergies    Tramadol    Review of Systems   Review of Systems  Musculoskeletal:  Positive for arthralgias.  Psychiatric/Behavioral:  The patient is nervous/anxious.     Physical Exam Updated Vital Signs BP (!) 121/96   Pulse 98   Temp 98.3 F (36.8 C)   Resp 20   SpO2 100%  Physical Exam Vitals and nursing note reviewed.  Constitutional:      General: She is not in acute distress.    Appearance: She is well-developed. She is not ill-appearing, toxic-appearing or diaphoretic.  HENT:     Head:  Normocephalic and atraumatic.  Eyes:     Conjunctiva/sclera: Conjunctivae normal.  Cardiovascular:     Rate and Rhythm: Normal rate and regular rhythm.     Heart sounds: No murmur heard. Pulmonary:     Effort: Pulmonary effort is normal. No respiratory distress.     Breath sounds: Normal breath sounds.  Abdominal:     Palpations: Abdomen is soft.     Tenderness: There is no abdominal tenderness.  Musculoskeletal:        General: Tenderness present. No swelling.     Cervical back: Neck supple.     Comments: Mild erythema to the mid right anterior lateral femur with tenderness to palpation.  Patient with tenderness to palpation over bilateral medial malleoli and minimal erythema to both.  No ecchymosis or warmth noted on exam  Skin:    General: Skin is warm and dry.     Capillary Refill: Capillary refill takes less than 2 seconds.  Neurological:     General: No focal deficit present.     Mental Status: She is alert.  Psychiatric:        Mood and Affect: Mood is anxious.     ED Results / Procedures / Treatments   Labs (all labs ordered are listed, but only abnormal results are displayed) Labs Reviewed - No data to display  EKG None  Radiology DG Ankle Complete Right Result Date: 04/25/2024 CLINICAL DATA:  Pain. EXAM: RIGHT ANKLE - COMPLETE 3+ VIEW COMPARISON:  None Available. FINDINGS: There is no evidence of fracture, dislocation, or joint effusion. The ankle mortise is preserved. There is no evidence of arthropathy or other focal bone abnormality. Soft tissues are unremarkable. IMPRESSION: Negative radiographs of the right ankle. Electronically Signed   By: Chadwick Colonel M.D.   On: 04/25/2024 13:47   DG Femur 1V Right Result Date: 04/25/2024 CLINICAL DATA:  Pain. EXAM: RIGHT FEMUR 1 VIEW COMPARISON:  None Available. FINDINGS: Divided frontal views of the femur submitted. There is no evidence of fracture on this single view. The cortical margins of the femur are intact. Knee  and hip alignment are maintained. Soft tissues are unremarkable. IMPRESSION: Negative AP view of the right femur. Electronically Signed   By: Chadwick Colonel M.D.   On: 04/25/2024 13:46   DG Ankle Complete Left Result Date: 04/25/2024 CLINICAL DATA:  Pain. EXAM: LEFT ANKLE COMPLETE - 3+ VIEW COMPARISON:  None Available. FINDINGS: There is no evidence of fracture, dislocation, or joint effusion. The ankle mortise is preserved. There is no evidence of arthropathy or other focal bone abnormality. Soft tissues are unremarkable. IMPRESSION: Negative radiographs of the left ankle. Electronically Signed   By: Chadwick Colonel M.D.   On: 04/25/2024 13:46    Procedures Procedures    Medications Ordered in ED Medications  LORazepam  (ATIVAN ) tablet 1 mg (1 mg Oral Given 04/25/24 1230)    ED Course/ Medical Decision Making/ A&P                                 Medical Decision Making Amount and/or Complexity of Data Reviewed Radiology: ordered.  Risk Prescription drug management.   This patient presents to the ED for concern of anxiety and arthralgias differential diagnosis includes fracture, musculoskeletal pain, anxiety, HI, SI   Imaging Studies ordered:  I ordered imaging studies including bilateral ankle x-rays and right femur x-ray I independently visualized and interpreted imaging which showed negative I agree with the radiologist interpretation   Medicines ordered and prescription drug management:  I ordered medication including Ativan  for anxiety Reevaluation of the patient after these medicines showed that the patient improved I have reviewed the patients home medicines and have made adjustments as needed   Problem List / ED Course:  Consider for admission or further workup however patient's vital signs, physical exam, and imaging of been reassuring.  Patient's symptoms likely due to anxiety and musculoskeletal pain.  Patient advised to follow-up with [facility] as soon as possible and to use Tylenol  and Motrin  as needed for pain and inflammation.  I feel patient is safe for discharge at this time.        Final Clinical Impression(s) / ED Diagnoses Final diagnoses:  Anxiety  Musculoskeletal pain    Rx / DC Orders ED Discharge Orders     None         Carie Charity, PA-C 04/25/24 1355    Iva Mariner, MD 04/25/24 [phone]

## 2024-09-24 ENCOUNTER — Ambulatory Visit (HOSPITAL_COMMUNITY)
Admission: EM | Admit: 2024-09-24 | Discharge: 2024-09-24 | Disposition: A | Discharge status: Home / Self Care | Attending: Physician Assistant | Admitting: Physician Assistant

## 2024-09-24 ENCOUNTER — Encounter (HOSPITAL_COMMUNITY): Payer: Self-pay | Admitting: Emergency Medicine

## 2024-09-24 DIAGNOSIS — H00022 Hordeolum internum right lower eyelid: Secondary | ICD-10-CM

## 2024-09-24 DIAGNOSIS — L03213 Periorbital cellulitis: Secondary | ICD-10-CM | POA: Diagnosis not present

## 2024-09-24 MED ORDER — ERYTHROMYCIN 5 MG/GM OP OINT: TOPICAL_OINTMENT | OPHTHALMIC | 0 refills | Status: AC

## 2024-09-24 MED ORDER — AMOXICILLIN-POT CLAVULANATE 875-125 MG PO TABS: 1.0000 | ORAL_TABLET | Freq: Two times a day (BID) | ORAL | 0 refills | Status: AC

## 2024-09-24 NOTE — ED Triage Notes (Signed)
 Pt st's yesterday she noticed a bump under right eye.  St's today eye is swollen and has been itching

## 2024-09-24 NOTE — Discharge Instructions (Signed)
 I believe that you have a stye that is causing significant swelling and infection around your eye.  Please use warm compresses a few times per day.  Apply erythromycin  ointment twice daily for 10 days.  Do not touch with the medication bottle to the eye and make sure to wash your hands before handling the medication to prevent contamination of the medicine.  Start Augmentin twice daily for 10 days.  Take Tylenol  and ibuprofen  for pain.  If your symptoms are not improving within a few days please follow-up with ophthalmology; call to schedule an appointment.  If anything worsens and you have vision change, fever, pain when you move your eyes, weakness, light sensitivity you should be seen immediately.

## 2024-09-24 NOTE — ED Provider Notes (Signed)
 [facility]    CSN: [id number] Arrival date & time: 09/24/24  1458      History   Chief Complaint Chief Complaint  Patient presents with   Eye Problem    HPI Julia Roman is a 28 y.o. female.   Patient presents today with a 36-hour history of right eye discomfort and swelling.  She initially had a gritty sensation in the eye but then started using Visine eyedrops and this resolved.  She denies any ocular trauma, visual disturbance, photophobia, fever, nausea, vomiting.  She has noticed some drainage from the eye particularly when she woke up this morning and then also noticed a significant swelling of her lower eyelid.  She denies any visual disturbance.  She denies any recent antibiotic use.  She is confident she is not pregnant and she is not actively breast-feeding.  She does not see an ophthalmologist regularly.  She does not wear glasses or contacts.  She is concerned because of the amount of swelling on her lower eyelid.  She denies any foreign body sensation.    Past Medical History:  Diagnosis Date   Anemia    Chlamydia 2010   Depression AGE 89   NO MEDS CURRENTLY   Gonorrhea 2010   Headache    Ovarian cyst 2013   Sickle cell trait    Trichomonal vaginitis during pregnancy 01/19/2019   See on pap done at [facility] 01-2019   Trichomonas vaginalis infection 4/282015   Urinary tract infection    OCC    Patient Active Problem List   Diagnosis Date Noted   Normal labor 05/13/2023   Cervical cerclage suture present, placed 11/24/22 11/24/2022   Cervical insufficiency in pregnancy, antepartum 11/04/2022   Supervision of high risk pregnancy, antepartum 10/21/2022   Recurrent pregnancy loss 12/11/2020   Nexplanon  insertion 08/09/2019   Sickle cell trait    History of preterm delivery 04/21/2014   History of depression 02/03/2013   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 Authorizing Provider  amoxicillin-clavulanate (AUGMENTIN) 875-125 MG tablet Take 1 tablet by mouth every 12 (twelve) hours. 09/24/24  Yes Belma Dyches K, PA-C  erythromycin  ophthalmic ointment Place a 1/2 inch ribbon of ointment into the lower eyelid of right eye twice a day for 10 days 09/24/24  Yes Shandee Jergens K, PA-C  acetaminophen  (TYLENOL ) 500 MG tablet Take 500-1,000 mg by mouth every 6 (six) hours as needed (pain.).    [provider]  ibuprofen  (ADVIL ) 600 MG tablet Take 1 tablet (600 mg total) by mouth every 6 (six) hours. 05/15/23   Antonetta Edsel CROME, CNM  Prenatal Vit-Fe Fumarate-FA (PRENATAL 19) 29-1 MG CHEW Chew 1 tablet by mouth  daily. 10/01/22   Fredirick Glenys RAMAN, MD    Family History Family History  Problem Relation Age of Onset   Hypertension Mother    Asthma Mother    Fibromyalgia Mother    Sickle cell trait Father    Asthma Brother    Cancer Maternal Aunt        MOUTH   Hypertension Maternal Aunt    Seizures Maternal Aunt    COPD Maternal Aunt    Heart disease Maternal Grandmother    Hyperlipidemia Maternal Grandmother    Hypertension Maternal Grandmother    Arthritis Maternal Grandfather    Diabetes Maternal Grandfather    Hypertension Maternal Grandfather    Kidney disease Maternal Grandfather    Stroke Maternal Grandfather    Diabetes Paternal Grandmother    Birth defects Cousin        CLEFT LIP   Cancer Cousin     Social History Social History   Tobacco Use   Smoking status: Every Day    Current packs/day: 0.25     Types: Cigarettes   Smokeless tobacco: Never  Vaping Use   Vaping status: Never Used  Substance Use Topics   Alcohol use: Yes    Alcohol/week: 21.0 standard drinks of alcohol    Types: 21 Shots of liquor per week    Comment: 3 per day when not pregnant   Drug use: Yes    Frequency: 7.0 times per week    Types: Marijuana    Comment: daily     Allergies   Tramadol   Review of Systems Review of Systems  Constitutional:  Negative for activity change, appetite change, fatigue and fever.  HENT:  Negative for congestion.   Eyes:  Positive for discharge. Negative for photophobia, pain, redness, itching and visual disturbance.  Respiratory:  Negative for cough.   Gastrointestinal:  Negative for nausea and vomiting.  Skin:  Positive for color change (erythema right eye). Negative for wound.  Neurological:  Negative for dizziness, light-headedness and headaches.     Physical Exam Triage Vital Signs ED Triage Vitals  Encounter Vitals Group     BP 09/24/24 1614 129/85     Girls Systolic BP Percentile --      Girls Diastolic BP Percentile --      Boys Systolic BP Percentile --      Boys Diastolic BP Percentile --      Pulse Rate 09/24/24 1614 88     Resp 09/24/24 1614 18     Temp 09/24/24 1614 98.4 F (36.9 C)     Temp Source 09/24/24 1614 Oral     SpO2 09/24/24 1614 99 %     Weight 09/24/24 1615 110 lb (49.9 kg)     Height 09/24/24 1615 5' 1 (1.549 m)     Head Circumference --      Peak Flow --      Pain Score 09/24/24 1614 6     Pain Loc --      Pain Education --      Exclude from Growth Chart --    No data found.  Updated Vital Signs BP 129/85 (BP Location: Left Arm)   Pulse 88   Temp 98.4 F (36.9 C) (Oral)   Resp 18   Ht 5' 1 (1.549 m)   Wt 110 lb (49.9 kg)   LMP  (Exact Date)   SpO2 99%   BMI 20.78 kg/m   Visual Acuity Right Eye Distance:   Left Eye Distance:  Bilateral Distance:    Right Eye Near:   Left Eye Near:    Bilateral Near:      Physical Exam Vitals reviewed.  Constitutional:      General: She is awake. She is not in acute distress.    Appearance: Normal appearance. She is well-developed. She is not ill-appearing.     Comments: Very pleasant female appears stated age in no acute distress sitting comfortably in exam room  HENT:     Head: Normocephalic and atraumatic.  Eyes:     General: Lids are everted, no foreign bodies appreciated.     Extraocular Movements: Extraocular movements intact.     Conjunctiva/sclera: Conjunctivae normal.     Pupils: Pupils are equal, round, and reactive to light.     Comments: Right eye: Erythematous papule noted conjunctiva lower lid.  Patient reports some discomfort with extraocular movements.  Drainage noted medial canthus of right eye but no significant injection of bulbar conjunctiva.  Cardiovascular:     Rate and Rhythm: Normal rate and regular rhythm.     Heart sounds: Normal heart sounds, S1 normal and S2 normal. No murmur heard. Pulmonary:     Effort: Pulmonary effort is normal.     Breath sounds: Normal breath sounds. No wheezing, rhonchi or rales.     Comments: Clear to auscultation bilaterally Lymphadenopathy:     Head:     Right side of head: No preauricular or posterior auricular adenopathy.     Left side of head: No preauricular or posterior auricular adenopathy.  Psychiatric:        Behavior: Behavior is cooperative.      UC Treatments / Results  Labs (all labs ordered are listed, but only abnormal results are displayed) Labs Reviewed - No data to display  EKG   Radiology No results found.  Procedures Procedures (including critical care time)  Medications Ordered in UC Medications - No data to display  Initial Impression / Assessment and Plan / UC Course  I have reviewed the triage vital signs and the nursing notes.  Pertinent labs & imaging results that were available during my care of the patient were reviewed by me and considered in my  medical decision making (see chart for details).     Patient is well-appearing, afebrile, nontoxic, nontachycardic.  Fluorescein staining was deferred as patient denied any foreign body sensation or eye pain.  She did have mild discomfort with extraocular movements though I suspect this is more related to swelling rather than true preseptal cellulitis.  Will cover with Augmentin given significant swelling and she is to take this twice daily for 10 days.  I am more concerned for significant hordeolum and so recommend that she use warm compresses as well as erythromycin  ointment twice daily.  No indication for Pseudomonas coverage as patient does not wear contacts.  We discussed that she should wash her hands before handling medication and avoid touching the tip of medication bottle to her eye as this can contaminate the medicine.  If her symptoms are not improving significantly with treatment plan she is to follow-up with ophthalmology and was given the contact information for local provider with instruction to call to schedule an appointment.  No indication for dose adjustment based on metabolic panel from 05/17/2022 with creatinine of 0.66 and calculated creatinine clearance of 99 mL/min; patient denies any risk factors for CKD.  Strict return precautions given.  All questions were answered to patient satisfaction.  Final Clinical Impressions(s) / UC Diagnoses  Final diagnoses:  Hordeolum internum of right lower eyelid  Preseptal cellulitis of right eye     Discharge Instructions      I believe that you have a stye that is causing significant swelling and infection around your eye.  Please use warm compresses a few times per day.  Apply erythromycin  ointment twice daily for 10 days.  Do not touch with the medication bottle to the eye and make sure to wash your hands before handling the medication to prevent contamination of the medicine.  Start Augmentin twice daily for 10 days.  Take Tylenol  and  ibuprofen  for pain.  If your symptoms are not improving within a few days please follow-up with ophthalmology; call to schedule an appointment.  If anything worsens and you have vision change, fever, pain when you move your eyes, weakness, light sensitivity you should be seen immediately.     ED Prescriptions     Medication Sig Dispense Auth. Provider   amoxicillin-clavulanate (AUGMENTIN) 875-125 MG tablet Take 1 tablet by mouth every 12 (twelve) hours. 20 tablet Jahnae Mcadoo K, PA-C   erythromycin  ophthalmic ointment Place a 1/2 inch ribbon of ointment into the lower eyelid of right eye twice a day for 10 days 3.5 g Benjy Kana K, PA-C      PDMP not reviewed this encounter.   Sherrell Rocky POUR, PA-C 09/24/24 1641

## 2024-09-27 ENCOUNTER — Emergency Department (HOSPITAL_COMMUNITY)
Admission: EM | Admit: 2024-09-27 | Discharge: 2024-09-27 | Disposition: A | Discharge status: Home / Self Care | Attending: Emergency Medicine | Admitting: Emergency Medicine

## 2024-09-27 ENCOUNTER — Other Ambulatory Visit: Payer: Self-pay

## 2024-09-27 DIAGNOSIS — E876 Hypokalemia: Secondary | ICD-10-CM | POA: Insufficient documentation

## 2024-09-27 DIAGNOSIS — E861 Hypovolemia: Secondary | ICD-10-CM

## 2024-09-27 DIAGNOSIS — M79602 Pain in left arm: Secondary | ICD-10-CM | POA: Diagnosis present

## 2024-09-27 DIAGNOSIS — E869 Volume depletion, unspecified: Secondary | ICD-10-CM | POA: Diagnosis not present

## 2024-09-27 DIAGNOSIS — D72829 Elevated white blood cell count, unspecified: Secondary | ICD-10-CM | POA: Insufficient documentation

## 2024-09-27 LAB — RESP PANEL BY RT-PCR (RSV, FLU A&B, COVID)  RVPGX2
Influenza A by PCR: NEGATIVE
Influenza B by PCR: NEGATIVE
Resp Syncytial Virus by PCR: NEGATIVE
SARS Coronavirus 2 by RT PCR: NEGATIVE

## 2024-09-27 LAB — URINALYSIS, ROUTINE W REFLEX MICROSCOPIC
Bilirubin Urine: NEGATIVE
Glucose, UA: NEGATIVE mg/dL
Hgb urine dipstick: NEGATIVE
Ketones, ur: NEGATIVE mg/dL
Leukocytes,Ua: NEGATIVE
Nitrite: NEGATIVE
Protein, ur: NEGATIVE mg/dL
Specific Gravity, Urine: 1.005 (ref 1.005–1.030)
pH: 6 (ref 5.0–8.0)

## 2024-09-27 LAB — CBC WITH DIFFERENTIAL/PLATELET
Abs Immature Granulocytes: 0.04 K/uL (ref 0.00–0.07)
Basophils Absolute: 0.1 K/uL (ref 0.0–0.1)
Basophils Relative: 1 %
Eosinophils Absolute: 0.6 K/uL — ABNORMAL HIGH (ref 0.0–0.5)
Eosinophils Relative: 5 %
HCT: 36.6 % (ref 36.0–46.0)
Hemoglobin: 12.4 g/dL (ref 12.0–15.0)
Immature Granulocytes: 0 %
Lymphocytes Relative: 21 %
Lymphs Abs: 2.8 K/uL (ref 0.7–4.0)
MCH: 25.1 pg — ABNORMAL LOW (ref 26.0–34.0)
MCHC: 33.9 g/dL (ref 30.0–36.0)
MCV: 74.1 fL — ABNORMAL LOW (ref 80.0–100.0)
Monocytes Absolute: 1.4 K/uL — ABNORMAL HIGH (ref 0.1–1.0)
Monocytes Relative: 10 %
Neutro Abs: 8.3 K/uL — ABNORMAL HIGH (ref 1.7–7.7)
Neutrophils Relative %: 63 %
Platelets: 351 K/uL (ref 150–400)
RBC: 4.94 MIL/uL (ref 3.87–5.11)
RDW: 15.7 % — ABNORMAL HIGH (ref 11.5–15.5)
WBC: 13.2 K/uL — ABNORMAL HIGH (ref 4.0–10.5)
nRBC: 0 % (ref 0.0–0.2)

## 2024-09-27 LAB — BASIC METABOLIC PANEL WITH GFR
Anion gap: 15 (ref 5–15)
BUN: 7 mg/dL (ref 6–20)
CO2: 23 mmol/L (ref 22–32)
Calcium: 9.4 mg/dL (ref 8.9–10.3)
Chloride: 100 mmol/L (ref 98–111)
Creatinine, Ser: 0.79 mg/dL (ref 0.44–1.00)
GFR, Estimated: >60 mL/min (ref >60)
Glucose, Bld: 95 mg/dL (ref 70–99)
Potassium: 3 mmol/L — ABNORMAL LOW (ref 3.5–5.1)
Sodium: 138 mmol/L (ref 135–145)

## 2024-09-27 LAB — TROPONIN T, HIGH SENSITIVITY: Troponin T High Sensitivity: <15 ng/L (ref 0–19)

## 2024-09-27 LAB — HCG, SERUM, QUALITATIVE: Preg, Serum: NEGATIVE

## 2024-09-27 LAB — MAGNESIUM: Magnesium: 1.6 mg/dL — ABNORMAL LOW (ref 1.7–2.4)

## 2024-09-27 MED ORDER — POTASSIUM CHLORIDE CRYS ER 20 MEQ PO TBCR
40.0000 meq | EXTENDED_RELEASE_TABLET | Freq: Once | ORAL | Status: AC
  Administered 2024-09-27: 40 meq via ORAL
  Filled 2024-09-27: qty 2

## 2024-09-27 MED ORDER — SODIUM CHLORIDE 0.9 % IV BOLUS
1000.0000 mL | Freq: Once | INTRAVENOUS | Status: AC
  Administered 2024-09-27: 1000 mL via INTRAVENOUS

## 2024-09-27 MED ORDER — POTASSIUM CHLORIDE CRYS ER 20 MEQ PO TBCR: 20.0000 meq | EXTENDED_RELEASE_TABLET | Freq: Two times a day (BID) | ORAL | 0 refills | Status: AC

## 2024-09-27 MED ORDER — MAGNESIUM SULFATE 2 GM/50ML IV SOLN
2.0000 g | Freq: Once | INTRAVENOUS | Status: AC
  Administered 2024-09-27: 2 g via INTRAVENOUS
  Filled 2024-09-27: qty 50

## 2024-09-27 NOTE — ED Provider Notes (Signed)
 [place] [facility]GENCY DEPARTMENT AT [facility] Provider Note   CSN: [id number] Arrival date & time: 09/27/24  1205     Patient presents with: Extremity Pain   Julia Roman is a 28 y.o. female who presents to the ED with primary complaint of left arm pain.  States that she has spasms in the left bicep, does report cocaine use last night, denies any IV drug use.  Initially had heart rate at triage at 125 however this is decreased to within normal limits at this time.  Pain is mainly a cramping type sensation that she states is intermittent in the left upper arm and in the flexor surface of the left elbow, does have Nexplanon  implant in the left arm.    Extremity Pain       Prior to Admission medications   Medication Sig Start Date End Date Taking? Authorizing Provider  potassium chloride SA (KLOR-CON M) 20 MEQ tablet Take 1 tablet (20 mEq total) by mouth 2 (two) times daily for 5 days. 09/27/24 10/02/24 Yes Myriam Dorn BROCKS, PA  acetaminophen  (TYLENOL ) 500 MG tablet Take 500-1,000 mg by mouth every 6 (six) hours as needed (pain.).    [provider]  amoxicillin-clavulanate (AUGMENTIN) 875-125 MG tablet Take 1 tablet by mouth every 12 (twelve) hours. 09/24/24   Raspet, Erin K, PA-C  erythromycin  ophthalmic ointment Place a 1/2 inch ribbon of ointment into the lower eyelid of right eye twice a day for 10 days 09/24/24   Raspet, Rocky POUR, PA-C  ibuprofen  (ADVIL ) 600 MG tablet Take 1 tablet (600 mg total) by mouth every 6 (six) hours. 05/15/23   Antonetta Edsel CROME, CNM  Prenatal Vit-Fe Fumarate-FA (PRENATAL 19) 29-1 MG CHEW Chew 1 tablet by mouth daily. 10/01/22   Fredirick Glenys RAMAN, MD    Allergies: Tramadol    Review of Systems  Musculoskeletal:  Positive for arthralgias and myalgias.  All other systems reviewed and are negative.   Updated Vital Signs BP 126/81 (BP Location: Left Arm)   Pulse 91   Temp 98.3 F (36.8 C) (Oral)   Resp 18   LMP  (Exact Date)    SpO2 96%   Physical Exam Vitals and nursing note reviewed.  Constitutional:      General: She is not in acute distress.    Appearance: Normal appearance.  HENT:     Head: Normocephalic and atraumatic.     Mouth/Throat:     Mouth: Mucous membranes are moist.     Pharynx: Oropharynx is clear.  Eyes:     Extraocular Movements: Extraocular movements intact.     Conjunctiva/sclera: Conjunctivae normal.     Pupils: Pupils are equal, round, and reactive to light.  Cardiovascular:     Rate and Rhythm: Normal rate and regular rhythm.     Pulses: Normal pulses.     Heart sounds: Normal heart sounds. No murmur heard.    No friction rub. No gallop.  Pulmonary:     Effort: Pulmonary effort is normal.     Breath sounds: Normal breath sounds.  Abdominal:     General: Abdomen is flat. Bowel sounds are normal.     Palpations: Abdomen is soft.  Musculoskeletal:        General: Normal range of motion.     Right upper arm: Normal.     Left upper arm: Normal.     Right elbow: Normal.     Left elbow: Normal.     Right forearm: Normal.  Left forearm: Normal.     Cervical back: Normal range of motion and neck supple.     Right lower leg: No edema.     Left lower leg: No edema.  Skin:    General: Skin is warm and dry.     Capillary Refill: Capillary refill takes less than 2 seconds.  Neurological:     General: No focal deficit present.     Mental Status: She is alert and oriented to person, place, and time. Mental status is at baseline.  Psychiatric:        Mood and Affect: Mood normal.        Behavior: Behavior normal.        Thought Content: Thought content normal.     (all labs ordered are listed, but only abnormal results are displayed) Labs Reviewed  CBC WITH DIFFERENTIAL/PLATELET - Abnormal; Notable for the following components:      Result Value   WBC 13.2 (*)    MCV 74.1 (*)    MCH 25.1 (*)    RDW 15.7 (*)    Neutro Abs 8.3 (*)    Monocytes Absolute 1.4 (*)     Eosinophils Absolute 0.6 (*)    All other components within normal limits  BASIC METABOLIC PANEL WITH GFR - Abnormal; Notable for the following components:   Potassium 3.0 (*)    All other components within normal limits  MAGNESIUM  - Abnormal; Notable for the following components:   Magnesium  1.6 (*)    All other components within normal limits  RESP PANEL BY RT-PCR (RSV, FLU A&B, COVID)  RVPGX2  HCG, SERUM, QUALITATIVE  URINALYSIS, ROUTINE W REFLEX MICROSCOPIC  TROPONIN T, HIGH SENSITIVITY    EKG: EKG Interpretation Date/Time:  Tuesday September 27 2024 12:44:54 EDT Ventricular Rate:  87 PR Interval:  151 QRS Duration:  102 QT Interval:  404 QTC Calculation: 486 R Axis:   83  Text Interpretation: Sinus rhythm Probable left ventricular hypertrophy Borderline prolonged QT interval Confirmed by Freddi Hamilton [phone]) on 09/27/2024 7:00:41 PM  Radiology: No results found.   Procedures   Medications Ordered in the ED  potassium chloride SA (KLOR-CON M) CR tablet 40 mEq (40 mEq Oral Given 09/27/24 1659)  sodium chloride  0.9 % bolus 1,000 mL (0 mLs Intravenous Stopped 09/27/24 1814)  magnesium  sulfate IVPB 2 g 50 mL (2 g Intravenous New Bag/Given 09/27/24 1819)                                    Medical Decision Making Amount and/or Complexity of Data Reviewed Labs: ordered.  Risk Prescription drug management.   Medical Decision Making:   Julia Roman is a 28 y.o. female who presented to the ED today with left arm cramping that has been going on since this morning.  Detailed above.     Complete initial physical exam performed, notably the patient  was alert and oriented in no apparent distress.  She does have some pain in the left bicep, however this is nontender and there is no surrounding erythema or edema that is appreciated on either arm..    Reviewed and confirmed nursing documentation for past medical history, family history, social history.    Initial  Assessment:   With the patient's presentation of arm pain, consider possible cellulitis, also consider electrolyte abnormality causing muscle cramping.  Initial Plan:  Provide IV fluid bolus secondary to clinical signs  of fluid volume deficit. Screening labs including CBC and Metabolic panel to evaluate for infectious or metabolic etiology of disease.  Urinalysis with reflex culture ordered to evaluate for UTI or relevant urologic/nephrologic pathology.  Evaluate troponin secondary to left-sided arm pain, also evaluate EKG for same. Obtain nasopharyngeal swab to rule out viral etiology. Objective evaluation as below reviewed   Initial Study Results:   Laboratory  All laboratory results reviewed without evidence of clinically relevant pathology.   Exceptions include: She has a hypokalemia of 3.0 along with low magnesium  of 1.6.  Leukocytosis is noted at 13.2.  EKG EKG was reviewed independently. Rate, rhythm, axis, intervals all examined and without medically relevant abnormality. ST segments without concerns for elevations.    Reassessment and Plan:   Provided replenishment for potassium and for magnesium , after fluid bolus patient's states that she has had significant improvement.  Given this reassuring finding along with workup showing no other concerning etiology of her left arm pain, plan at this time is to discharge the patient with outpatient follow-up to her primary care.  Further discussed substance abuse with the patient and the need for counseling assistance to facilitate this.  She is accepting of resources and these will be provided to her upon discharge.  Otherwise on reassessment, patient appears stable, has no other concerning findings on her evaluation, and will continue with plan to discharge with outpatient follow-up.  Will prescribe a course of oral potassium tablets to continue potassium repletion.  Primary care follow-up needed to reevaluate potassium levels within 2 weeks.        Final diagnoses:  Hypokalemia  Hypomagnesemia  Fluid volume deficit  Left arm pain    ED Discharge Orders          Ordered    potassium chloride SA (KLOR-CON M) 20 MEQ tablet  2 times daily        09/27/24 2002               Myriam Dorn BROCKS, [place] 09/27/24 2006    Randol Simmonds, MD 09/28/24 [phone]

## 2024-09-27 NOTE — Discharge Instructions (Addendum)
 Please be sure to increase your fluid intake, and be sure to follow-up with your primary care provider within the next week to 2 weeks.

## 2024-09-27 NOTE — ED Triage Notes (Signed)
 BIBA for left side swelling, spasms in her left bicep. Pt reports some cocaine use last night. 125 initial HR, 97 now

## 2024-09-27 NOTE — ED Provider Triage Note (Signed)
 Emergency Medicine Provider Triage Evaluation Note  BELA BONAPARTE , a 28 y.o. female  was evaluated in triage.  Pt complains of L bicep spasms. Endorses using cocaine, alcohol,  and marijuana 2 hours ago. Episodes are intermittent.   Endorses cough.  Denies IV drug use.   Denies fever, headache, vision changes, vertigo, chest pain, shortness of breath, abdominal pain, n/v, numbness, tingling, dysuria.   Review of Systems  Positive: N/a Negative: N/a  Physical Exam  BP (!) 139/101   Pulse (!) 103   Temp 99.2 F (37.3 C) (Oral)   Resp 16   LMP  (Exact Date)   SpO2 100%  Gen:   Awake, no distress   Resp:  Normal effort  MSK:   Moves extremities without difficulty  Other:    Medical Decision Making  Medically screening exam initiated at 12:35 PM.  Appropriate orders placed.  Ambreen CORAYMA CASHATT was informed that the remainder of the evaluation will be completed by another provider, this initial triage assessment does not replace that evaluation, and the importance of remaining in the ED until their evaluation is complete.     Beola Terrall RAMAN, [place] 09/27/24 1238
